# Patient Record
Sex: Female | Born: 1935 | Race: White | Hispanic: No | Smoking: Never smoker
Health system: Southern US, Community
[De-identification: ages and names within clinical notes are randomized; demographics above are authoritative.]

## PROBLEM LIST (undated history)

## (undated) DIAGNOSIS — G479 Sleep disorder, unspecified: Secondary | ICD-10-CM

## (undated) DIAGNOSIS — I10 Essential (primary) hypertension: Secondary | ICD-10-CM

## (undated) DIAGNOSIS — C50919 Malignant neoplasm of unspecified site of unspecified female breast: Secondary | ICD-10-CM

## (undated) DIAGNOSIS — Z923 Personal history of irradiation: Secondary | ICD-10-CM

## (undated) DIAGNOSIS — Z9221 Personal history of antineoplastic chemotherapy: Secondary | ICD-10-CM

## (undated) DIAGNOSIS — K219 Gastro-esophageal reflux disease without esophagitis: Secondary | ICD-10-CM

## (undated) DIAGNOSIS — F0280 Dementia in other diseases classified elsewhere without behavioral disturbance: Secondary | ICD-10-CM

## (undated) HISTORY — DX: Malignant neoplasm of unspecified site of unspecified female breast: C50.919

## (undated) HISTORY — DX: Gastro-esophageal reflux disease without esophagitis: K21.9

## (undated) HISTORY — DX: Dementia in other diseases classified elsewhere, unspecified severity, without behavioral disturbance, psychotic disturbance, mood disturbance, and anxiety: F02.80

## (undated) HISTORY — DX: Sleep disorder, unspecified: G47.9

## (undated) HISTORY — PX: BREAST EXCISIONAL BIOPSY: SUR124

## (undated) HISTORY — PX: BREAST BIOPSY: SHX20

---

## 2001-09-23 HISTORY — PX: BREAST EXCISIONAL BIOPSY: SUR124

## 2011-01-28 ENCOUNTER — Inpatient Hospital Stay: Payer: Self-pay | Admitting: Specialist

## 2011-02-05 DIAGNOSIS — I10 Essential (primary) hypertension: Secondary | ICD-10-CM

## 2011-02-05 DIAGNOSIS — M810 Age-related osteoporosis without current pathological fracture: Secondary | ICD-10-CM

## 2011-05-28 ENCOUNTER — Ambulatory Visit: Payer: Self-pay | Admitting: Internal Medicine

## 2012-01-20 ENCOUNTER — Ambulatory Visit: Payer: Self-pay | Admitting: Internal Medicine

## 2012-03-27 ENCOUNTER — Emergency Department: Payer: Self-pay | Admitting: *Deleted

## 2012-10-28 ENCOUNTER — Ambulatory Visit: Payer: Self-pay | Admitting: Hematology and Oncology

## 2012-10-28 LAB — COMPREHENSIVE METABOLIC PANEL
Albumin: 4 g/dL (ref 3.4–5.0)
Alkaline Phosphatase: 142 U/L — ABNORMAL HIGH (ref 50–136)
Anion Gap: 8 (ref 7–16)
BUN: 16 mg/dL (ref 7–18)
Bilirubin,Total: 0.4 mg/dL (ref 0.2–1.0)
Calcium, Total: 10 mg/dL (ref 8.5–10.1)
Chloride: 98 mmol/L (ref 98–107)
Co2: 33 mmol/L — ABNORMAL HIGH (ref 21–32)
Creatinine: 0.74 mg/dL (ref 0.60–1.30)
EGFR (African American): >60
EGFR (Non-African Amer.): >60
Glucose: 105 mg/dL — ABNORMAL HIGH (ref 65–99)
Osmolality: 279 (ref 275–301)
Potassium: 3.9 mmol/L (ref 3.5–5.1)
SGOT(AST): 26 U/L (ref 15–37)
SGPT (ALT): 28 U/L (ref 12–78)
Sodium: 139 mmol/L (ref 136–145)
Total Protein: 8.2 g/dL (ref 6.4–8.2)

## 2012-10-28 LAB — CBC CANCER CENTER
Basophil #: 0 x10 3/mm (ref 0.0–0.1)
Basophil %: 0.4 %
Eosinophil #: 0.2 x10 3/mm (ref 0.0–0.7)
Eosinophil %: 2.7 %
HCT: 41.4 % (ref 35.0–47.0)
HGB: 14.1 g/dL (ref 12.0–16.0)
Lymphocyte #: 1.4 x10 3/mm (ref 1.0–3.6)
Lymphocyte %: 23.9 %
MCH: 31.1 pg (ref 26.0–34.0)
MCHC: 33.9 g/dL (ref 32.0–36.0)
MCV: 92 fL (ref 80–100)
Monocyte #: 0.4 x10 3/mm (ref 0.2–0.9)
Monocyte %: 7.1 %
Neutrophil #: 3.8 x10 3/mm (ref 1.4–6.5)
Neutrophil %: 65.9 %
Platelet: 200 x10 3/mm (ref 150–440)
RBC: 4.52 10*6/uL (ref 3.80–5.20)
RDW: 13.6 % (ref 11.5–14.5)
WBC: 5.7 x10 3/mm (ref 3.6–11.0)

## 2012-11-03 ENCOUNTER — Ambulatory Visit: Payer: Self-pay | Admitting: Hematology and Oncology

## 2012-11-21 ENCOUNTER — Ambulatory Visit: Payer: Self-pay | Admitting: Hematology and Oncology

## 2013-01-20 ENCOUNTER — Ambulatory Visit: Payer: Self-pay | Admitting: Ophthalmology

## 2013-01-20 LAB — POTASSIUM: Potassium: 3.5 mmol/L (ref 3.5–5.1)

## 2013-02-02 ENCOUNTER — Ambulatory Visit: Payer: Self-pay | Admitting: Ophthalmology

## 2013-02-18 ENCOUNTER — Ambulatory Visit: Payer: Self-pay | Admitting: Ophthalmology

## 2013-02-18 LAB — POTASSIUM: Potassium: 3.4 mmol/L — ABNORMAL LOW (ref 3.5–5.1)

## 2013-03-02 ENCOUNTER — Ambulatory Visit: Payer: Self-pay | Admitting: Ophthalmology

## 2013-03-11 ENCOUNTER — Ambulatory Visit: Payer: Self-pay | Admitting: Internal Medicine

## 2014-04-06 ENCOUNTER — Ambulatory Visit: Payer: Self-pay | Admitting: Family Medicine

## 2015-01-13 NOTE — Op Note (Signed)
PATIENT NAME:  Stephanie Davidson, Stephanie Davidson MR#:  [id number] DATE OF BIRTH:  05-26-1931  DATE OF PROCEDURE:  02/02/2013  PREOPERATIVE DIAGNOSIS: Visually significant cataract of the left eye.   POSTOPERATIVE DIAGNOSIS: Visually significant cataract of the left eye.   OPERATIVE PROCEDURE: Cataract extraction by phacoemulsification with implant of intraocular lens to left eye.   SURGEON: Birder Robson, MD.   ANESTHESIA:  1. Managed anesthesia care.  2. Topical tetracaine drops followed by 2% Xylocaine jelly applied in the preoperative holding area.   COMPLICATIONS: None.   TECHNIQUE:  Stop and chop.    DESCRIPTION OF PROCEDURE: The patient was examined and consented in the preoperative holding area where the aforementioned topical anesthesia was applied to the left eye and then brought back to the Operating Room where the left eye was prepped and draped in the usual sterile ophthalmic fashion and a lid speculum was placed. A paracentesis was created with the side port blade and the anterior chamber was filled with viscoelastic. A near clear corneal incision was performed with the steel keratome. A continuous curvilinear capsulorrhexis was performed with a cystotome followed by the capsulorrhexis forceps. Hydrodissection and hydrodelineation were carried out with BSS on a blunt cannula. The lens was removed in a stop and chop technique and the remaining cortical material was removed with the irrigation-aspiration handpiece. The capsular bag was inflated with viscoelastic and the Tecnis ZCB00 22.5-diopter lens, serial number [id number], was placed in the capsular bag without complication. The remaining viscoelastic was removed from the eye with the irrigation-aspiration handpiece. The wounds were hydrated. The anterior chamber was flushed with Miostat and the eye was inflated to physiologic pressure. 0.1 mL of cefuroxime concentration 10 mg/mL was placed in the anterior chamber. The wounds were found to be water  tight. The eye was dressed with Vigamox. The patient was given protective glasses to wear throughout the day and a shield with which to sleep tonight. The patient was also given drops with which to begin a drop regimen today and will follow-up with me in one day.    ____________________________ Livingston Diones. Noheli Melder, MD wlp:gb D: 02/02/2013 20:55:08 ET T: 02/02/2013 21:34:07 ET JOB#: [id number]  cc: Zenita Kister L. Cleda Imel, MD, <Dictator> Livingston Diones Francess Mullen MD ELECTRONICALLY SIGNED 02/05/2013 9:00

## 2015-01-13 NOTE — Op Note (Signed)
PATIENT NAME:  Stephanie Davidson, Stephanie Davidson MR#:  [id number] DATE OF BIRTH:  10-15-1930  DATE OF PROCEDURE:  03/02/2013  PREOPERATIVE DIAGNOSIS: Visually significant cataract of the right eye.   POSTOPERATIVE DIAGNOSIS: Visually significant cataract of the right eye.   OPERATIVE PROCEDURE: Cataract extraction by phacoemulsification with implant of intraocular lens to right eye.   SURGEON: Birder Robson, MD.   ANESTHESIA:  1. Managed anesthesia care.  2. Topical tetracaine drops followed by 2% Xylocaine jelly applied in the preoperative holding area.   COMPLICATIONS: None.   TECHNIQUE: Stop and chop.  DESCRIPTION OF PROCEDURE: The patient was examined and consented in the preoperative holding area where the aforementioned topical anesthesia was applied to the right eye and then brought back to the Operating Room where the right eye was prepped and draped in the usual sterile ophthalmic fashion and a lid speculum was placed. A paracentesis was created with the side port blade and the anterior chamber was filled with viscoelastic. A near clear corneal incision was performed with the steel keratome. A continuous curvilinear capsulorrhexis was performed with a cystotome followed by the capsulorrhexis forceps. Hydrodissection and hydrodelineation were carried out with BSS on a blunt cannula. The lens was removed in a stop and chop technique and the remaining cortical material was removed with the irrigation-aspiration handpiece. The capsular bag was inflated with viscoelastic and the Tecnis ZCB00 22.0-diopter lens, serial number [id number], was placed in the capsular bag without complication. The remaining viscoelastic was removed from the eye with the irrigation-aspiration handpiece. The wounds were hydrated. The anterior chamber was flushed with Miostat and the eye was inflated to physiologic pressure. 0.1 mL of cefuroxime concentration 10 mg/mL was placed in the anterior chamber. The wounds were found to be  water tight. The eye was dressed with Vigamox. The patient was given protective glasses to wear throughout the day and a shield with which to sleep tonight. The patient was also given drops with which to begin a drop regimen today and will follow-up with me in one day.    ____________________________ Livingston Diones. Thoms Barthelemy, MD wlp:jm D: 03/02/2013 16:34:22 ET T: 03/02/2013 19:59:36 ET JOB#: [id number]  cc: Coady Train L. Aurianna Earlywine, MD, <Dictator> Livingston Diones Aedon Deason MD ELECTRONICALLY SIGNED 03/03/2013 12:50

## 2015-10-16 ENCOUNTER — Emergency Department: Payer: Medicare Other

## 2015-10-16 ENCOUNTER — Encounter: Payer: Self-pay | Admitting: Emergency Medicine

## 2015-10-16 ENCOUNTER — Emergency Department
Admission: EM | Admit: 2015-10-16 | Discharge: 2015-10-16 | Disposition: A | Discharge status: Home / Self Care | Payer: Medicare Other | Attending: Emergency Medicine | Admitting: Emergency Medicine

## 2015-10-16 DIAGNOSIS — S0181XA Laceration without foreign body of other part of head, initial encounter: Secondary | ICD-10-CM | POA: Diagnosis present

## 2015-10-16 DIAGNOSIS — S0083XA Contusion of other part of head, initial encounter: Secondary | ICD-10-CM

## 2015-10-16 DIAGNOSIS — I10 Essential (primary) hypertension: Secondary | ICD-10-CM | POA: Insufficient documentation

## 2015-10-16 DIAGNOSIS — Y9389 Activity, other specified: Secondary | ICD-10-CM | POA: Diagnosis not present

## 2015-10-16 DIAGNOSIS — Y998 Other external cause status: Secondary | ICD-10-CM | POA: Diagnosis not present

## 2015-10-16 DIAGNOSIS — W19XXXA Unspecified fall, initial encounter: Secondary | ICD-10-CM

## 2015-10-16 DIAGNOSIS — S80211A Abrasion, right knee, initial encounter: Secondary | ICD-10-CM | POA: Insufficient documentation

## 2015-10-16 DIAGNOSIS — Y92009 Unspecified place in unspecified non-institutional (private) residence as the place of occurrence of the external cause: Secondary | ICD-10-CM | POA: Diagnosis not present

## 2015-10-16 HISTORY — DX: Essential (primary) hypertension: I10

## 2015-10-16 MED ORDER — LIDOCAINE HCL (PF) 1 % IJ SOLN
5.0000 mL | Freq: Once | INTRAMUSCULAR | Status: AC
  Administered 2015-10-16: 5 mL

## 2015-10-16 MED ORDER — BACITRACIN ZINC 500 UNIT/GM EX OINT
TOPICAL_OINTMENT | Freq: Every day | CUTANEOUS | Status: DC
Start: 2015-10-16 — End: 2015-10-17
  Administered 2015-10-16: 1 via TOPICAL

## 2015-10-16 NOTE — ED Notes (Signed)
[place]  Having pain to right shoulder knee and face

## 2015-10-16 NOTE — ED Provider Notes (Signed)
[facility] Emergency Department Provider Note ____________________________________________  Time seen: 2013  I have reviewed the triage vital signs and the nursing notes.  HISTORY  Chief Complaint  Fall   HPI Stephanie Davidson is a 82 y.o. female reports to the ED with complaints of injuries sustained due to a fall at home today. The patient tripped and fell while getting out of the car today with her sister. She fell primarily to the right side, causing injuries to her face. She describes falling "flat on her face. She denies loss of consciousness, nosebleed, or dental injury. She notes some abrasion to her right knee, bruising over her left eyelid, and lacerations over both eyebrows. She denies any significant pain related to her injuries. She also denies any visual disturbance or nausea or vomiting.  Past Medical History  Diagnosis Date  . Hypertension    There are no active problems to display for this patient.  No past surgical history on file.  No current outpatient prescriptions on file.  Allergies Review of patient's allergies indicates no known allergies.  No family history on file.  Social History Social History  Substance Use Topics  . Smoking status: Never Smoker   . Smokeless tobacco: None  . Alcohol Use: No   Review of Systems  Constitutional: Negative for fever. Eyes: Negative for visual changes. ENT: Negative for sore throat. Cardiovascular: Negative for chest pain. Respiratory: Negative for shortness of breath. Gastrointestinal: Negative for abdominal pain, vomiting and diarrhea. Genitourinary: Negative for dysuria. Musculoskeletal: Negative for back pain. Skin: Negative for rash. Multiple abrasions and lacerations as above Neurological: Negative for headaches, focal weakness or numbness. ____________________________________________  PHYSICAL EXAM:  VITAL SIGNS: ED Triage Vitals  Enc Vitals Group     BP 10/16/15 1732 172/70  mmHg     Pulse Rate 10/16/15 1732 105     Resp 10/16/15 1732 16     Temp 10/16/15 1732 98.1 F (36.7 C)     Temp Source 10/16/15 1732 Oral     SpO2 10/16/15 1732 96 %     Weight 10/16/15 1732 109 lb (49.442 kg)     Height 10/16/15 1732 5\' 1"  (1.549 m)     Head Cir --      Peak Flow --      Pain Score 10/16/15 1733 0     Pain Loc --      Pain Edu? --      Excl. in [place]? --    Constitutional: Alert and oriented. Well appearing and in no distress. Head: Normocephalic and atraumatic. Patient with left upper lid hematoma and ecchymosis noted. Linear lacerations noted over the left and right brows. Some mild abrasion noted over the right cheek and nasal bridge.       Eyes: Conjunctivae are normal. PERRL. Normal extraocular movements      Ears: Canals clear. TMs intact bilaterally.   Nose: No congestion/rhinorrhea.   Mouth/Throat: Mucous membranes are moist.   Neck: Supple. No thyromegaly. Hematological/Lymphatic/Immunological: No cervical lymphadenopathy. Cardiovascular: Normal rate, regular rhythm.  Respiratory: Normal respiratory effort. No wheezes/rales/rhonchi. Gastrointestinal: Soft and nontender. No distention. Musculoskeletal: Right knee with a superficial abrasion. Normal knee exam on the right. Nontender with normal range of motion in all extremities.  Neurologic: CN II-XII grossly intact. Normal gait without ataxia. Normal speech and language. No gross focal neurologic deficits are appreciated. Skin:  Skin is warm, dry and intact. No rash noted. Psychiatric: Mood and affect are normal. Patient exhibits appropriate insight  and judgment. ____________________________________________   RADIOLOGY CT Maxillofacial/Head/C-spine IMPRESSION: 1. No acute intracranial abnormality. Atrophy with chronic small vessel white matter ischemic demyelination. 2. No evidence for acute fracture involving the bony anatomy of the face. 3. Degenerative changes in the cervical spine without  evidence for Fracture. ____________________________________________  PROCEDURES  LACERATION REPAIR Performed by: Tereasa Coop, PA-S Tyler Deis) Supervised by: Melvenia Needles Consent: Verbal consent obtained. Risks and benefits: risks, benefits and alternatives were discussed Consent given by: patient Patient identity confirmed: provided demographic data Prepped and Draped in normal sterile fashion Wound explored  Laceration Location: Right & left brows  Laceration Length: 1 cm & 1 cm  No Foreign Bodies seen or palpated  Anesthesia: local infiltration  Local anesthetic: lidocaine 1% w/o epinephrine  Anesthetic total: 4 ml  Irrigation method: syringe Amount of cleaning: standard  Skin closure: 5-0 Nylon  Number of sutures: 4 right & 3 left   Technique: interrupted  Patient tolerance: Patient tolerated the procedure well with no immediate complications. ____________________________________________  INITIAL IMPRESSION / ASSESSMENT AND PLAN / ED COURSE  Patient with mechanical fall causing injury to the face, and knee. She noted to have multiple abrasions to the face and 2 distinct lacerations requiring suture repair over the brows. Patient is reassured following her negative CT scans of the head, neck, and face. She is discharged with wound care instructions, and encouraged up with her primary care provider for suture removal in 3-5 days. ____________________________________________  FINAL CLINICAL IMPRESSION(S) / ED DIAGNOSES  Final diagnoses:  Fall at home, initial encounter  Facial laceration, initial encounter  Facial contusion, initial encounter     Melvenia Needles, PA-C 10/17/15 2241  Harvest Dark, MD 10/20/15 [phone]

## 2015-10-16 NOTE — Discharge Instructions (Signed)
Facial Laceration A facial laceration is a cut on the face. These injuries can be painful and cause bleeding. Some cuts may need to be closed with stitches (sutures), skin adhesive strips, or wound glue. Cuts usually heal quickly but can leave a scar. It can take 1-2 years for the scar to go away completely. HOME CARE   Only take medicines as told by your doctor.  Follow your doctor's instructions for wound care. For Stitches:  Keep the cut clean and dry.  If you have a bandage (dressing), change it at least once a day. Change the bandage if it gets wet or dirty, or as told by your doctor.  Wash the cut with soap and water 2 times a day. Rinse the cut with water. Pat it dry with a clean towel.  Put a thin layer of medicated cream on the cut as told by your doctor.  You may shower after the first 24 hours. Do not soak the cut in water until the stitches are removed.  Have your stitches removed as told by your doctor.  Do not wear any makeup until a few days after your stitches are removed. For Skin Adhesive Strips:  Keep the cut clean and dry.  Do not get the strips wet. You may take a bath, but be careful to keep the cut dry.  If the cut gets wet, pat it dry with a clean towel.  The strips will fall off on their own. Do not remove the strips that are still stuck to the cut. For Wound Glue:  You may shower or take baths. Do not soak or scrub the cut. Do not swim. Avoid heavy sweating until the glue falls off on its own. After a shower or bath, pat the cut dry with a clean towel.  Do not put medicine or makeup on your cut until the glue falls off.  If you have a bandage, do not put tape over the glue.  Avoid lots of sunlight or tanning lamps until the glue falls off.  The glue will fall off on its own in 5-10 days. Do not pick at the glue. After Healing:  Put sunscreen on the cut for the first year to reduce your scar. GET HELP IF:  You have a fever. GET HELP RIGHT AWAY  IF:   Your cut area gets red, painful, or puffy (swollen).  You see a yellowish-white fluid (pus) coming from the cut.   This information is not intended to replace advice given to you by your health care provider. Make sure you discuss any questions you have with your health care provider.   Document Released: 02/26/2008 Document Revised: 09/30/2014 Document Reviewed: 04/22/2013 Elsevier Interactive Patient Education 2016 [place].  Facial or Scalp Contusion A facial or scalp contusion is a deep bruise on the face or head. Injuries to the face and head generally cause a lot of swelling, especially around the eyes. Contusions are the result of an injury that caused bleeding under the skin. The contusion may turn blue, purple, or yellow. Minor injuries will give you a painless contusion, but more severe contusions may stay painful and swollen for a few weeks.  CAUSES  A facial or scalp contusion is caused by a blunt injury or trauma to the face or head area.  SIGNS AND SYMPTOMS   Swelling of the injured area.   Discoloration of the injured area.   Tenderness, soreness, or pain in the injured area.  DIAGNOSIS  The  diagnosis can be made by taking a medical history and doing a physical exam. An X-ray exam, CT scan, or MRI may be needed to determine if there are any associated injuries, such as broken bones (fractures). TREATMENT  Often, the best treatment for a facial or scalp contusion is applying cold compresses to the injured area. Over-the-counter medicines may also be recommended for pain control.  HOME CARE INSTRUCTIONS   Only take over-the-counter or prescription medicines as directed by your health care provider.   Apply ice to the injured area.   Put ice in a plastic bag.   Place a towel between your skin and the bag.   Leave the ice on for 20 minutes, 2-3 times a day.  SEEK MEDICAL CARE IF:  You have bite problems.   You have pain with chewing.   You  are concerned about facial defects. SEEK IMMEDIATE MEDICAL CARE IF:  You have severe pain or a headache that is not relieved by medicine.   You have unusual sleepiness, confusion, or personality changes.   You throw up (vomit).   You have a persistent nosebleed.   You have double vision or blurred vision.   You have fluid drainage from your nose or ear.   You have difficulty walking or using your arms or legs.  MAKE SURE YOU:   Understand these instructions.  Will watch your condition.  Will get help right away if you are not doing well or get worse.   This information is not intended to replace advice given to you by your health care provider. Make sure you discuss any questions you have with your health care provider.   Document Released: 10/17/2004 Document Revised: 09/30/2014 Document Reviewed: 04/22/2013 Elsevier Interactive Patient Education 2016 [place], or Adhesive Wound Closure Doctors use stitches (sutures), staples, and certain glue (skin adhesives) to hold your skin together while it heals (wound closure). You may need this treatment after you have surgery or if you cut your skin accidentally. These methods help your skin heal more quickly. They also make it less likely that you will have a scar. WHAT ARE THE DIFFERENT KINDS OF WOUND CLOSURES? There are many options for wound closure. The one that your doctor uses depends on how deep and large your wound is. Adhesive Glue To use this glue to close a wound, your doctor holds the edges of the wound together and paints the glue on the surface of your skin. You may need more than one layer of glue. Then the wound may be covered with a light bandage (dressing). This type of skin closure may be used for small wounds that are not deep (superficial). Using glue for wound closure is less painful than other methods. It does not require a medicine that numbs the area. This method also leaves  nothing to be removed. Adhesive glue is often used for children and on facial wounds. Adhesive glue cannot be used for wounds that are deep, uneven, or bleeding. It is not used inside of a wound.  Adhesive Strips These strips are made of sticky (adhesive), porous paper. They are placed across your skin edges like a regular adhesive bandage. You leave them on until they fall off. Adhesive strips may be used to close very superficial wounds. They may also be used along with sutures to improve closure of your skin edges.  Sutures Sutures are the oldest method of wound closure. Sutures can be made from natural or synthetic materials. They  can be made from a material that your body can break down as your wound heals (absorbable), or they can be made from a material that needs to be removed from your skin (nonabsorbable). They come in many different strengths and sizes. Your doctor attaches the sutures to a steel needle on one end. Sutures can be passed through your skin, or through the tissues beneath your skin. Then they are tied and cut. Your skin edges may be closed in one continuous stitch or in separate stitches. Sutures are strong and can be used for all kinds of wounds. Absorbable sutures may be used to close tissues under the skin. The disadvantage of sutures is that they may cause skin reactions that lead to infection. Nonabsorbable sutures need to be removed. Staples When surgical staples are used to close a wound, the edges of your skin on both sides of the wound are brought close together. A staple is placed across the wound, and an instrument secures the edges together. Staples are often used to close surgical cuts (incisions). Staples are faster to use than sutures, and they cause less reaction from your skin. Staples need to be removed using a tool that bends the staples away from your skin. HOW DO I CARE FOR MY WOUND CLOSURE?  Take medicines only as told by your doctor.  If you were  prescribed an antibiotic medicine for your wound, finish it all even if you start to feel better.  Use ointments or creams only as told by your doctor.  Wash your hands with soap and water before and after touching your wound.  Do not soak your wound in water. Do not take baths, swim, or use a hot tub until your doctor says it is okay.  Ask your doctor when you can start showering. Cover your wound if told by your doctor.  Do not take out your own sutures or staples.  Do not pick at your wound. Picking can cause an infection.  Keep all follow-up visits as told by your doctor. This is important. HOW LONG WILL I HAVE MY WOUND CLOSURE?   Leave adhesive glue on your skin until the glue peels away.  Leave adhesive strips on your skin until they fall off.  Absorbable sutures will dissolve within several days.  Nonabsorbable sutures and staples must be removed. The location of the wound will determine how long they stay in. This can range from several days to a couple of weeks. WHEN SHOULD I SEEK HELP FOR MY WOUND CLOSURE? Contact your doctor if:  You have a fever.  You have chills.  You have redness, puffiness (swelling), or pain at the site of your wound.  You have fluid, blood, or pus coming from your wound.  There is a bad smell coming from your wound.  The skin edges of your wound start to separate after your sutures have been removed.  Your wound becomes thick, raised, and darker in color after your sutures come out (scarring).   This information is not intended to replace advice given to you by your health care provider. Make sure you discuss any questions you have with your health care provider.   Document Released: 07/07/2009 Document Revised: 09/30/2014 Document Reviewed: 02/16/2014 Elsevier Interactive Patient Education 2016 [place] the wounds clean, dry, and covered as needed. Apply OTC antibiotic ointment for scrapes and abrasions. See your provider on  Friday for suture removal. Take OTC Tylenol or Motrin for pain as needed.

## 2016-04-02 ENCOUNTER — Other Ambulatory Visit: Payer: Self-pay | Admitting: Family Medicine

## 2016-04-02 DIAGNOSIS — Z1231 Encounter for screening mammogram for malignant neoplasm of breast: Secondary | ICD-10-CM

## 2016-04-19 ENCOUNTER — Ambulatory Visit
Admission: RE | Admit: 2016-04-19 | Discharge: 2016-04-19 | Disposition: A | Discharge status: Home / Self Care | Payer: Medicare Other | Source: Ambulatory Visit | Attending: Family Medicine | Admitting: Family Medicine

## 2016-04-19 DIAGNOSIS — Z1231 Encounter for screening mammogram for malignant neoplasm of breast: Secondary | ICD-10-CM | POA: Insufficient documentation

## 2017-05-21 ENCOUNTER — Other Ambulatory Visit: Payer: Self-pay | Admitting: Family Medicine

## 2017-05-21 DIAGNOSIS — Z1231 Encounter for screening mammogram for malignant neoplasm of breast: Secondary | ICD-10-CM

## 2017-06-27 ENCOUNTER — Ambulatory Visit
Admission: RE | Admit: 2017-06-27 | Discharge: 2017-06-27 | Disposition: A | Discharge status: Home / Self Care | Payer: Medicare Other | Source: Ambulatory Visit | Attending: Family Medicine | Admitting: Family Medicine

## 2017-06-27 DIAGNOSIS — Z1231 Encounter for screening mammogram for malignant neoplasm of breast: Secondary | ICD-10-CM | POA: Insufficient documentation

## 2019-04-20 ENCOUNTER — Other Ambulatory Visit: Payer: Self-pay | Admitting: Student

## 2019-04-20 DIAGNOSIS — N6322 Unspecified lump in the left breast, upper inner quadrant: Secondary | ICD-10-CM

## 2019-04-28 ENCOUNTER — Ambulatory Visit
Admission: RE | Admit: 2019-04-28 | Discharge: 2019-04-28 | Disposition: A | Discharge status: Home / Self Care | Payer: Medicare Other | Source: Ambulatory Visit | Attending: Student | Admitting: Student

## 2019-04-28 DIAGNOSIS — N6322 Unspecified lump in the left breast, upper inner quadrant: Secondary | ICD-10-CM | POA: Insufficient documentation

## 2019-04-28 HISTORY — DX: Personal history of antineoplastic chemotherapy: Z92.21

## 2019-04-28 HISTORY — DX: Personal history of irradiation: Z92.3

## 2019-05-03 ENCOUNTER — Other Ambulatory Visit: Payer: Self-pay | Admitting: Student

## 2019-05-03 DIAGNOSIS — N6320 Unspecified lump in the left breast, unspecified quadrant: Secondary | ICD-10-CM

## 2019-05-03 DIAGNOSIS — R928 Other abnormal and inconclusive findings on diagnostic imaging of breast: Secondary | ICD-10-CM

## 2019-05-12 ENCOUNTER — Ambulatory Visit
Admission: RE | Admit: 2019-05-12 | Discharge: 2019-05-12 | Disposition: A | Discharge status: Home / Self Care | Payer: Medicare Other | Source: Ambulatory Visit | Attending: Student | Admitting: Student

## 2019-05-12 ENCOUNTER — Other Ambulatory Visit: Payer: Self-pay

## 2019-05-12 DIAGNOSIS — N6320 Unspecified lump in the left breast, unspecified quadrant: Secondary | ICD-10-CM

## 2019-05-12 DIAGNOSIS — C8339 Diffuse large B-cell lymphoma, extranodal and solid organ sites: Secondary | ICD-10-CM | POA: Insufficient documentation

## 2019-05-12 DIAGNOSIS — N6322 Unspecified lump in the left breast, upper inner quadrant: Secondary | ICD-10-CM | POA: Diagnosis not present

## 2019-05-12 DIAGNOSIS — R928 Other abnormal and inconclusive findings on diagnostic imaging of breast: Secondary | ICD-10-CM

## 2019-05-12 HISTORY — PX: BREAST BIOPSY: SHX20

## 2019-05-24 ENCOUNTER — Inpatient Hospital Stay: Payer: Medicare Other | Admitting: Internal Medicine

## 2019-05-25 ENCOUNTER — Encounter: Payer: Self-pay | Admitting: Internal Medicine

## 2019-05-25 DIAGNOSIS — C8599 Non-Hodgkin lymphoma, unspecified, extranodal and solid organ sites: Secondary | ICD-10-CM | POA: Insufficient documentation

## 2019-05-26 ENCOUNTER — Encounter: Payer: Self-pay | Admitting: Internal Medicine

## 2019-05-26 ENCOUNTER — Telehealth: Payer: Self-pay | Admitting: *Deleted

## 2019-05-26 ENCOUNTER — Other Ambulatory Visit: Payer: Self-pay

## 2019-05-26 ENCOUNTER — Inpatient Hospital Stay: Payer: Medicare Other

## 2019-05-26 ENCOUNTER — Inpatient Hospital Stay: Payer: Medicare Other | Attending: Internal Medicine | Admitting: Internal Medicine

## 2019-05-26 VITALS — BP 135/75 | HR 79 | Temp 98.5°F | Resp 20 | Ht 61.0 in | Wt 108.0 lb

## 2019-05-26 DIAGNOSIS — N6322 Unspecified lump in the left breast, upper inner quadrant: Secondary | ICD-10-CM | POA: Diagnosis not present

## 2019-05-26 DIAGNOSIS — Z79899 Other long term (current) drug therapy: Secondary | ICD-10-CM | POA: Insufficient documentation

## 2019-05-26 DIAGNOSIS — Z853 Personal history of malignant neoplasm of breast: Secondary | ICD-10-CM | POA: Insufficient documentation

## 2019-05-26 DIAGNOSIS — Z923 Personal history of irradiation: Secondary | ICD-10-CM | POA: Insufficient documentation

## 2019-05-26 DIAGNOSIS — C8599 Non-Hodgkin lymphoma, unspecified, extranodal and solid organ sites: Secondary | ICD-10-CM

## 2019-05-26 DIAGNOSIS — Z5111 Encounter for antineoplastic chemotherapy: Secondary | ICD-10-CM | POA: Diagnosis not present

## 2019-05-26 DIAGNOSIS — C8330 Diffuse large B-cell lymphoma, unspecified site: Secondary | ICD-10-CM | POA: Insufficient documentation

## 2019-05-26 DIAGNOSIS — Z9221 Personal history of antineoplastic chemotherapy: Secondary | ICD-10-CM | POA: Diagnosis not present

## 2019-05-26 DIAGNOSIS — G309 Alzheimer's disease, unspecified: Secondary | ICD-10-CM | POA: Insufficient documentation

## 2019-05-26 LAB — CBC WITH DIFFERENTIAL/PLATELET
Abs Immature Granulocytes: 0.02 10*3/uL (ref 0.00–0.07)
Basophils Absolute: 0 10*3/uL (ref 0.0–0.1)
Basophils Relative: 0 %
Eosinophils Absolute: 0.2 10*3/uL (ref 0.0–0.5)
Eosinophils Relative: 2 %
HCT: 41 % (ref 36.0–46.0)
Hemoglobin: 13.5 g/dL (ref 12.0–15.0)
Immature Granulocytes: 0 %
Lymphocytes Relative: 26 %
Lymphs Abs: 1.8 10*3/uL (ref 0.7–4.0)
MCH: 29.9 pg (ref 26.0–34.0)
MCHC: 32.9 g/dL (ref 30.0–36.0)
MCV: 90.7 fL (ref 80.0–100.0)
Monocytes Absolute: 0.4 10*3/uL (ref 0.1–1.0)
Monocytes Relative: 5 %
Neutro Abs: 4.7 10*3/uL (ref 1.7–7.7)
Neutrophils Relative %: 67 %
Platelets: 170 10*3/uL (ref 150–400)
RBC: 4.52 MIL/uL (ref 3.87–5.11)
RDW: 13.2 % (ref 11.5–15.5)
WBC: 7.1 10*3/uL (ref 4.0–10.5)
nRBC: 0 % (ref 0.0–0.2)

## 2019-05-26 LAB — COMPREHENSIVE METABOLIC PANEL
ALT: 21 U/L (ref 0–44)
AST: 29 U/L (ref 15–41)
Albumin: 4.9 g/dL (ref 3.5–5.0)
Alkaline Phosphatase: 118 U/L (ref 38–126)
Anion gap: 8 (ref 5–15)
BUN: 25 mg/dL — ABNORMAL HIGH (ref 8–23)
CO2: 29 mmol/L (ref 22–32)
Calcium: 10.3 mg/dL (ref 8.9–10.3)
Chloride: 99 mmol/L (ref 98–111)
Creatinine, Ser: 0.82 mg/dL (ref 0.44–1.00)
GFR calc Af Amer: >60 mL/min (ref >60)
GFR calc non Af Amer: >60 mL/min (ref >60)
Glucose, Bld: 108 mg/dL — ABNORMAL HIGH (ref 70–99)
Potassium: 3.9 mmol/L (ref 3.5–5.1)
Sodium: 136 mmol/L (ref 135–145)
Total Bilirubin: 0.6 mg/dL (ref 0.3–1.2)
Total Protein: 8.5 g/dL — ABNORMAL HIGH (ref 6.5–8.1)

## 2019-05-26 LAB — LACTATE DEHYDROGENASE: LDH: 294 U/L — ABNORMAL HIGH (ref 98–192)

## 2019-05-26 NOTE — Assessment & Plan Note (Addendum)
#  Diffuse large B-cell lymphoma left breast [history of similar diagnosis 2004; [facility]].  I think this is new primary.   #Non-GCB subtype; double expresser; FISH studies pending.  Discussed that she has an aggressive lymphoma that needs fairly urgent treatment.  Recommend further work-up including a PET scan.  Bone marrow biopsy if needed.  # Discussed in general goal for therapy in lymphoma is cure.  However if patient/family felt treatments would be appropriate to high risk of side effects; palliative options were also discussed.   # Discussed R-CHOP chemotherapy every 3 weeks; patient will need growth factor support.  Number of cycles of R-CHOP-3 versus 6; additional radiation will depend upon the stage of lymphoma.  Discussed the potential side effects including but not limited to-increasing fatigue, nausea vomiting, diarrhea, hair loss, sores in the mouth, increase risk of infection and also neuropathy. Also discussed regarding potential side effects of heart failure/leukemia with Adriamycin.  2D echo ordered.  #Patient likely received Adriamycin as part of R-CHOP 16 years ago.  Await 2D echo/will have to switch to etoposide based chemotherapy given her age/prior history of Adriamycin.   #Question Alzheimer's-mild dementia.  However patient seems to be her functional at this time.  Will discuss with the patient's primary care Dr. Baldemar Lenis.  Thank you Dr.Babaoff for allowing me to participate in the care of your pleasant patient. Please do not hesitate to contact me with questions or concerns in the interim.  # 60 minutes face-to-face with the patient discussing the above plan of care; more than 50% of time spent on prognosis/ natural history; counseling and coordination.  Also spoke to patient's daughter/son [over the phone; live about 1 to 2 hours away].  They agree with the treatment plan.  # DISPOSITION: # labs today # port referral to IR # PET ASAP # 2 D echo asap # Chemo education- R-CHOP  in 1 week # follow up MD-in 1 week.   Cc; Dr.Babaoff

## 2019-05-26 NOTE — Telephone Encounter (Signed)
[facility] oncology. Left msg for HIM dept to return my phone call. Dr. Rogue Bussing would like to obtain records (office notes from oncology office to determine her previous chemo regimen). Path and radiology reports are only visible in epic (not the notes)

## 2019-05-26 NOTE — Progress Notes (Signed)
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 R-CHOP x 3 cycles- RT; [facility]  # ?mild dementia   Lymphoma of breast ([place])  05/25/2019 Initial Diagnosis   Lymphoma of breast ([place])      HISTORY OF PRESENTING ILLNESS:  Stephanie Davidson 85 y.o.  female with prior history of left breast developed B-cell lymphoma 2006; is here for new diagnosis of lymphoma of the left breast.  Patient notes to have a lump in the left breast for about 1 to 2 months.  Progressive getting bigger.  She went on to have a ultrasound/mammogram-and finally biopsy that confirmed above diagnosis of lymphoma/pathology as above.  She denies any pain.  Denies any night sweats.  No fevers.  Denies any new lumps or bumps.  She lost about 10 pounds in the last 6 months.  As per the family she has been diagnosed with mild dementia.  She is otherwise high functional; living with herself doing her daily chores driving etc.  Review of Systems  Constitutional: Positive for weight loss. Negative for chills, diaphoresis, fever and malaise/fatigue.  HENT: Negative for nosebleeds and sore throat.   Eyes: Negative for double vision.  Respiratory: Negative for cough, hemoptysis, sputum production, shortness of breath and wheezing.   Cardiovascular: Negative for chest pain, palpitations, orthopnea and leg swelling.  Gastrointestinal: Negative for abdominal pain, blood in stool, constipation, diarrhea, heartburn, melena, nausea and vomiting.  Genitourinary: Negative for dysuria, frequency and urgency.  Musculoskeletal: Positive for back pain and joint pain.  Skin: Negative.  Negative for itching and rash.  Neurological: Negative for dizziness,  tingling, focal weakness, weakness and headaches.  Endo/Heme/Allergies: Does not bruise/bleed easily.  Psychiatric/Behavioral: Positive for memory loss. Negative for depression. The patient is not nervous/anxious and does not have insomnia.      MEDICAL HISTORY:  Past Medical History:  Diagnosis Date  . Breast cancer ([place])   . Hypertension   . Personal history of chemotherapy   . Personal history of radiation therapy     SURGICAL HISTORY: Past Surgical History:  Procedure Laterality Date  . BREAST BIOPSY Right ?   needle bx-benign  . BREAST BIOPSY Left 05/12/2019   [place] bx 12:00, venus marker, path pending  . BREAST EXCISIONAL BIOPSY Left 2003   lymphoma  . BREAST EXCISIONAL BIOPSY Left ?   lump was removed, benign    SOCIAL HISTORY: Social History   Socioeconomic History  . Marital status: Single    Spouse name: Not on file  . Number of children: Not on file  . Years of education: Not on file  . Highest education level: Not on file  Occupational History  . Not on file  Social Needs  . Financial resource strain: Not on file  . Food insecurity    Worry: Not on file    Inability: Not on file  . Transportation needs    Medical: Not on file    Non-medical: Not on file  Tobacco Use  . Smoking status: Never Smoker  . Smokeless tobacco: Never Used  Substance and Sexual Activity  . Alcohol use: No  . Drug use: Not on file  . Sexual activity: Not on file  Lifestyle  . Physical activity  Days per week: Not on file    Minutes per session: Not on file  . Stress: Not on file  Relationships  . Social Herbalist on phone: Not on file    Gets together: Not on file    Attends religious service: Not on file    Active member of club or organization: Not on file    Attends meetings of clubs or organizations: Not on file    Relationship status: Not on file  . Intimate partner violence    Fear of current or ex partner: Not on file    Emotionally abused: Not on  file    Physically abused: Not on file    Forced sexual activity: Not on file  Other Topics Concern  . Not on file  Social History Narrative   [place]; own apartment; no smoking; no alcohol. 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     FAMILY HISTORY: Family History  Problem Relation Age of Onset  . Breast cancer Neg Hx     ALLERGIES:  has No Known Allergies.  MEDICATIONS:  Current Outpatient Medications  Medication Sig Dispense Refill  . amLODipine (NORVASC) 10 MG tablet Take 1 tablet by mouth daily.    . Calcium Carbonate-Vitamin D (CALTRATE 600+D PO) Take 1 tablet by mouth 2 (two) times daily.    Marland Kitchen docusate calcium (SURFAK) 240 MG capsule Take 1 capsule by mouth 2 (two) times daily.    Marland Kitchen donepezil (ARICEPT) 10 MG tablet Take 1 tablet by mouth at bedtime.    . fluticasone (FLONASE) 50 MCG/ACT nasal spray Place 2 sprays into both nostrils daily.    . hydrochlorothiazide (HYDRODIURIL) 25 MG tablet Take 1 tablet by mouth daily.    . irbesartan (AVAPRO) 300 MG tablet Take 1 tablet by mouth daily.    . meloxicam (MOBIC) 15 MG tablet Take 1 tablet by mouth as needed.    . Multiple Vitamin (MULTI-VITAMIN) tablet Take 1 tablet by mouth daily.    . Omega-3 Fatty Acids (FISH OIL) 1000 MG CAPS Take 1 tablet by mouth daily.    Marland Kitchen omeprazole (PRILOSEC) 20 MG capsule Take 1 capsule by mouth daily.    . traZODone (DESYREL) 50 MG tablet Take 1 tablet by mouth 2 (two) times daily at 8 am and 10 pm.     No current facility-administered medications for this visit.       Marland Kitchen  PHYSICAL EXAMINATION: ECOG PERFORMANCE STATUS: 1 - Symptomatic but completely ambulatory  Vitals:   05/26/19 1108  BP: 135/75  Pulse: 79  Resp: 20  Temp: 98.5 F (36.9 C)   Filed Weights   05/26/19 1108  Weight: 108 lb (49 kg)    Physical Exam  Constitutional: She is oriented to person, place, and time and well-developed, well-nourished, and in no distress.  HENT:  Head: Normocephalic and atraumatic.  Mouth/Throat: Oropharynx is  clear and moist. No oropharyngeal exudate.  Eyes: Pupils are equal, round, and reactive to light.  Neck: Normal range of motion. Neck supple.  Cardiovascular: Normal rate and regular rhythm.  Pulmonary/Chest: Effort normal and breath sounds normal. No respiratory distress. She has no wheezes.  Abdominal: Soft. Bowel sounds are normal. She exhibits no distension and no mass. There is no abdominal tenderness. There is no rebound and no guarding.  Musculoskeletal: Normal range of motion.        General: No tenderness or edema.  Neurological: She is alert and oriented to person, place, and time.  Skin: Skin  is warm.  Left breast upper inner quadrant-5.5 x 4.5 cm mobile mass noted.  Psychiatric: Affect normal.     LABORATORY DATA:  I have reviewed the data as listed Lab Results  Component Value Date   WBC 7.1 05/26/2019   HGB 13.5 05/26/2019   HCT 41.0 05/26/2019   MCV 90.7 05/26/2019   PLT 170 05/26/2019   Recent Labs    05/26/19 1237  NA 136  K 3.9  CL 99  CO2 29  GLUCOSE 108*  BUN 25*  CREATININE 0.82  CALCIUM 10.3  GFRNONAA >60  GFRAA >60  PROT 8.5*  ALBUMIN 4.9  AST 29  ALT 21  ALKPHOS 118  BILITOT 0.6    RADIOGRAPHIC STUDIES: I have personally reviewed the radiological images as listed and agreed with the findings in the report. US Breast Ltd Uni Left Inc Axilla  Result Date: 04/28/2019 CLINICAL DATA:  History of left breast lymphoma in 2003. Patient complains of palpable abnormality in the left breast. EXAM: DIGITAL DIAGNOSTIC BILATERAL MAMMOGRAM WITH CAD AND TOMO ULTRASOUND LEFT BREAST COMPARISON:  Previous exam(s). ACR Breast Density Category d: The breast tissue is extremely dense, which lowers the sensitivity of mammography. FINDINGS: There is a 3.2 cm mass in the upper slightly inner quadrant of the left breast. There are no malignant type microcalcifications in the left breast. No suspicious mass or malignant type microcalcifications identified in the right  breast. Mammographic images were processed with CAD. On physical exam, I palpate a discrete mass in the left breast at Targeted ultrasound is performed, showing an irregular hyperechoic mass with central areas of hypoechogenicity measuring 2.9 x 2.3 x 3.1 cm in the left breast at 12 o'clock 6 cm from the nipple. Sonographic evaluation the left axilla does not show any enlarged adenopathy. IMPRESSION: Suspicious mass in the 12 o'clock region of the left breast. RECOMMENDATION: Ultrasound-guided core biopsy of the left breast mass is recommended. I have discussed the findings and recommendations with the patient. Results were also provided in writing at the conclusion of the visit. If applicable, a reminder letter will be sent to the patient regarding the next appointment. BI-RADS CATEGORY  4: Suspicious. Electronically Signed   By: Lillia Mountain M.D.   On: 04/28/2019 11:29   Mm Diag Breast Tomo Bilateral  Result Date: 04/28/2019 CLINICAL DATA:  History of left breast lymphoma in 2003. Patient complains of palpable abnormality in the left breast. EXAM: DIGITAL DIAGNOSTIC BILATERAL MAMMOGRAM WITH CAD AND TOMO ULTRASOUND LEFT BREAST COMPARISON:  Previous exam(s). ACR Breast Density Category d: The breast tissue is extremely dense, which lowers the sensitivity of mammography. FINDINGS: There is a 3.2 cm mass in the upper slightly inner quadrant of the left breast. There are no malignant type microcalcifications in the left breast. No suspicious mass or malignant type microcalcifications identified in the right breast. Mammographic images were processed with CAD. On physical exam, I palpate a discrete mass in the left breast at Targeted ultrasound is performed, showing an irregular hyperechoic mass with central areas of hypoechogenicity measuring 2.9 x 2.3 x 3.1 cm in the left breast at 12 o'clock 6 cm from the nipple. Sonographic evaluation the left axilla does not show any enlarged adenopathy. IMPRESSION: Suspicious  mass in the 12 o'clock region of the left breast. RECOMMENDATION: Ultrasound-guided core biopsy of the left breast mass is recommended. I have discussed the findings and recommendations with the patient. Results were also provided in writing at the conclusion of the visit. If applicable,  a reminder letter will be sent to the patient regarding the next appointment. BI-RADS CATEGORY  4: Suspicious. Electronically Signed   By: Lillia Mountain M.D.   On: 04/28/2019 11:29   Mm Clip Placement Left  Result Date: 05/12/2019 CLINICAL DATA:  Post biopsy mammogram of the left breast for clip placement. EXAM: DIAGNOSTIC LEFT MAMMOGRAM POST ULTRASOUND BIOPSY COMPARISON:  Previous exam(s). FINDINGS: Mammographic images were obtained following ultrasound guided biopsy of a mass in the superior left breast. The Venus shaped biopsy marking clip is well positioned within the mass in the upper inner left breast. IMPRESSION: Appropriate positioning of the venous shaped biopsy marking clip in the upper inner left breast. Final Assessment: Post Procedure Mammograms for Marker Placement Electronically Signed   By: Ammie Ferrier M.D.   On: 05/12/2019 14:05   [place] Lt Breast Bx W Loc Dev 1st Lesion Img Bx Spec US Guide  Addendum Date: 05/19/2019   ADDENDUM REPORT: 05/19/2019 11:21 ADDENDUM: PATHOLOGY revealed: A. BREAST, LEFT AT 12:00, 6 CM FROM THE NIPPLE; ULTRASOUND GUIDED CORE BIOPSY: -DIFFUSE LARGE B CELL LYMPHOMA, FURTHER CLASSIFICATION PENDING IMMUNOHISTOCHEMICAL WORKUP. Comment: Sections display fibroadipose tissue with a dense cellular infiltrate comprised of large cells with high N:C ratio and significant nuclear atypia. Mitotic figures are frequent, and numerous apoptotic bodies are present. Residual mammary parenchyma is not identified. Immunohistochemical studies with appropriately reactive controls were performed, and show these abnormal cells to be positive for CD45 (LCA) and CD20, compatible with B cells. A  superpancytokeratin is negative. 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 Additional immunohistochemical and FISH studies to further classify this process are pending, and will be reported as an addendum. There is sufficient tissue for ancillary testing in both tissue blocks. Pathology results are CONCORDANT with imaging findings, per Dr. Ammie Ferrier. Pathology results were discussed with patient, and patient's daughter Becky Sax), via telephone. The patient reported doing well after the biopsy with tenderness at the site. Post biopsy care instructions were reviewed and questions were answered. The patient was encouraged to call [facility] for any additional concerns. Patient's final pathology report was faxed to [facility] RN at Dr. Otilio Connors office. Recommendation: Referral for medical oncologist will be arranged by Wendy Poet RN, with Dr. Otilio Connors office. Patient was instructed to continue with monthly self breast examinations, clinical follow-up as needed, and to have annual bilateral screening mammogram in one year. Addendum by Electa Sniff RN on 05/19/2019. Electronically Signed   By: Ammie Ferrier M.D.   On: 05/19/2019 11:21   Result Date: 05/19/2019 CLINICAL DATA:  85 year old female presenting for ultrasound-guided biopsy of a left breast mass. EXAM: ULTRASOUND GUIDED LEFT BREAST CORE NEEDLE BIOPSY COMPARISON:  Previous exam(s). FINDINGS: I met with the patient and we discussed the procedure of ultrasound-guided biopsy, including benefits and alternatives. We discussed the high likelihood of a successful procedure. We discussed the risks of the procedure, including infection, bleeding, tissue injury, clip migration, and inadequate sampling. Informed written consent was given. The usual time-out protocol was performed immediately prior to the procedure. Lesion quadrant: Upper inner quadrant  Using sterile technique and 1% Lidocaine as local anesthetic, under direct ultrasound visualization, a 14 gauge spring-loaded device was used to perform biopsy of a mass in the left breast at 11-12 o'clock using an inferior approach. At the conclusion of the procedure a venous shaped tissue marker clip was deployed into the biopsy cavity. Follow up 2  view mammogram was performed and dictated separately. IMPRESSION: Ultrasound guided biopsy of a left breast mass at 12 o'clock. No apparent complications. Electronically Signed: By: Ammie Ferrier M.D. On: 05/12/2019 13:36    ASSESSMENT & PLAN:   Lymphoma of breast ([place]) #Diffuse large B-cell lymphoma left breast [history of similar diagnosis 2004; [facility]].  I think this is new primary.   #Non-GCB subtype; double expresser; FISH studies pending.  Discussed that she has an aggressive lymphoma that needs fairly urgent treatment.  Recommend further work-up including a PET scan.  Bone marrow biopsy if needed.  # Discussed in general goal for therapy in lymphoma is cure.  However if patient/family felt treatments would be appropriate to high risk of side effects; palliative options were also discussed.   # Discussed R-CHOP chemotherapy every 3 weeks; patient will need growth factor support.  Number of cycles of R-CHOP-3 versus 6; additional radiation will depend upon the stage of lymphoma.  Discussed the potential side effects including but not limited to-increasing fatigue, nausea vomiting, diarrhea, hair loss, sores in the mouth, increase risk of infection and also neuropathy. Also discussed regarding potential side effects of heart failure/leukemia with Adriamycin.  2D echo ordered.  #Patient likely received Adriamycin as part of R-CHOP 16 years ago.  Await 2D echo/will have to switch to etoposide based chemotherapy given her age/prior history of Adriamycin.   #Question Alzheimer's-mild dementia.  However patient seems to be her functional at this time.   Will discuss with the patient's primary care Dr. Baldemar Lenis.  Thank you Dr.Babaoff for allowing me to participate in the care of your pleasant patient. Please do not hesitate to contact me with questions or concerns in the interim.  # 60 minutes face-to-face with the patient discussing the above plan of care; more than 50% of time spent on prognosis/ natural history; counseling and coordination.  Also spoke to patient's daughter/son [over the phone; live about 1 to 2 hours away].  They agree with the treatment plan.  # DISPOSITION: # labs today # port referral to IR # PET ASAP # 2 D echo asap # Chemo education- R-CHOP in 1 week # follow up MD-in 1 week.   Cc; Dr.Babaoff  All questions were answered. The patient knows to call the clinic with any problems, questions or concerns.    Cammie Sickle, MD 05/26/2019 1:06 PM

## 2019-05-27 ENCOUNTER — Telehealth: Payer: Self-pay | Admitting: *Deleted

## 2019-05-27 ENCOUNTER — Other Ambulatory Visit: Payer: Self-pay | Admitting: Internal Medicine

## 2019-05-27 ENCOUNTER — Other Ambulatory Visit: Payer: Self-pay | Admitting: *Deleted

## 2019-05-27 ENCOUNTER — Telehealth: Payer: Self-pay | Admitting: Internal Medicine

## 2019-05-27 DIAGNOSIS — C8599 Non-Hodgkin lymphoma, unspecified, extranodal and solid organ sites: Secondary | ICD-10-CM

## 2019-05-27 LAB — HEPATITIS B CORE ANTIBODY, IGM: Hep B C IgM: NEGATIVE

## 2019-05-27 LAB — HEPATITIS B SURFACE ANTIGEN: Hepatitis B Surface Ag: NEGATIVE

## 2019-05-27 LAB — BETA 2 MICROGLOBULIN, SERUM: Beta-2 Microglobulin: 2.1 mg/L (ref 0.6–2.4)

## 2019-05-27 NOTE — Telephone Encounter (Signed)
Spoke with patient's daughter, [name], and her son. Patient's son will transport patient to the echo apt tomorrow. covid testing will also be performed tomorrow. apts provided.

## 2019-05-27 NOTE — Telephone Encounter (Signed)
Spoke to pt's PCP, Dr.Babaoff- is concerned about her underlying mild dementia being exacerbated/worsened by chemotherapy.  Patient has aggressive lymphoma-diffuse B cell double expresser-non-GCB subtype. Awaiting FISH studies.   Patient awaiting PET scan next week.  Awaiting 2D echo/port placement.

## 2019-05-27 NOTE — Progress Notes (Signed)
MDT

## 2019-05-27 NOTE — Telephone Encounter (Signed)
Stephanie Davidson from [facility] returned phone call. She states in order to receive these records, we need to fax a request for medical records on letter head to their office. Fax number [phone]. I have faxed this request to the number provided.

## 2019-05-28 ENCOUNTER — Other Ambulatory Visit: Payer: PRIVATE HEALTH INSURANCE

## 2019-05-28 ENCOUNTER — Other Ambulatory Visit: Payer: Self-pay | Admitting: *Deleted

## 2019-05-28 ENCOUNTER — Ambulatory Visit
Admission: RE | Admit: 2019-05-28 | Discharge: 2019-05-28 | Disposition: A | Discharge status: Home / Self Care | Payer: Medicare Other | Source: Ambulatory Visit | Attending: Internal Medicine | Admitting: Internal Medicine

## 2019-05-28 ENCOUNTER — Other Ambulatory Visit: Payer: Self-pay

## 2019-05-28 DIAGNOSIS — I10 Essential (primary) hypertension: Secondary | ICD-10-CM | POA: Insufficient documentation

## 2019-05-28 DIAGNOSIS — Z9221 Personal history of antineoplastic chemotherapy: Secondary | ICD-10-CM | POA: Diagnosis not present

## 2019-05-28 DIAGNOSIS — Z5111 Encounter for antineoplastic chemotherapy: Secondary | ICD-10-CM

## 2019-05-28 DIAGNOSIS — Z01818 Encounter for other preprocedural examination: Secondary | ICD-10-CM | POA: Diagnosis not present

## 2019-05-28 DIAGNOSIS — I083 Combined rheumatic disorders of mitral, aortic and tricuspid valves: Secondary | ICD-10-CM | POA: Insufficient documentation

## 2019-05-28 DIAGNOSIS — Z923 Personal history of irradiation: Secondary | ICD-10-CM | POA: Insufficient documentation

## 2019-05-28 DIAGNOSIS — C8599 Non-Hodgkin lymphoma, unspecified, extranodal and solid organ sites: Secondary | ICD-10-CM | POA: Insufficient documentation

## 2019-05-28 DIAGNOSIS — Z20822 Contact with and (suspected) exposure to covid-19: Secondary | ICD-10-CM

## 2019-05-28 NOTE — Progress Notes (Signed)
*  PRELIMINARY RESULTS* Echocardiogram 2D Echocardiogram has been performed.  [name] 05/28/2019, 10:35 AM

## 2019-05-28 NOTE — Patient Instructions (Signed)
Rituximab injection What is this medicine? RITUXIMAB (ri TUX i mab) is a monoclonal antibody. It is used to treat certain types of cancer like non-Hodgkin lymphoma and chronic lymphocytic leukemia. It is also used to treat rheumatoid arthritis, granulomatosis with polyangiitis (or Wegener's granulomatosis), microscopic polyangiitis, and pemphigus vulgaris. This medicine may be used for other purposes; ask your health care provider or pharmacist if you have questions. COMMON BRAND NAME(S): Rituxan, RUXIENCE What should I tell my health care provider before I take this medicine? They need to know if you have any of these conditions:  heart disease  infection (especially a virus infection such as hepatitis B, chickenpox, cold sores, or herpes)  immune system problems  irregular heartbeat  kidney disease  low blood counts, like low white cell, platelet, or red cell counts  lung or breathing disease, like asthma  recently received or scheduled to receive a vaccine  an unusual or allergic reaction to rituximab, other medicines, foods, dyes, or preservatives  pregnant or trying to get pregnant  breast-feeding How should I use this medicine? This medicine is for infusion into a vein. It is administered in a hospital or clinic by a specially trained health care professional. A special MedGuide will be given to you by the pharmacist with each prescription and refill. Be sure to read this information carefully each time. Talk to your pediatrician regarding the use of this medicine in children. This medicine is not approved for use in children. Overdosage: If you think you have taken too much of this medicine contact a poison control center or emergency room at once. NOTE: This medicine is only for you. Do not share this medicine with others. What if I miss a dose? It is important not to miss a dose. Call your doctor or health care professional if you are unable to keep an appointment. What  may interact with this medicine?  cisplatin  live virus vaccines This list may not describe all possible interactions. Give your health care provider a list of all the medicines, herbs, non-prescription drugs, or dietary supplements you use. Also tell them if you smoke, drink alcohol, or use illegal drugs. Some items may interact with your medicine. What should I watch for while using this medicine? Your condition will be monitored carefully while you are receiving this medicine. You may need blood work done while you are taking this medicine. This medicine can cause serious allergic reactions. To reduce your risk you may need to take medicine before treatment with this medicine. Take your medicine as directed. In some patients, this medicine may cause a serious brain infection that may cause death. If you have any problems seeing, thinking, speaking, walking, or standing, tell your healthcare professional right away. If you cannot reach your healthcare professional, urgently seek other source of medical care. Call your doctor or health care professional for advice if you get a fever, chills or sore throat, or other symptoms of a cold or flu. Do not treat yourself. This drug decreases your body's ability to fight infections. Try to avoid being around people who are sick. Do not become pregnant while taking this medicine or for at least 12 months after stopping it. Women should inform their doctor if they wish to become pregnant or think they might be pregnant. There is a potential for serious side effects to an unborn child. Talk to your health care professional or pharmacist for more information. Do not breast-feed an infant while taking this medicine or for at   least 6 months after stopping it. What side effects may I notice from receiving this medicine? Side effects that you should report to your doctor or health care professional as soon as possible:  allergic reactions like skin rash, itching or  hives; swelling of the face, lips, or tongue  breathing problems  chest pain  changes in vision  diarrhea  headache with fever, neck stiffness, sensitivity to light, nausea, or confusion  fast, irregular heartbeat  loss of memory  low blood counts - this medicine may decrease the number of white blood cells, red blood cells and platelets. You may be at increased risk for infections and bleeding.  mouth sores  problems with balance, talking, or walking  redness, blistering, peeling or loosening of the skin, including inside the mouth  signs of infection - fever or chills, cough, sore throat, pain or difficulty passing urine  signs and symptoms of kidney injury like trouble passing urine or change in the amount of urine  signs and symptoms of liver injury like dark yellow or brown urine; general ill feeling or flu-like symptoms; light-colored stools; loss of appetite; nausea; right upper belly pain; unusually weak or tired; yellowing of the eyes or skin  signs and symptoms of low blood pressure like dizziness; feeling faint or lightheaded, falls; unusually weak or tired  stomach pain  swelling of the ankles, feet, hands  unusual bleeding or bruising  vomiting Side effects that usually do not require medical attention (report to your doctor or health care professional if they continue or are bothersome):  headache  joint pain  muscle cramps or muscle pain  nausea  tiredness This list may not describe all possible side effects. Call your doctor for medical advice about side effects. You may report side effects to FDA at 1-800-FDA-1088. Where should I keep my medicine? This drug is given in a hospital or clinic and will not be stored at home. NOTE: This sheet is a summary. It may not cover all possible information. If you have questions about this medicine, talk to your doctor, pharmacist, or health care provider.  2020 Elsevier/Gold Standard (2018-10-21  22:01:36) Doxorubicin injection What is this medicine? DOXORUBICIN (dox oh ROO bi sin) is a chemotherapy drug. It is used to treat many kinds of cancer like leukemia, lymphoma, neuroblastoma, sarcoma, and Wilms' tumor. It is also used to treat bladder cancer, breast cancer, lung cancer, ovarian cancer, stomach cancer, and thyroid cancer. This medicine may be used for other purposes; ask your health care provider or pharmacist if you have questions. COMMON BRAND NAME(S): Adriamycin, Adriamycin PFS, Adriamycin RDF, Rubex What should I tell my health care provider before I take this medicine? They need to know if you have any of these conditions:  heart disease  history of low blood counts caused by a medicine  liver disease  recent or ongoing radiation therapy  an unusual or allergic reaction to doxorubicin, other chemotherapy agents, other medicines, foods, dyes, or preservatives  pregnant or trying to get pregnant  breast-feeding How should I use this medicine? This drug is given as an infusion into a vein. It is administered in a hospital or clinic by a specially trained health care professional. If you have pain, swelling, burning or any unusual feeling around the site of your injection, tell your health care professional right away. Talk to your pediatrician regarding the use of this medicine in children. Special care may be needed. Overdosage: If you think you have taken too much of  this medicine contact a poison control center or emergency room at once. NOTE: This medicine is only for you. Do not share this medicine with others. What if I miss a dose? It is important not to miss your dose. Call your doctor or health care professional if you are unable to keep an appointment. What may interact with this medicine? This medicine may interact with the following medications:  6-mercaptopurine  paclitaxel  phenytoin  St. John's Wort  trastuzumab  verapamil This list may not  describe all possible interactions. Give your health care provider a list of all the medicines, herbs, non-prescription drugs, or dietary supplements you use. Also tell them if you smoke, drink alcohol, or use illegal drugs. Some items may interact with your medicine. What should I watch for while using this medicine? This drug may make you feel generally unwell. This is not uncommon, as chemotherapy can affect healthy cells as well as cancer cells. Report any side effects. Continue your course of treatment even though you feel ill unless your doctor tells you to stop. There is a maximum amount of this medicine you should receive throughout your life. The amount depends on the medical condition being treated and your overall health. Your doctor will watch how much of this medicine you receive in your lifetime. Tell your doctor if you have taken this medicine before. You may need blood work done while you are taking this medicine. Your urine may turn red for a few days after your dose. This is not blood. If your urine is dark or brown, call your doctor. In some cases, you may be given additional medicines to help with side effects. Follow all directions for their use. Call your doctor or health care professional for advice if you get a fever, chills or sore throat, or other symptoms of a cold or flu. Do not treat yourself. This drug decreases your body's ability to fight infections. Try to avoid being around people who are sick. This medicine may increase your risk to bruise or bleed. Call your doctor or health care professional if you notice any unusual bleeding. Talk to your doctor about your risk of cancer. You may be more at risk for certain types of cancers if you take this medicine. Do not become pregnant while taking this medicine or for 6 months after stopping it. Women should inform their doctor if they wish to become pregnant or think they might be pregnant. Men should not father a child while  taking this medicine and for 6 months after stopping it. There is a potential for serious side effects to an unborn child. Talk to your health care professional or pharmacist for more information. Do not breast-feed an infant while taking this medicine. This medicine has caused ovarian failure in some women and reduced sperm counts in some men This medicine may interfere with the ability to have a child. Talk with your doctor or health care professional if you are concerned about your fertility. This medicine may cause a decrease in Co-Enzyme Q-10. You should make sure that you get enough Co-Enzyme Q-10 while you are taking this medicine. Discuss the foods you eat and the vitamins you take with your health care professional. What side effects may I notice from receiving this medicine? Side effects that you should report to your doctor or health care professional as soon as possible:  allergic reactions like skin rash, itching or hives, swelling of the face, lips, or tongue  breathing problems  chest  pain  fast or irregular heartbeat  low blood counts - this medicine may decrease the number of white blood cells, red blood cells and platelets. You may be at increased risk for infections and bleeding.  pain, redness, or irritation at site where injected  signs of infection - fever or chills, cough, sore throat, pain or difficulty passing urine  signs of decreased platelets or bleeding - bruising, pinpoint red spots on the skin, black, tarry stools, blood in the urine  swelling of the ankles, feet, hands  tiredness  weakness Side effects that usually do not require medical attention (report to your doctor or health care professional if they continue or are bothersome):  diarrhea  hair loss  mouth sores  nail discoloration or damage  nausea  red colored urine  vomiting This list may not describe all possible side effects. Call your doctor for medical advice about side effects.  You may report side effects to FDA at 1-800-FDA-1088. Where should I keep my medicine? This drug is given in a hospital or clinic and will not be stored at home. NOTE: This sheet is a summary. It may not cover all possible information. If you have questions about this medicine, talk to your doctor, pharmacist, or health care provider.  2020 Elsevier/Gold Standard (2017-04-23 11:01:26) Vincristine injection What is this medicine? VINCRISTINE (vin KRIS teen) is a chemotherapy drug. It slows the growth of cancer cells. This medicine is used to treat many types of cancer like Hodgkin's disease, leukemia, non-Hodgkin's lymphoma, neuroblastoma (brain cancer), rhabdomyosarcoma, and Wilms' tumor. This medicine may be used for other purposes; ask your health care provider or pharmacist if you have questions. COMMON BRAND NAME(S): Oncovin, Vincasar PFS What should I tell my health care provider before I take this medicine? They need to know if you have any of these conditions:  blood disorders  gout  infection (especially chickenpox, cold sores, or herpes)  kidney disease  liver disease  lung disease  nervous system disease like Charcot-Marie-Tooth (CMT)  recent or ongoing radiation therapy  an unusual or allergic reaction to vincristine, other chemotherapy agents, other medicines, foods, dyes, or preservatives  pregnant or trying to get pregnant  breast-feeding How should I use this medicine? This drug is given as an infusion into a vein. It is administered in a hospital or clinic by a specially trained health care professional. If you have pain, swelling, burning, or any unusual feeling around the site of your injection, tell your health care professional right away. Talk to your pediatrician regarding the use of this medicine in children. While this drug may be prescribed for selected conditions, precautions do apply. Overdosage: If you think you have taken too much of this medicine  contact a poison control center or emergency room at once. NOTE: This medicine is only for you. Do not share this medicine with others. What if I miss a dose? It is important not to miss your dose. Call your doctor or health care professional if you are unable to keep an appointment. What may interact with this medicine? Do not take this medicine with any of the following medications:  itraconazole  mibefradil  voriconazole This medicine may also interact with the following medications:  cyclosporine  erythromycin  fluconazole  ketoconazole  medicines for HIV like delavirdine, efavirenz, nevirapine  medicines for seizures like ethotoin, fosphenotoin, phenytoin  medicines to increase blood counts like filgrastim, pegfilgrastim, sargramostim  other chemotherapy drugs like cisplatin, L-asparaginase, methotrexate, mitomycin, paclitaxel  pegaspargase  vaccines  zalcitabine, ddC Talk to your doctor or health care professional before taking any of these medicines:  acetaminophen  aspirin  ibuprofen  ketoprofen  naproxen This list may not describe all possible interactions. Give your health care provider a list of all the medicines, herbs, non-prescription drugs, or dietary supplements you use. Also tell them if you smoke, drink alcohol, or use illegal drugs. Some items may interact with your medicine. What should I watch for while using this medicine? Your condition will be monitored carefully while you are receiving this medicine. You will need important blood work done while you are taking this medicine. This drug may make you feel generally unwell. This is not uncommon, as chemotherapy can affect healthy cells as well as cancer cells. Report any side effects. Continue your course of treatment even though you feel ill unless your doctor tells you to stop. In some cases, you may be given additional medicines to help with side effects. Follow all directions for their  use. Call your doctor or health care professional for advice if you get a fever, chills or sore throat, or other symptoms of a cold or flu. Do not treat yourself. Avoid taking products that contain aspirin, acetaminophen, ibuprofen, naproxen, or ketoprofen unless instructed by your doctor. These medicines may hide a fever. Do not become pregnant while taking this medicine. Women should inform their doctor if they wish to become pregnant or think they might be pregnant. There is a potential for serious side effects to an unborn child. Talk to your health care professional or pharmacist for more information. Do not breast-feed an infant while taking this medicine. Men may have a lower sperm count while taking this medicine. Talk to your doctor if you plan to father a child. What side effects may I notice from receiving this medicine? Side effects that you should report to your doctor or health care professional as soon as possible:  allergic reactions like skin rash, itching or hives, swelling of the face, lips, or tongue  breathing problems  confusion or changes in emotions or moods  constipation  cough  mouth sores  muscle weakness  nausea and vomiting  pain, swelling, redness or irritation at the injection site  pain, tingling, numbness in the hands or feet  problems with balance, talking, walking  seizures  stomach pain  trouble passing urine or change in the amount of urine Side effects that usually do not require medical attention (report to your doctor or health care professional if they continue or are bothersome):  diarrhea  hair loss  jaw pain  loss of appetite This list may not describe all possible side effects. Call your doctor for medical advice about side effects. You may report side effects to FDA at 1-800-FDA-1088. Where should I keep my medicine? This drug is given in a hospital or clinic and will not be stored at home. NOTE: This sheet is a summary. It  may not cover all possible information. If you have questions about this medicine, talk to your doctor, pharmacist, or health care provider.  2020 Elsevier/Gold Standard (2008-06-06 17:17:13) Cyclophosphamide injection What is this medicine? CYCLOPHOSPHAMIDE (sye kloe FOSS fa mide) is a chemotherapy drug. It slows the growth of cancer cells. This medicine is used to treat many types of cancer like lymphoma, myeloma, leukemia, breast cancer, and ovarian cancer, to name a few. This medicine may be used for other purposes; ask your health care provider or pharmacist if you have questions. COMMON BRAND  NAME(S): Cytoxan, Neosar What should I tell my health care provider before I take this medicine? They need to know if you have any of these conditions:  blood disorders  history of other chemotherapy  infection  kidney disease  liver disease  recent or ongoing radiation therapy  tumors in the bone marrow  an unusual or allergic reaction to cyclophosphamide, other chemotherapy, other medicines, foods, dyes, or preservatives  pregnant or trying to get pregnant  breast-feeding How should I use this medicine? This drug is usually given as an injection into a vein or muscle or by infusion into a vein. It is administered in a hospital or clinic by a specially trained health care professional. Talk to your pediatrician regarding the use of this medicine in children. Special care may be needed. Overdosage: If you think you have taken too much of this medicine contact a poison control center or emergency room at once. NOTE: This medicine is only for you. Do not share this medicine with others. What if I miss a dose? It is important not to miss your dose. Call your doctor or health care professional if you are unable to keep an appointment. What may interact with this medicine? This medicine may interact with the following medications:  amiodarone  amphotericin B  azathioprine  certain  antiviral medicines for HIV or AIDS such as protease inhibitors (e.g., indinavir, ritonavir) and zidovudine  certain blood pressure medications such as benazepril, captopril, enalapril, fosinopril, lisinopril, moexipril, monopril, perindopril, quinapril, ramipril, trandolapril  certain cancer medications such as anthracyclines (e.g., daunorubicin, doxorubicin), busulfan, cytarabine, paclitaxel, pentostatin, tamoxifen, trastuzumab  certain diuretics such as chlorothiazide, chlorthalidone, hydrochlorothiazide, indapamide, metolazone  certain medicines that treat or prevent blood clots like warfarin  certain muscle relaxants such as succinylcholine  cyclosporine  etanercept  indomethacin  medicines to increase blood counts like filgrastim, pegfilgrastim, sargramostim  medicines used as general anesthesia  metronidazole  natalizumab This list may not describe all possible interactions. Give your health care provider a list of all the medicines, herbs, non-prescription drugs, or dietary supplements you use. Also tell them if you smoke, drink alcohol, or use illegal drugs. Some items may interact with your medicine. What should I watch for while using this medicine? Visit your doctor for checks on your progress. This drug may make you feel generally unwell. This is not uncommon, as chemotherapy can affect healthy cells as well as cancer cells. Report any side effects. Continue your course of treatment even though you feel ill unless your doctor tells you to stop. Drink water or other fluids as directed. Urinate often, even at night. In some cases, you may be given additional medicines to help with side effects. Follow all directions for their use. Call your doctor or health care professional for advice if you get a fever, chills or sore throat, or other symptoms of a cold or flu. Do not treat yourself. This drug decreases your body's ability to fight infections. Try to avoid being around  people who are sick. This medicine may increase your risk to bruise or bleed. Call your doctor or health care professional if you notice any unusual bleeding. Be careful brushing and flossing your teeth or using a toothpick because you may get an infection or bleed more easily. If you have any dental work done, tell your dentist you are receiving this medicine. You may get drowsy or dizzy. Do not drive, use machinery, or do anything that needs mental alertness until you know how this medicine  affects you. Do not become pregnant while taking this medicine or for 1 year after stopping it. Women should inform their doctor if they wish to become pregnant or think they might be pregnant. Men should not father a child while taking this medicine and for 4 months after stopping it. There is a potential for serious side effects to an unborn child. Talk to your health care professional or pharmacist for more information. Do not breast-feed an infant while taking this medicine. This medicine may interfere with the ability to have a child. This medicine has caused ovarian failure in some women. This medicine has caused reduced sperm counts in some men. You should talk with your doctor or health care professional if you are concerned about your fertility. If you are going to have surgery, tell your doctor or health care professional that you have taken this medicine. What side effects may I notice from receiving this medicine? Side effects that you should report to your doctor or health care professional as soon as possible:  allergic reactions like skin rash, itching or hives, swelling of the face, lips, or tongue  low blood counts - this medicine may decrease the number of white blood cells, red blood cells and platelets. You may be at increased risk for infections and bleeding.  signs of infection - fever or chills, cough, sore throat, pain or difficulty passing urine  signs of decreased platelets or bleeding  - bruising, pinpoint red spots on the skin, black, tarry stools, blood in the urine  signs of decreased red blood cells - unusually weak or tired, fainting spells, lightheadedness  breathing problems  dark urine  dizziness  palpitations  swelling of the ankles, feet, hands  trouble passing urine or change in the amount of urine  weight gain  yellowing of the eyes or skin Side effects that usually do not require medical attention (report to your doctor or health care professional if they continue or are bothersome):  changes in nail or skin color  hair loss  missed menstrual periods  mouth sores  nausea, vomiting This list may not describe all possible side effects. Call your doctor for medical advice about side effects. You may report side effects to FDA at 1-800-FDA-1088. Where should I keep my medicine? This drug is given in a hospital or clinic and will not be stored at home. NOTE: This sheet is a summary. It may not cover all possible information. If you have questions about this medicine, talk to your doctor, pharmacist, or health care provider.  2020 Elsevier/Gold Standard (2012-07-24 16:22:58)

## 2019-05-30 LAB — NOVEL CORONAVIRUS, NAA: SARS-CoV-2, NAA: NOT DETECTED

## 2019-06-01 ENCOUNTER — Encounter: Payer: Self-pay | Admitting: Internal Medicine

## 2019-06-01 ENCOUNTER — Inpatient Hospital Stay (HOSPITAL_BASED_OUTPATIENT_CLINIC_OR_DEPARTMENT_OTHER): Payer: Medicare Other | Admitting: Nurse Practitioner

## 2019-06-01 ENCOUNTER — Other Ambulatory Visit: Payer: Self-pay

## 2019-06-01 ENCOUNTER — Encounter: Payer: Self-pay | Admitting: Nurse Practitioner

## 2019-06-01 ENCOUNTER — Inpatient Hospital Stay: Payer: Medicare Other

## 2019-06-01 ENCOUNTER — Telehealth: Payer: Self-pay | Admitting: Internal Medicine

## 2019-06-01 ENCOUNTER — Other Ambulatory Visit: Payer: Self-pay | Admitting: Physician Assistant

## 2019-06-01 DIAGNOSIS — I10 Essential (primary) hypertension: Secondary | ICD-10-CM | POA: Insufficient documentation

## 2019-06-01 DIAGNOSIS — Z5111 Encounter for antineoplastic chemotherapy: Secondary | ICD-10-CM | POA: Diagnosis not present

## 2019-06-01 DIAGNOSIS — K219 Gastro-esophageal reflux disease without esophagitis: Secondary | ICD-10-CM | POA: Insufficient documentation

## 2019-06-01 DIAGNOSIS — C8599 Non-Hodgkin lymphoma, unspecified, extranodal and solid organ sites: Secondary | ICD-10-CM | POA: Diagnosis not present

## 2019-06-01 DIAGNOSIS — E785 Hyperlipidemia, unspecified: Secondary | ICD-10-CM | POA: Insufficient documentation

## 2019-06-01 DIAGNOSIS — F0390 Unspecified dementia without behavioral disturbance: Secondary | ICD-10-CM | POA: Insufficient documentation

## 2019-06-01 LAB — SURGICAL PATHOLOGY

## 2019-06-01 NOTE — Telephone Encounter (Signed)
On September 4 I had a long discussion the patient's daughter Stephanie Davidson patient's diagnosis of B-cell lymphoma/still awaiting on PET scan for staging.   Patient technically would need more than just R-CHOP chemotherapy/intrathecal [given breast extranodal involvement]; given the double hit nature of the disease-is quite aggressive.  However daughter is extremely concerned about patient's continued independent living during/after chemotherapy.  Patient has expressed wishes about living by herself-as very important quality of life metrics-knowing palliative treatments might just slow down the disease/not cure the disease.  Also discussed with patient's primary care physician Dr. Baldemar Lenis.  We will discuss further at subsequent visits. Follow up as planned.

## 2019-06-01 NOTE — Progress Notes (Signed)
[place]  Telephone:([phone] Fax:([phone]) [phone]  Patient Care Team: Derinda Late, MD as PCP - General (Family Medicine)   Name of the patient: Stephanie Davidson  [id number]  [birth year]   Date of visit: 06/01/19  Diagnosis-diffuse large B-cell lymphoma breast  Chief complaint/Reason for visit- Initial Meeting for [facility], preparing for starting chemotherapy  Heme/Onc history:  Oncology History Overview Note  # AUG 2020- LEFT BREAST DLBCL [non-GCB subtype; Double expressor;NEG- CD-30; Ki-67-80-90%]; FISH pending  # 2006- DLBCL of Breast [s? R-CHOP x 3 cycles- RT; [facility]  # ?mild dementia   Lymphoma of breast ([place])  05/25/2019 Initial Diagnosis   Lymphoma of breast ([place])     Interval history-  Stephanie Davidson, 85 year old female, newly diagnosed with DLBCL of left breast,  who presents to chemo care clinic today for initial meeting in preparation for starting chemotherapy. I introduced the chemo care clinic and we discussed that the role of the clinic is to assist those who are at an increased risk of emergency room visits and/or complications during the course of chemotherapy treatment. We discussed that the increased risk takes into account factors such as age, performance status, and co-morbidities. We also discussed that for some, this might include barriers to care such as not having a primary care provider, lack of insurance/transportation, or not being able to afford medications. We discussed that the goal of the program is to help prevent unplanned ER visits and help reduce complications during chemotherapy. We do this by discussing specific risk factors to each individual and identifying ways that we can help improve these risk factors and reduce barriers to care.   She resides at [facility].  She is accompanied today by her daughter, [name] who also contributes to history.  She also has a son who lives outside of  [place].  She is very functional and says today that her independence is very important to her.  She is most concerned with quality of life as opposed to quantity.  She is followed by social worker Jackey Loge at [facility].  She says she has not yet made up her mind about chemotherapy versus hospice.  She says she generally feels at baseline and is feeling somewhat overwhelmed by recent diagnosis and considering starting chemotherapy.  Her PET scan for staging is scheduled for 06/03/2019.  She is scheduled to have port placed tomorrow.  Transportation is primarily coordinated through [facility] though patient still drives.  ECOG FS:1 - Symptomatic but completely ambulatory  Review of systems- Review of Systems  Constitutional: Positive for weight loss. Negative for chills, fever and malaise/fatigue.  HENT: Negative for congestion, hearing loss and sore throat.   Eyes: Negative for pain and redness.  Respiratory: Negative for cough and shortness of breath.   Cardiovascular: Negative for chest pain and palpitations.  Gastrointestinal: Negative for abdominal pain and nausea.  Genitourinary: Negative for dysuria and frequency.  Musculoskeletal: Negative for falls and myalgias.  Neurological: Negative for dizziness, weakness and headaches.  Endo/Heme/Allergies: Negative for environmental allergies. Does not bruise/bleed easily.  Psychiatric/Behavioral: Positive for memory loss. Negative for depression. The patient is not nervous/anxious.     Current treatment-anticipate starting R-CHOP chemotherapy  No Known Allergies  Past Medical History:  Diagnosis Date  . Breast cancer ([place])   . Hypertension   . Personal history of chemotherapy   . Personal history of radiation therapy     Past Surgical History:  Procedure Laterality Date  . BREAST BIOPSY Right ?   needle bx-benign  . BREAST BIOPSY Left 05/12/2019   [place] bx 12:00, venus marker, path pending  . BREAST EXCISIONAL BIOPSY Left 2003    lymphoma  . BREAST EXCISIONAL BIOPSY Left ?   lump was removed, benign    Social History   Socioeconomic History  . Marital status: Single    Spouse name: Not on file  . Number of children: Not on file  . Years of education: Not on file  . Highest education level: Not on file  Occupational History  . Not on file  Social Needs  . Financial resource strain: Not on file  . Food insecurity    Worry: Not on file    Inability: Not on file  . Transportation needs    Medical: Not on file    Non-medical: Not on file  Tobacco Use  . Smoking status: Never Smoker  . Smokeless tobacco: Never Used  Substance and Sexual Activity  . Alcohol use: No  . Drug use: Not on file  . Sexual activity: Not on file  Lifestyle  . Physical activity    Days per week: Not on file    Minutes per session: Not on file  . Stress: Not on file  Relationships  . Social Herbalist on phone: Not on file    Gets together: Not on file    Attends religious service: Not on file    Active member of club or organization: Not on file    Attends meetings of clubs or organizations: Not on file    Relationship status: Not on file  . Intimate partner violence    Fear of current or ex partner: Not on file    Emotionally abused: Not on file    Physically abused: Not on file    Forced sexual activity: Not on file  Other Topics Concern  . Not on file  Social History Narrative   [place]; own apartment; no smoking; no alcohol. 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     Family History  Problem Relation Age of Onset  . Breast cancer Neg Hx      Current Outpatient Medications:  .  amLODipine (NORVASC) 10 MG tablet, Take 1 tablet by mouth daily., Disp: , Rfl:  .  Calcium Carbonate-Vitamin D (CALTRATE 600+D PO), Take 1 tablet by mouth 2 (two) times daily., Disp: , Rfl:  .  docusate calcium (SURFAK) 240 MG capsule, Take 1 capsule by mouth 2 (two) times daily., Disp: , Rfl:  .  donepezil (ARICEPT) 10 MG tablet, Take 1 tablet  by mouth at bedtime., Disp: , Rfl:  .  fluticasone (FLONASE) 50 MCG/ACT nasal spray, Place 2 sprays into both nostrils daily., Disp: , Rfl:  .  hydrochlorothiazide (HYDRODIURIL) 25 MG tablet, Take 1 tablet by mouth daily., Disp: , Rfl:  .  irbesartan (AVAPRO) 300 MG tablet, Take 1 tablet by mouth daily., Disp: , Rfl:  .  meloxicam (MOBIC) 15 MG tablet, Take 1 tablet by mouth as needed., Disp: , Rfl:  .  Multiple Vitamin (MULTI-VITAMIN) tablet, Take 1 tablet by mouth daily., Disp: , Rfl:  .  Omega-3 Fatty Acids (FISH OIL) 1000 MG CAPS, Take 1 tablet by mouth daily., Disp: , Rfl:  .  omeprazole (PRILOSEC) 20 MG capsule, Take 1 capsule by mouth daily., Disp: , Rfl:  .  traZODone (DESYREL) 50 MG tablet, Take 1 tablet by mouth 2 (two) times daily at  8 am and 10 pm., Disp: , Rfl:   Physical exam: There were no vitals filed for this visit. Physical Exam Constitutional:      General: She is not in acute distress.    Comments: Frail appearing. Thin built. In exam room. Accompanied by daughter.   HENT:     Head: Normocephalic and atraumatic.     Nose: No rhinorrhea.  Eyes:     General: No scleral icterus.    Conjunctiva/sclera: Conjunctivae normal.  Cardiovascular:     Rate and Rhythm: Normal rate and regular rhythm.  Pulmonary:     Effort: Pulmonary effort is normal.     Breath sounds: Normal breath sounds.  Abdominal:     Palpations: Abdomen is soft.     Tenderness: There is no abdominal tenderness.  Musculoskeletal:        General: No deformity or signs of injury.     Comments: No aids  Skin:    General: Skin is warm and dry.  Neurological:     Mental Status: She is alert and oriented to person, place, and time.     Motor: No weakness.  Psychiatric:        Mood and Affect: Mood normal.        Behavior: Behavior normal.      CMP Latest Ref Rng & Units 05/26/2019  Glucose 70 - 99 mg/dL 108(H)  BUN 8 - 23 mg/dL 25(H)  Creatinine 0.44 - 1.00 mg/dL 0.82  Sodium 135 - 145 mmol/L 136   Potassium 3.5 - 5.1 mmol/L 3.9  Chloride 98 - 111 mmol/L 99  CO2 22 - 32 mmol/L 29  Calcium 8.9 - 10.3 mg/dL 10.3  Total Protein 6.5 - 8.1 g/dL 8.5(H)  Total Bilirubin 0.3 - 1.2 mg/dL 0.6  Alkaline Phos 38 - 126 U/L 118  AST 15 - 41 U/L 29  ALT 0 - 44 U/L 21   CBC Latest Ref Rng & Units 05/26/2019  WBC 4.0 - 10.5 K/uL 7.1  Hemoglobin 12.0 - 15.0 g/dL 13.5  Hematocrit 36.0 - 46.0 % 41.0  Platelets 150 - 400 K/uL 170    No images are attached to the encounter.  Mm Clip Placement Left  Result Date: 05/12/2019 CLINICAL DATA:  Post biopsy mammogram of the left breast for clip placement. EXAM: DIAGNOSTIC LEFT MAMMOGRAM POST ULTRASOUND BIOPSY COMPARISON:  Previous exam(s). FINDINGS: Mammographic images were obtained following ultrasound guided biopsy of a mass in the superior left breast. The Venus shaped biopsy marking clip is well positioned within the mass in the upper inner left breast. IMPRESSION: Appropriate positioning of the venous shaped biopsy marking clip in the upper inner left breast. Final Assessment: Post Procedure Mammograms for Marker Placement Electronically Signed   By: Ammie Ferrier M.D.   On: 05/12/2019 14:05   [place] Lt Breast Bx W Loc Dev 1st Lesion Img Bx Spec US Guide  Addendum Date: 05/19/2019   ADDENDUM REPORT: 05/19/2019 11:21 ADDENDUM: PATHOLOGY revealed: A. BREAST, LEFT AT 12:00, 6 CM FROM THE NIPPLE; ULTRASOUND GUIDED CORE BIOPSY: -DIFFUSE LARGE B CELL LYMPHOMA, FURTHER CLASSIFICATION PENDING IMMUNOHISTOCHEMICAL WORKUP. Comment: Sections display fibroadipose tissue with a dense cellular infiltrate comprised of large cells with high N:C ratio and significant nuclear atypia. Mitotic figures are frequent, and numerous apoptotic bodies are present. Residual mammary parenchyma is not identified. Immunohistochemical studies with appropriately reactive controls were performed, and show these abnormal cells to be positive for CD45 (LCA) and CD20, compatible with B cells. A  superpancytokeratin is  negative. 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 Additional immunohistochemical and FISH studies to further classify this process are pending, and will be reported as an addendum. There is sufficient tissue for ancillary testing in both tissue blocks. Pathology results are CONCORDANT with imaging findings, per Dr. Ammie Ferrier. Pathology results were discussed with patient, and patient's daughter Becky Sax), via telephone. The patient reported doing well after the biopsy with tenderness at the site. Post biopsy care instructions were reviewed and questions were answered. The patient was encouraged to call [facility] for any additional concerns. Patient's final pathology report was faxed to [facility] RN at Dr. Otilio Connors office. Recommendation: Referral for medical oncologist will be arranged by Wendy Poet RN, with Dr. Otilio Connors office. Patient was instructed to continue with monthly self breast examinations, clinical follow-up as needed, and to have annual bilateral screening mammogram in one year. Addendum by Electa Sniff RN on 05/19/2019. Electronically Signed   By: Ammie Ferrier M.D.   On: 05/19/2019 11:21   Result Date: 05/19/2019 CLINICAL DATA:  85 year old female presenting for ultrasound-guided biopsy of a left breast mass. EXAM: ULTRASOUND GUIDED LEFT BREAST CORE NEEDLE BIOPSY COMPARISON:  Previous exam(s). FINDINGS: I met with the patient and we discussed the procedure of ultrasound-guided biopsy, including benefits and alternatives. We discussed the high likelihood of a successful procedure. We discussed the risks of the procedure, including infection, bleeding, tissue injury, clip migration, and inadequate sampling. Informed written consent was given. The usual time-out protocol was performed immediately prior to the procedure. Lesion quadrant: Upper inner quadrant  Using sterile technique and 1% Lidocaine as local anesthetic, under direct ultrasound visualization, a 14 gauge spring-loaded device was used to perform biopsy of a mass in the left breast at 11-12 o'clock using an inferior approach. At the conclusion of the procedure a venous shaped tissue marker clip was deployed into the biopsy cavity. Follow up 2 view mammogram was performed and dictated separately. IMPRESSION: Ultrasound guided biopsy of a left breast mass at 12 o'clock. No apparent complications. Electronically Signed: By: Ammie Ferrier M.D. On: 05/12/2019 13:36     Assessment and plan- Patient is a 85 y.o. female who presents to [facility] for initial meeting in preparation for starting chemotherapy for the treatment of diffuse large B-cell lymphoma of left breast.   1. Diffuse large B-cell lymphoma of left breast- hx of DLBCL in 2004 though per Dr. Rogue Bussing, new primary. FISH pending at this time. PET later this week. Port placement tomorrow. Patient unsure if she wishes to pursue chemotherapy and questions life expectancy vs potential side effects of treatment. Particularly concerned of impact on her quality of life and potential exacerbation on her mild dementia. We also discussed that there may be options for receiving 'some treatment' to slow progression or palliate symptoms as opposed to 'full chemo' which she was interested in. Will refer to outpatient palliative care & discussed with Dr. Rogue Bussing as well.   2. Chemo Care Clinic/High Risk for ER/Hospitalization during chemotherapy- We discussed the role of the chemo care clinic and identified patient specific risk factors. I discussed that patient was identified as high risk primarily based on: medicare status.   She currently has a primary care provider whom she sees regularly.  Denies needing refills today.  No recent hospital admissions.  Last ER visit was in 2017 for fall.   3. Social Determinants of Health- we discussed  that social determinants of health may have significant impacts on health and outcomes for cancer patients.  Today we discussed specific social determinants of performance status, alcohol use, depression, financial needs, food insecurity, housing, interpersonal violence, social connections, stress, tobacco use, and transportation.  After lengthy discussion the following were identified as areas of need: none.   She resides at [facility] and is supported with a Education officer, museum who follows her case and a care aide who helps her around her apartment.  She lives independently but has options to increase level of care if needed.  She says she is interested in staying in her home.  She is active and walks daily.  Denies depression or anxiety.  Denies financial, housing, or food insecurity.  Denies social isolation.  Says she is managing her stress well.  Denies alcohol or tobacco abuse.  She currently drives but also has transportation available through [facility].  4. Co-morbidities Complicating Care:  -Dementia-early stage dementia per daughter.  Currently managed with Aricept per PCP.  Patient is alert, oriented, and has apparent good level of understanding of her condition and treatment course today as evidenced by insightful questions.  We discussed that medications for lymphoma may exacerbate mood and/or dementia symptoms.  She says this is key concerned her she hopes to remain independent.  5. Palliative Care- based on stage of cancer and/or identified needs today, I will refer patient to palliative care for goals of care and advanced care planning.   We also discussed the role of the Symptom Management Clinic at [facility] for acute issues and methods of contacting clinic/provider. She denies needing specific assistance at this time.  Advised her to call the cancer center if she has any questions or concerns.  Visit Diagnosis 1. Lymphoma of breast [facility])    Patient expressed understanding and was in agreement  with this plan. She also understands that She can call clinic at any time with any questions, concerns, or complaints.   A total of (15) minutes of face-to-face time was spent with this patient with greater than 50% of that time in counseling and care-coordination.  Beckey Rutter, DNP, AGNP-C [place] [facility] [place] (work cell) [phone] (office)  CC: Billey Chang, NP

## 2019-06-01 NOTE — Progress Notes (Signed)
Patient scheduled for Port insertion 06/02/2019. I spoke with patient's daughter for pre procedure instructions with questions answered. NPO after 0800. Patient to be here at 1330 tomorrow, and thus noted with Melissa/daughter.

## 2019-06-02 ENCOUNTER — Ambulatory Visit
Admission: RE | Admit: 2019-06-02 | Discharge: 2019-06-02 | Disposition: A | Discharge status: Home / Self Care | Payer: Medicare Other | Source: Ambulatory Visit | Attending: Internal Medicine | Admitting: Internal Medicine

## 2019-06-02 DIAGNOSIS — C8599 Non-Hodgkin lymphoma, unspecified, extranodal and solid organ sites: Secondary | ICD-10-CM | POA: Insufficient documentation

## 2019-06-02 DIAGNOSIS — I10 Essential (primary) hypertension: Secondary | ICD-10-CM | POA: Diagnosis not present

## 2019-06-02 DIAGNOSIS — Z79899 Other long term (current) drug therapy: Secondary | ICD-10-CM | POA: Insufficient documentation

## 2019-06-02 HISTORY — PX: IR IMAGING GUIDED PORT INSERTION: IMG5740

## 2019-06-02 LAB — PROTIME-INR
INR: 1 (ref 0.8–1.2)
Prothrombin Time: 13.2 seconds (ref 11.4–15.2)

## 2019-06-02 LAB — CBC
HCT: 39.7 % (ref 36.0–46.0)
Hemoglobin: 12.9 g/dL (ref 12.0–15.0)
MCH: 29.7 pg (ref 26.0–34.0)
MCHC: 32.5 g/dL (ref 30.0–36.0)
MCV: 91.3 fL (ref 80.0–100.0)
Platelets: 195 10*3/uL (ref 150–400)
RBC: 4.35 MIL/uL (ref 3.87–5.11)
RDW: 13.2 % (ref 11.5–15.5)
WBC: 6.4 10*3/uL (ref 4.0–10.5)
nRBC: 0 % (ref 0.0–0.2)

## 2019-06-02 MED ORDER — SODIUM CHLORIDE 0.9 % IV SOLN
INTRAVENOUS | Status: DC
  Administered 2019-06-02: 14:00:00 via INTRAVENOUS

## 2019-06-02 MED ORDER — HEPARIN SOD (PORK) LOCK FLUSH 100 UNIT/ML IV SOLN
INTRAVENOUS | Status: AC
  Filled 2019-06-02: qty 5

## 2019-06-02 MED ORDER — CEFAZOLIN SODIUM-DEXTROSE 2-4 GM/100ML-% IV SOLN: 2.0000 g | Freq: Once | INTRAVENOUS | Status: DC

## 2019-06-02 MED ORDER — MIDAZOLAM HCL 5 MG/5ML IJ SOLN
INTRAMUSCULAR | Status: AC
  Filled 2019-06-02: qty 5

## 2019-06-02 MED ORDER — FENTANYL CITRATE (PF) 100 MCG/2ML IJ SOLN
INTRAMUSCULAR | Status: AC | PRN
  Administered 2019-06-02 (×2): 50 ug via INTRAVENOUS

## 2019-06-02 MED ORDER — FENTANYL CITRATE (PF) 100 MCG/2ML IJ SOLN
INTRAMUSCULAR | Status: AC
  Filled 2019-06-02: qty 2

## 2019-06-02 MED ORDER — MIDAZOLAM HCL 2 MG/2ML IJ SOLN
INTRAMUSCULAR | Status: AC | PRN
  Administered 2019-06-02 (×2): 1 mg via INTRAVENOUS

## 2019-06-02 NOTE — Procedures (Signed)
Interventional Radiology Procedure Note  Procedure: Placement of a right IJ approach single lumen PowerPort.  Tip is positioned at the superior cavoatrial junction and catheter is ready for immediate use.  Complications: None Recommendations:  - Ok to shower tomorrow - Do not submerge for 7 days - Routine line care   Signed,  Jameson Morrow S. Astra Gregg, DO   

## 2019-06-02 NOTE — Progress Notes (Signed)
Patient clinically stable post Port Placement per Dr Earleen Newport, tolerated well,. Vitals stable. Alert and oriented, dressing to right chest dry and intact, taking po's without difficulty,. Denies complaints at this time. Discharge instructions given with questions answered,and noted.

## 2019-06-02 NOTE — Discharge Instructions (Signed)
Tunneled Catheter Insertion, Care After °This sheet gives you information about how to care for yourself after your procedure. Your health care provider may also give you more specific instructions. If you have problems or questions, contact your health care provider. °What can I expect after the procedure? °After the procedure, it is common to have: °· Some mild redness, bruising, swelling, and pain around your catheter site. °· A small amount of blood or clear fluid coming from your incisions. °Follow these instructions at home: °Incision care ° °· Follow instructions from your health care provider about how to take care of your incisions. Make sure you: °? Wash your hands with soap and water before and after you change your bandages (dressings). If soap and water are not available, use hand sanitizer. °? Change your dressings as told by your health care provider. Wash the area around your incisions with a germ-killing (antiseptic) solution when you change your dressings. °? Leave stitches (sutures), skin glue, or adhesive strips in place. These skin closures may need to stay in place for 2 weeks or longer. If adhesive strip edges start to loosen and curl up, you may trim the loose edges. Do not remove adhesive strips completely unless your health care provider tells you to do that. °· Keep your dressings clean and dry. °· Check your incision areas every day for signs of infection. Check for: °? More redness, swelling, or pain. °? More fluid or blood. °? Warmth. °? Pus or a bad smell. °Catheter care ° °· Wash your hands with soap and water before and after caring for your catheter. If soap and water are not available, use hand sanitizer. °· Keep your catheter site clean and dry. °· Apply an antibiotic ointment to your catheter site as told by your health care provider. °· Flush your catheter as told by your health care provider. This helps prevent it from becoming clogged. °· Do not open the caps on the ends of  the catheter. °· Do not pull on your catheter. °Medicines °· Take over-the-counter and prescription medicines only as told by your health care provider. °· If you were prescribed an antibiotic medicine, take it as told by your health care provider. Do not stop taking the antibiotic even if you start to feel better. °Activity °· Return to your normal activities as told by your health care provider. Ask your health care provider what activities are safe for you. °· Follow any other activity restrictions as instructed by your health care provider. °· Do not lift anything that is heavier than 10 lb (4.5 kg), or the limit that you are told, until your health care provider says that it is safe. °Driving °· Do not drive until your health care provider approves. °· Ask your health care provider if the medicine prescribed to you requires you to avoid driving or using heavy machinery. °General instructions °· Follow your health care provider's specific instructions for the type of catheter that you have. °· Do not take baths, swim, or use a hot tub until your health care provider approves. Ask your health care provider if you may take showers. °· Keep all follow-up visits as told by your health care provider. This is important. °Contact a health care provider if: °· You feel unusually weak or nauseous. °· You have more redness, swelling, or pain at your incisions or around the area where your catheter is inserted. °· Your catheter is not working properly. °· You are unable to flush your catheter. °  Get help right away if: °· Your catheter develops a hole or it breaks. °· You have pain or swelling when fluids or medicines are being given through the catheter. °· Fluid is leaking from the catheter, under the dressing, or around the dressing. °· Your catheter comes loose or gets pulled completely out. If this happens, press on your catheter site firmly with a clean cloth until you can get medical help. °· You have swelling in  your shoulder, neck, chest, or face. °· You have chest pain or difficulty breathing. °· You feel dizzy or light-headed. °· You have pus or a bad smell coming from your catheter site. °· You have a fever or chills. °· Your catheter site feels warm to the touch. °· You develop bleeding from your catheter or your insertion site, and your bleeding does not stop. °Summary °· After the procedure, it is common to have mild redness, swelling, and pain around your catheter site. °· Return to your normal activities as told by your health care provider. Ask your health care provider what activities are safe for you. °· Follow your health care provider's specific instructions for the type of catheter that you have. °· Keep your catheter site and your dressings clean and dry. °· Contact a health care provider if your catheter is not working properly. Get help right away if you have chest pain, fever, or difficulty breathing. °This information is not intended to replace advice given to you by your health care provider. Make sure you discuss any questions you have with your health care provider. °Document Released: 08/26/2012 Document Revised: 09/01/2018 Document Reviewed: 09/01/2018 °Elsevier Patient Education © 2020 [employer] ° °

## 2019-06-02 NOTE — H&P (Signed)
Chief Complaint: lymphoma  Referring Physician(s): Cammie Sickle  Supervising Physician: Corrie Mckusick  Patient Status: [facility] - Out-pt  History of Present Illness: Stephanie Davidson is a 85 y.o. female presenting for port placement.   She denies any new symptoms.   Past Medical History:  Diagnosis Date  . Breast cancer ([place])   . Hypertension   . Personal history of chemotherapy   . Personal history of radiation therapy     Past Surgical History:  Procedure Laterality Date  . BREAST BIOPSY Right ?   needle bx-benign  . BREAST BIOPSY Left 05/12/2019   [place] bx 12:00, venus marker, path pending  . BREAST EXCISIONAL BIOPSY Left 2003   lymphoma  . BREAST EXCISIONAL BIOPSY Left ?   lump was removed, benign    Allergies: Patient has no known allergies.  Medications: Prior to Admission medications   Medication Sig Start Date End Date Taking? Authorizing Provider  amLODipine (NORVASC) 10 MG tablet Take 1 tablet by mouth daily. 02/04/19  Yes [provider]  Calcium Carbonate-Vitamin D (CALTRATE 600+D PO) Take 1 tablet by mouth 2 (two) times daily.   Yes [provider]  docusate calcium (SURFAK) 240 MG capsule Take 1 capsule by mouth 2 (two) times daily.   Yes [provider]  donepezil (ARICEPT) 10 MG tablet Take 1 tablet by mouth at bedtime. 02/04/19 02/04/20 Yes [provider]  fluticasone (FLONASE) 50 MCG/ACT nasal spray Place 2 sprays into both nostrils daily. 10/06/18  Yes [provider]  hydrochlorothiazide (HYDRODIURIL) 25 MG tablet Take 1 tablet by mouth daily. 02/04/19  Yes [provider]  irbesartan (AVAPRO) 300 MG tablet Take 1 tablet by mouth daily. 10/07/18  Yes [provider]  meloxicam (MOBIC) 15 MG tablet Take 1 tablet by mouth as needed. 02/22/19  Yes [provider]  Multiple Vitamin (MULTI-VITAMIN) tablet Take 1 tablet by mouth daily.   Yes [provider]  Omega-3 Fatty  Acids (FISH OIL) 1000 MG CAPS Take 1 tablet by mouth daily.   Yes [provider]  omeprazole (PRILOSEC) 20 MG capsule Take 1 capsule by mouth daily. 04/08/18  Yes [provider]  traZODone (DESYREL) 50 MG tablet Take 1 tablet by mouth 2 (two) times daily at 8 am and 10 pm. 08/06/18 08/06/19 Yes [provider]     Family History  Problem Relation Age of Onset  . Breast cancer Neg Hx     Social History   Socioeconomic History  . Marital status: Single    Spouse name: Not on file  . Number of children: 2  . Years of education: Not on file  . Highest education level: Not on file  Occupational History  . Not on file  Social Needs  . Financial resource strain: Not hard at all  . Food insecurity    Worry: Never true    Inability: Never true  . Transportation needs    Medical: No    Non-medical: No  Tobacco Use  . Smoking status: Never Smoker  . Smokeless tobacco: Never Used  Substance and Sexual Activity  . Alcohol use: No  . Drug use: Not on file  . Sexual activity: Not on file  Lifestyle  . Physical activity    Days per week: 7 days    Minutes per session: 30 min  . Stress: Only a little  Relationships  . Social connections    Talks on phone: More than three times a week  Gets together: More than three times a week    Attends religious service: Not on file    Active member of club or organization: No    Attends meetings of clubs or organizations: Never    Relationship status: Not on file  Other Topics Concern  . Not on file  Social History Narrative   [place]; own apartment; no smoking; no alcohol. 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        Review of Systems: A 12 point ROS discussed and pertinent positives are indicated in the HPI above.  All other systems are negative.  Review of Systems  Vital Signs: BP (!) 105/58 (BP Location: Left Arm)   Pulse 68   Temp 97.8 F (36.6 C) (Oral)   Resp 12   Ht _0  (1.549 m)   Wt 47.6 kg   SpO2 99%    BMI 19.84 kg/m   Physical Exam General: 85 yo female appearing  stated age.  Well-developed, well-nourished.  No distress. HEENT: Atraumatic, normocephalic.  Conjugate gaze, extra-ocular motor intact. No scleral icterus or scleral injection. No lesions on external ears, nose, lips, or gums.  Oral mucosa moist, pink.  Neck: Symmetric with no goiter enlargement.  Chest/Lungs:  Symmetric chest with inspiration/expiration.  No labored breathing.  Clear to auscultation with no wheezes, rhonchi, or rales.  Heart:  RRR, with no third heart sounds appreciated. No JVD appreciated.  Abdomen:  Soft, NT/ND, with + bowel sounds.   Genito-urinary: Deferred Neurologic: Alert & Oriented to person, place, and time.   Normal affect and insight.  Appropriate questions.  Moving all 4 extremities with gross sensory intact.     Imaging: Mm Clip Placement Left  Result Date: 05/12/2019 CLINICAL DATA:  Post biopsy mammogram of the left breast for clip placement. EXAM: DIAGNOSTIC LEFT MAMMOGRAM POST ULTRASOUND BIOPSY COMPARISON:  Previous exam(s). FINDINGS: Mammographic images were obtained following ultrasound guided biopsy of a mass in the superior left breast. The Venus shaped biopsy marking clip is well positioned within the mass in the upper inner left breast. IMPRESSION: Appropriate positioning of the venous shaped biopsy marking clip in the upper inner left breast. Final Assessment: Post Procedure Mammograms for Marker Placement Electronically Signed   By: Ammie Ferrier M.D.   On: 05/12/2019 14:05   [place] Lt Breast Bx W Loc Dev 1st Lesion Img Bx Spec US Guide  Addendum Date: 05/19/2019   ADDENDUM REPORT: 05/19/2019 11:21 ADDENDUM: PATHOLOGY revealed: A. BREAST, LEFT AT 12:00, 6 CM FROM THE NIPPLE; ULTRASOUND GUIDED CORE BIOPSY: -DIFFUSE LARGE B CELL LYMPHOMA, FURTHER CLASSIFICATION PENDING IMMUNOHISTOCHEMICAL WORKUP. Comment: Sections display fibroadipose tissue with a dense cellular infiltrate comprised of large  cells with high N:C ratio and significant nuclear atypia. Mitotic figures are frequent, and numerous apoptotic bodies are present. Residual mammary parenchyma is not identified. Immunohistochemical studies with appropriately reactive controls were performed, and show these abnormal cells to be positive for CD45 (LCA) and CD20, compatible with B cells. A superpancytokeratin is negative. 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 Additional immunohistochemical and FISH studies to further classify this process are pending, and will be reported as an addendum. There is sufficient tissue for ancillary testing in both tissue blocks. Pathology results are CONCORDANT with imaging findings, per Dr. Ammie Ferrier. Pathology results were discussed with patient, and patient's daughter Becky Sax), via telephone. The patient reported doing well after the biopsy with tenderness at the site. Post biopsy care  instructions were reviewed and questions were answered. The patient was encouraged to call [facility] for any additional concerns. Patient's final pathology report was faxed to [facility] RN at Dr. Otilio Connors office. Recommendation: Referral for medical oncologist will be arranged by Wendy Poet RN, with Dr. Otilio Connors office. Patient was instructed to continue with monthly self breast examinations, clinical follow-up as needed, and to have annual bilateral screening mammogram in one year. Addendum by Electa Sniff RN on 05/19/2019. Electronically Signed   By: Ammie Ferrier M.D.   On: 05/19/2019 11:21   Result Date: 05/19/2019 CLINICAL DATA:  85 year old female presenting for ultrasound-guided biopsy of a left breast mass. EXAM: ULTRASOUND GUIDED LEFT BREAST CORE NEEDLE BIOPSY COMPARISON:  Previous exam(s). FINDINGS: I met with the patient and we discussed the procedure of ultrasound-guided biopsy, including benefits  and alternatives. We discussed the high likelihood of a successful procedure. We discussed the risks of the procedure, including infection, bleeding, tissue injury, clip migration, and inadequate sampling. Informed written consent was given. The usual time-out protocol was performed immediately prior to the procedure. Lesion quadrant: Upper inner quadrant Using sterile technique and 1% Lidocaine as local anesthetic, under direct ultrasound visualization, a 14 gauge spring-loaded device was used to perform biopsy of a mass in the left breast at 11-12 o'clock using an inferior approach. At the conclusion of the procedure a venous shaped tissue marker clip was deployed into the biopsy cavity. Follow up 2 view mammogram was performed and dictated separately. IMPRESSION: Ultrasound guided biopsy of a left breast mass at 12 o'clock. No apparent complications. Electronically Signed: By: Ammie Ferrier M.D. On: 05/12/2019 13:36    Labs:  CBC: Recent Labs    05/26/19 1237 06/02/19 1411  WBC 7.1 6.4  HGB 13.5 12.9  HCT 41.0 39.7  PLT 170 195    COAGS: Recent Labs    06/02/19 1411  INR 1.0    BMP: Recent Labs    05/26/19 1237  NA 136  K 3.9  CL 99  CO2 29  GLUCOSE 108*  BUN 25*  CALCIUM 10.3  CREATININE 0.82  GFRNONAA >60  GFRAA >60    LIVER FUNCTION TESTS: Recent Labs    05/26/19 1237  BILITOT 0.6  AST 29  ALT 21  ALKPHOS 118  PROT 8.5*  ALBUMIN 4.9    TUMOR MARKERS: No results for input(s): AFPTM, CEA, CA199, CHROMGRNA in the last 8760 hours.  Assessment and Plan:  85 yo female presents for port catheter.   Risks and benefits of image guided port-a-catheter placement was discussed with the patient including, but not limited to bleeding, infection, pneumothorax, or fibrin sheath development and need for additional procedures.  All of the patient's questions were answered, patient is agreeable to proceed. Consent signed and in chart.   Thank you for this  interesting consult.  I greatly enjoyed meeting Stephanie Davidson and look forward to participating in their care.  A copy of this report was sent to the requesting provider on this date.  Electronically Signed: Corrie Mckusick, DO 06/02/2019, 4:20 PM   I spent a total of  30 Minutes   in face to face in clinical consultation, greater than 50% of which was counseling/coordinating care for port catheter placement.

## 2019-06-03 ENCOUNTER — Other Ambulatory Visit: Payer: Medicare Other

## 2019-06-03 ENCOUNTER — Encounter
Admission: RE | Admit: 2019-06-03 | Discharge: 2019-06-03 | Disposition: A | Discharge status: Home / Self Care | Payer: Medicare Other | Source: Ambulatory Visit | Attending: Internal Medicine | Admitting: Internal Medicine

## 2019-06-03 ENCOUNTER — Other Ambulatory Visit: Payer: Self-pay

## 2019-06-03 ENCOUNTER — Ambulatory Visit: Payer: PRIVATE HEALTH INSURANCE | Admitting: Internal Medicine

## 2019-06-03 ENCOUNTER — Encounter: Payer: Self-pay | Admitting: Interventional Radiology

## 2019-06-03 DIAGNOSIS — I700 Atherosclerosis of aorta: Secondary | ICD-10-CM | POA: Insufficient documentation

## 2019-06-03 DIAGNOSIS — I2510 Atherosclerotic heart disease of native coronary artery without angina pectoris: Secondary | ICD-10-CM | POA: Diagnosis not present

## 2019-06-03 DIAGNOSIS — C8599 Non-Hodgkin lymphoma, unspecified, extranodal and solid organ sites: Secondary | ICD-10-CM | POA: Diagnosis present

## 2019-06-03 LAB — GLUCOSE, CAPILLARY: Glucose-Capillary: 99 mg/dL (ref 70–99)

## 2019-06-03 MED ORDER — FLUDEOXYGLUCOSE F - 18 (FDG) INJECTION
5.7800 | Freq: Once | INTRAVENOUS | Status: AC | PRN
  Administered 2019-06-03: 5.78 via INTRAVENOUS

## 2019-06-03 NOTE — Progress Notes (Signed)
Tumor Board Documentation  Stephanie Davidson was presented by Dr Rogue Bussing at our Tumor Board on 06/03/2019, which included representatives from medical oncology, radiation oncology, pathology, radiology, surgical, surgical oncology, navigation, internal medicine, pharmacy, palliative care, research.  Stephanie Davidson currently presents as a new patient, for new positive pathology, for [place] with history of the following treatments: surgical intervention(s), active survellience.  Additionally, we reviewed previous medical and familial history, history of present illness, and recent lab results along with all available histopathologic and imaging studies. The tumor board considered available treatment options and made the following recommendations: Adjuvant radiation, Chemotherapy    The following procedures/referrals were also placed: No orders of the defined types were placed in this encounter.   Clinical Trial Status: not discussed   Staging used: AJCC Stage Group  AJCC Staging:       Group: DLBCL of Breast Stage 1E   National site-specific guidelines NCCN were discussed with respect to the case.  Tumor board is a meeting of clinicians from various specialty areas who evaluate and discuss patients for whom a multidisciplinary approach is being considered. Final determinations in the plan of care are those of the provider(s). The responsibility for follow up of recommendations given during tumor board is that of the provider.   Today's extended care, comprehensive team conference, Stephanie Davidson was not present for the discussion and was not examined.   Multidisciplinary Tumor Board is a multidisciplinary case peer review process.  Decisions discussed in the Multidisciplinary Tumor Board reflect the opinions of the specialists present at the conference without having examined the patient.  Ultimately, treatment and diagnostic decisions rest with the primary provider(s) and the patient.

## 2019-06-03 NOTE — Progress Notes (Signed)
Left message no answer

## 2019-06-04 ENCOUNTER — Other Ambulatory Visit: Payer: Self-pay

## 2019-06-04 ENCOUNTER — Inpatient Hospital Stay (HOSPITAL_BASED_OUTPATIENT_CLINIC_OR_DEPARTMENT_OTHER): Payer: Medicare Other | Admitting: Hospice and Palliative Medicine

## 2019-06-04 ENCOUNTER — Inpatient Hospital Stay (HOSPITAL_BASED_OUTPATIENT_CLINIC_OR_DEPARTMENT_OTHER): Payer: Medicare Other | Admitting: Internal Medicine

## 2019-06-04 ENCOUNTER — Encounter: Payer: Self-pay | Admitting: Internal Medicine

## 2019-06-04 DIAGNOSIS — Z515 Encounter for palliative care: Secondary | ICD-10-CM | POA: Diagnosis not present

## 2019-06-04 DIAGNOSIS — C8599 Non-Hodgkin lymphoma, unspecified, extranodal and solid organ sites: Secondary | ICD-10-CM | POA: Diagnosis not present

## 2019-06-04 DIAGNOSIS — Z5111 Encounter for antineoplastic chemotherapy: Secondary | ICD-10-CM | POA: Diagnosis not present

## 2019-06-04 DIAGNOSIS — Z7189 Other specified counseling: Secondary | ICD-10-CM

## 2019-06-04 NOTE — Assessment & Plan Note (Addendum)
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  PET scan-left breast uptake; no distant disease noted.  Will hold off bone marrow biopsy/given patient/family preference.  Age adjusted IPI-cure rate 50 to 60%.  With treatment-anticipate response rates 70 to 80% along with radiation.   #Given patient's/family's extreme caution with proceeding with treatment given concerns for detriment to patient's quality of life from chemotherapy-all options were discussed including: #Curative option-R-CHOP x3 cycles followed by radiation versus-palliative rituxan- prednisone with radiation versus palliative radiation versus the supportive care.  I did not recommend the supportive care alone-as I would expect the lymphoma to grow and cause significant local skin breakdown/discomfort.  # #Had a long discussion with the patient/and family [son and daughter over the phone]-regarding the above treatment plan/prognosis etc.  Also discussed that it is reasonable that we could try the chemotherapy and assess patient's tolerance to therapy.  If patient has poor tolerance to therapy I think it is reasonable to withhold any further chemotherapy with a curative intent; and proceed with palliative options.  #Would recommend mini R-CHOP; given the prior exposure to Adriamycin/age. Discussed the potential side effects including but not limited to-increasing fatigue, nausea vomiting, diarrhea, hair loss, sores in the mouth, increase risk of infection and also neuropathy. Also discussed regarding potential side effects of heart failure/leukemia with Adriamycin.  2D echo- EF -65%.   # Question Alzheimer's-mild dementia-discussed with Dr. Otto Herb.Discussed the potential at least possible temporary effects of chemotherapy [prednisone/Benadryl].    # DISPOSITION: # follow up in Sep 21st 2020/Monday- MD; labs- cbc/cmp/LDH; Rituxan-Prednisone- Dr.B  # 40 minutes  face-to-face with the patient discussing the above plan of care; more than 50% of time spent on prognosis/ natural history; counseling and coordination.   Cc; Dr.Babaoff

## 2019-06-04 NOTE — Progress Notes (Signed)
[facility] CONSULT NOTE  Patient Care Team: Derinda Late, MD as PCP - General (Family Medicine)  CHIEF COMPLAINTS/PURPOSE OF CONSULTATION: Breast lymphoma   Oncology History Overview Note  # AUG 2020- LEFT BREAST DLBCL [non-GCB subtype; Double expressor;NEG- CD-30; Ki-67-80-90%]; FISH -negative for translocations; SEP 2020- PET-left breast intense uptake; no distant disease noted.  Hold off bone marrow biopsy [patient/family preference not to be aggressive]; Stage adjusted IPI-"2 points" cure rate 50-60%.   # mini-R-CHOP followed by IFRT -------------------------------------------------------------------------------------------- # 2006- DLBCL of Breast [s? 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    ;GOALS: Cure  CURRENT/MOST RECENT THERAPY: mini-RCHOP- followed by IFRT    Lymphoma of breast ([place])  05/25/2019 Initial Diagnosis   Lymphoma of breast ([place])   06/14/2019 -  Chemotherapy   The patient had DOXOrubicin (ADRIAMYCIN) chemo injection 36 mg, 25 mg/m2 = 36 mg (original dose ), Intravenous,  Once, 0 of 3 cycles Dose modification: 25 mg/m2 (Cycle 1, Reason: Provider Judgment) palonosetron (ALOXI) injection 0.25 mg, 0.25 mg, Intravenous,  Once, 0 of 3 cycles pegfilgrastim-jmdb (FULPHILA) injection 6 mg, 6 mg, Subcutaneous,  Once, 0 of 3 cycles vinCRIStine (ONCOVIN) 2 mg in sodium chloride 0.9 % 50 mL chemo infusion, 2 mg, Intravenous,  Once, 0 of 3 cycles riTUXimab (RITUXAN) 500 mg in sodium chloride 0.9 % 250 mL (1.6667 mg/mL) infusion, 375 mg/m2 = 500 mg, Intravenous,  Once, 0 of 3 cycles cyclophosphamide (CYTOXAN) 580 mg in sodium chloride 0.9 % 250 mL chemo infusion, 400 mg/m2 = 580 mg (original dose ), Intravenous,  Once, 0 of 3 cycles Dose modification: 600 mg/m2 (Cycle 1, Reason: Provider Judgment), 400 mg/m2 (Cycle 1, Reason: Provider Judgment) fosaprepitant (EMEND) 150 mg,  dexamethasone (DECADRON) 12 mg in sodium chloride 0.9 % 145 mL IVPB, , Intravenous,  Once, 0 of 3 cycles  for chemotherapy treatment.       HISTORY OF PRESENTING ILLNESS:  Stephanie Davidson 85 y.o.  female diffuse large B-cell lymphoma is here today with results of the PET scan/treatment plan.  In the interim patient had a Mediport placed.  Patient denies any significant worsening of the left breast mass.  No weight loss.  No night sweats.   Review of Systems  Constitutional: Negative for chills, diaphoresis, fever and malaise/fatigue.  HENT: Negative for nosebleeds and sore throat.   Eyes: Negative for double vision.  Respiratory: Negative for cough, hemoptysis, sputum production, shortness of breath and wheezing.   Cardiovascular: Negative for chest pain, palpitations, orthopnea and leg swelling.  Gastrointestinal: Negative for abdominal pain, blood in stool, constipation, diarrhea, heartburn, melena, nausea and vomiting.  Genitourinary: Negative for dysuria, frequency and urgency.  Musculoskeletal: Positive for back pain and joint pain.  Skin: Negative.  Negative for itching and rash.  Neurological: Negative for dizziness, tingling, focal weakness, weakness and headaches.  Endo/Heme/Allergies: Does not bruise/bleed easily.  Psychiatric/Behavioral: Positive for memory loss. Negative for depression. The patient is not nervous/anxious and does not have insomnia.      MEDICAL HISTORY:  Past Medical History:  Diagnosis Date  . Breast cancer ([place])   . Hypertension   . Personal history of chemotherapy   . Personal history of radiation therapy     SURGICAL HISTORY: Past Surgical History:  Procedure Laterality Date  . BREAST BIOPSY Right ?   needle bx-benign  . BREAST BIOPSY Left 05/12/2019   [place] bx 12:00, venus  marker, path pending  . BREAST EXCISIONAL BIOPSY Left 2003   lymphoma  . BREAST EXCISIONAL BIOPSY Left ?   lump was removed, benign  . IR IMAGING GUIDED PORT INSERTION   06/02/2019    SOCIAL HISTORY: Social History   Socioeconomic History  . Marital status: Single    Spouse name: Not on file  . Number of children: 2  . Years of education: Not on file  . Highest education level: Not on file  Occupational History  . Not on file  Social Needs  . Financial resource strain: Not hard at all  . Food insecurity    Worry: Never true    Inability: Never true  . Transportation needs    Medical: No    Non-medical: No  Tobacco Use  . Smoking status: Never Smoker  . Smokeless tobacco: Never Used  Substance and Sexual Activity  . Alcohol use: No  . Drug use: Not on file  . Sexual activity: Not on file  Lifestyle  . Physical activity    Days per week: 7 days    Minutes per session: 30 min  . Stress: Only a little  Relationships  . Social connections    Talks on phone: More than three times a week    Gets together: More than three times a week    Attends religious service: Not on file    Active member of club or organization: No    Attends meetings of clubs or organizations: Never    Relationship status: Not on file  . Intimate partner violence    Fear of current or ex partner: No    Emotionally abused: No    Physically abused: No    Forced sexual activity: No  Other Topics Concern  . Not on file  Social History Narrative   [place]; own apartment; no smoking; no alcohol. 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     FAMILY HISTORY: Family History  Problem Relation Age of Onset  . Breast cancer Neg Hx     ALLERGIES:  has No Known Allergies.  MEDICATIONS:  Current Outpatient Medications  Medication Sig Dispense Refill  . amLODipine (NORVASC) 10 MG tablet Take 1 tablet by mouth daily.    . Calcium Carbonate-Vitamin D (CALTRATE 600+D PO) Take 1 tablet by mouth 2 (two) times daily.    Marland Kitchen docusate calcium (SURFAK) 240 MG capsule Take 1 capsule by mouth 2 (two) times daily.    Marland Kitchen donepezil (ARICEPT) 10 MG tablet Take 1 tablet by mouth at bedtime.    . fluticasone  (FLONASE) 50 MCG/ACT nasal spray Place 2 sprays into both nostrils daily.    . hydrochlorothiazide (HYDRODIURIL) 25 MG tablet Take 1 tablet by mouth daily.    . irbesartan (AVAPRO) 300 MG tablet Take 1 tablet by mouth daily.    . meloxicam (MOBIC) 15 MG tablet Take 1 tablet by mouth as needed.    . Multiple Vitamin (MULTI-VITAMIN) tablet Take 1 tablet by mouth daily.    . Omega-3 Fatty Acids (FISH OIL) 1000 MG CAPS Take 1 tablet by mouth daily.    Marland Kitchen omeprazole (PRILOSEC) 20 MG capsule Take 1 capsule by mouth daily.    . traZODone (DESYREL) 50 MG tablet Take 1 tablet by mouth 2 (two) times daily at 8 am and 10 pm.     No current facility-administered medications for this visit.       Marland Kitchen  PHYSICAL EXAMINATION: ECOG PERFORMANCE STATUS: 1 - Symptomatic but completely ambulatory  Vitals:  06/04/19 0845  BP: 134/75  Pulse: 80  Resp: 18  Temp: 98.2 F (36.8 C)   There were no vitals filed for this visit.  Physical Exam  Constitutional: She is oriented to person, place, and time and well-developed, well-nourished, and in no distress.  HENT:  Head: Normocephalic and atraumatic.  Mouth/Throat: Oropharynx is clear and moist. No oropharyngeal exudate.  Eyes: Pupils are equal, round, and reactive to light.  Neck: Normal range of motion. Neck supple.  Cardiovascular: Normal rate and regular rhythm.  Pulmonary/Chest: Effort normal and breath sounds normal. No respiratory distress. She has no wheezes.  Abdominal: Soft. Bowel sounds are normal. She exhibits no distension and no mass. There is no abdominal tenderness. There is no rebound and no guarding.  Musculoskeletal: Normal range of motion.        General: No tenderness or edema.  Neurological: She is alert and oriented to person, place, and time.  Skin: Skin is warm.  Left breast upper inner quadrant-5.5 x 4.5 cm mobile mass noted.  Psychiatric: Affect normal.     LABORATORY DATA:  I have reviewed the data as listed Lab Results   Component Value Date   WBC 6.4 06/02/2019   HGB 12.9 06/02/2019   HCT 39.7 06/02/2019   MCV 91.3 06/02/2019   PLT 195 06/02/2019   Recent Labs    05/26/19 1237  NA 136  K 3.9  CL 99  CO2 29  GLUCOSE 108*  BUN 25*  CREATININE 0.82  CALCIUM 10.3  GFRNONAA >60  GFRAA >60  PROT 8.5*  ALBUMIN 4.9  AST 29  ALT 21  ALKPHOS 118  BILITOT 0.6    RADIOGRAPHIC STUDIES: I have personally reviewed the radiological images as listed and agreed with the findings in the report. Nm Pet Image Initial (pi) Skull Base To Thigh  Result Date: 06/03/2019 CLINICAL DATA:  Initial treatment strategy for lymphoma of left breast. Recent biopsy. History of left breast cancer in 2006. Chemotherapy and radiation therapy 16 years ago. EXAM: NUCLEAR MEDICINE PET SKULL BASE TO THIGH TECHNIQUE: 5.8 mCi F-18 FDG was injected intravenously. Full-ring PET imaging was performed from the skull base to thigh after the radiotracer. CT data was obtained and used for attenuation correction and anatomic localization. Fasting blood glucose: 99 mg/dl COMPARISON:  11/03/2012 PET FINDINGS: Mediastinal blood pool activity: SUV max 2.3 Liver activity: SUV max 3.1 NECK: Left palatine tonsil hypermetabolism is without CT correlate and favored to be physiologic. Low-level hypermetabolism corresponding to jugular chain nodes, likely reactive. Example left-sided node at 6 mm and a S.U.V. max of 2.9 on image 37/3. Incidental CT findings: Bilateral carotid atherosclerosis. No cervical adenopathy. CHEST: Medial left breast primary measures 3.6 x 2.5 cm and a S.U.V. max of 13.7 on image 103/3. No thoracic nodal or pulmonary parenchymal hypermetabolism identified. Incidental CT findings: Recently placed Port-A-Cath, given air along the course of the catheter. Terminates at the superior caval/atrial junction. Mild cardiomegaly, accentuated by a pectus excavatum deformity. Aortic and coronary artery atherosclerosis. No axillary adenopathy. 1.7 cm  right-sided thyroid nodule on 57/3 is similar in size back in 2014. Left upper lobe calcified granuloma. Mild anterior left upper lobe radiation induced fibrosis. ABDOMEN/PELVIS: No abdominopelvic parenchymal or nodal hypermetabolism. Incidental CT findings: Normal adrenal glands. Abdominal aortic atherosclerosis. Interpolar left renal 3.2 cm low-density lesion is most consistent with a cyst. High right hepatic lobe subcentimeter low-density lesion is also likely a cyst. Hysterectomy. SKELETON: No abnormal marrow activity. Incidental CT findings: Right proximal  femur fixation. Right hip osteoarthritis. IMPRESSION: 1. Medial left breast mass, consistent with the clinical history of lymphoma. No evidence of extrathoracic hypermetabolic lymphoma. 2. Coronary artery atherosclerosis. Aortic Atherosclerosis (ICD10-I70.0). Electronically Signed   By: Abigail Miyamoto M.D.   On: 06/03/2019 14:15   Mm Clip Placement Left  Result Date: 05/12/2019 CLINICAL DATA:  Post biopsy mammogram of the left breast for clip placement. EXAM: DIAGNOSTIC LEFT MAMMOGRAM POST ULTRASOUND BIOPSY COMPARISON:  Previous exam(s). FINDINGS: Mammographic images were obtained following ultrasound guided biopsy of a mass in the superior left breast. The Venus shaped biopsy marking clip is well positioned within the mass in the upper inner left breast. IMPRESSION: Appropriate positioning of the venous shaped biopsy marking clip in the upper inner left breast. Final Assessment: Post Procedure Mammograms for Marker Placement Electronically Signed   By: Ammie Ferrier M.D.   On: 05/12/2019 14:05   [place] Lt Breast Bx W Loc Dev 1st Lesion Img Bx Spec US Guide  Addendum Date: 05/19/2019   ADDENDUM REPORT: 05/19/2019 11:21 ADDENDUM: PATHOLOGY revealed: A. BREAST, LEFT AT 12:00, 6 CM FROM THE NIPPLE; ULTRASOUND GUIDED CORE BIOPSY: -DIFFUSE LARGE B CELL LYMPHOMA, FURTHER CLASSIFICATION PENDING IMMUNOHISTOCHEMICAL WORKUP. Comment: Sections display fibroadipose  tissue with a dense cellular infiltrate comprised of large cells with high N:C ratio and significant nuclear atypia. Mitotic figures are frequent, and numerous apoptotic bodies are present. Residual mammary parenchyma is not identified. Immunohistochemical studies with appropriately reactive controls were performed, and show these abnormal cells to be positive for CD45 (LCA) and CD20, compatible with B cells. A superpancytokeratin is negative. 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 Additional immunohistochemical and FISH studies to further classify this process are pending, and will be reported as an addendum. There is sufficient tissue for ancillary testing in both tissue blocks. Pathology results are CONCORDANT with imaging findings, per Dr. Ammie Ferrier. Pathology results were discussed with patient, and patient's daughter Becky Sax), via telephone. The patient reported doing well after the biopsy with tenderness at the site. Post biopsy care instructions were reviewed and questions were answered. The patient was encouraged to call [facility] for any additional concerns. Patient's final pathology report was faxed to [facility] RN at Dr. Otilio Connors office. Recommendation: Referral for medical oncologist will be arranged by Wendy Poet RN, with Dr. Otilio Connors office. Patient was instructed to continue with monthly self breast examinations, clinical follow-up as needed, and to have annual bilateral screening mammogram in one year. Addendum by Electa Sniff RN on 05/19/2019. Electronically Signed   By: Ammie Ferrier M.D.   On: 05/19/2019 11:21   Result Date: 05/19/2019 CLINICAL DATA:  85 year old female presenting for ultrasound-guided biopsy of a left breast mass. EXAM: ULTRASOUND GUIDED LEFT BREAST CORE NEEDLE BIOPSY COMPARISON:  Previous exam(s). FINDINGS: I met with the patient and we discussed the  procedure of ultrasound-guided biopsy, including benefits and alternatives. We discussed the high likelihood of a successful procedure. We discussed the risks of the procedure, including infection, bleeding, tissue injury, clip migration, and inadequate sampling. Informed written consent was given. The usual time-out protocol was performed immediately prior to the procedure. Lesion quadrant: Upper inner quadrant Using sterile technique and 1% Lidocaine as local anesthetic, under direct ultrasound visualization, a 14 gauge spring-loaded device was used to perform biopsy of a mass in the left breast at 11-12 o'clock using an inferior approach. At the conclusion of the procedure a venous  shaped tissue marker clip was deployed into the biopsy cavity. Follow up 2 view mammogram was performed and dictated separately. IMPRESSION: Ultrasound guided biopsy of a left breast mass at 12 o'clock. No apparent complications. Electronically Signed: By: Ammie Ferrier M.D. On: 05/12/2019 13:36   Ir Imaging Guided Port Insertion  Result Date: 06/03/2019 INDICATION: 85 year old female referred for port catheter EXAM: IMPLANTED PORT A CATH PLACEMENT WITH ULTRASOUND AND FLUOROSCOPIC GUIDANCE MEDICATIONS: 2 g Ancef; The antibiotic was administered within an appropriate time interval prior to skin puncture. ANESTHESIA/SEDATION: Moderate (conscious) sedation was employed during this procedure. A total of Versed 2.0 mg and Fentanyl 100 mcg was administered intravenously. Moderate Sedation Time: 19 minutes. The patient's level of consciousness and vital signs were monitored continuously by radiology nursing throughout the procedure under my direct supervision. FLUOROSCOPY TIME:  0 minutes, 6 seconds (4.09 mGy) COMPLICATIONS: None PROCEDURE: The procedure, risks, benefits, and alternatives were explained to the patient. Questions regarding the procedure were encouraged and answered. The patient understands and consents to the  procedure. Ultrasound survey was performed with images stored and sent to PACs. The right neck and chest was prepped with chlorhexidine, and draped in the usual sterile fashion using maximum barrier technique (cap and mask, sterile gown, sterile gloves, large sterile sheet, hand hygiene and cutaneous antiseptic). Antibiotic prophylaxis was provided with 2.0g Ancef administered IV one hour prior to skin incision. Local anesthesia was attained by infiltration with 1% lidocaine without epinephrine. Ultrasound demonstrated patency of the right internal jugular vein, and this was documented with an image. Under real-time ultrasound guidance, this vein was accessed with a 21 gauge micropuncture needle and image documentation was performed. A small dermatotomy was made at the access site with an 11 scalpel. A 0.018" wire was advanced into the SVC and used to estimate the length of the internal catheter. The access needle exchanged for a 36F micropuncture vascular sheath. The 0.018" wire was then removed and a 0.035" wire advanced into the IVC. An appropriate location for the subcutaneous reservoir was selected below the clavicle and an incision was made through the skin and underlying soft tissues. The subcutaneous tissues were then dissected using a combination of blunt and sharp surgical technique and a pocket was formed. A single lumen power injectable portacatheter was then tunneled through the subcutaneous tissues from the pocket to the dermatotomy and the port reservoir placed within the subcutaneous pocket. The venous access site was then serially dilated and a peel away vascular sheath placed over the wire. The wire was removed and the port catheter advanced into position under fluoroscopic guidance. The catheter tip is positioned in the cavoatrial junction. This was documented with a spot image. The portacatheter was then tested and found to flush and aspirate well. The port was flushed with saline followed by 100  units/mL heparinized saline. The pocket was then closed in two layers using first subdermal inverted interrupted absorbable sutures followed by a running subcuticular suture. The epidermis was then sealed with Dermabond. The dermatotomy at the venous access site was also seal with Dermabond. Patient tolerated the procedure well and remained hemodynamically stable throughout. No complications encountered and no significant blood loss encountered IMPRESSION: Status post right IJ port catheter placement. Catheter ready for use. Signed, Dulcy Fanny. Dellia Nims, RPVI [facility] Electronically Signed   By: Corrie Mckusick D.O.   On: 06/03/2019 08:10    ASSESSMENT & PLAN:   Lymphoma of breast ([place]) #Diffuse large B-cell lymphoma  left breast Non-GCB subtype; double expresser; FISH studies-negative for translocation.  PET scan-left breast uptake; no distant disease noted.  Will hold off bone marrow biopsy/given patient/family preference.  Age adjusted IPI-cure rate 50 to 60%.  With treatment-anticipate response rates 70 to 80% along with radiation.   #Given patient's/family's extreme caution with proceeding with treatment given concerns for detriment to patient's quality of life from chemotherapy-all options were discussed including: #Curative option-R-CHOP x3 cycles followed by radiation versus-palliative rituxan- prednisone with radiation versus palliative radiation versus the supportive care.  I did not recommend the supportive care alone-as I would expect the lymphoma to grow and cause significant local skin breakdown/discomfort.  # #Had a long discussion with the patient/and family [son and daughter over the phone]-regarding the above treatment plan/prognosis etc.  Also discussed that it is reasonable that we could try the chemotherapy and assess patient's tolerance to therapy.  If patient has poor tolerance to therapy I think it is reasonable to withhold  any further chemotherapy with a curative intent; and proceed with palliative options.  #Would recommend mini R-CHOP; given the prior exposure to Adriamycin/age. Discussed the potential side effects including but not limited to-increasing fatigue, nausea vomiting, diarrhea, hair loss, sores in the mouth, increase risk of infection and also neuropathy. Also discussed regarding potential side effects of heart failure/leukemia with Adriamycin.  2D echo- EF -65%.   # Question Alzheimer's-mild dementia-discussed with Dr. Otto Herb.Discussed the potential at least possible temporary effects of chemotherapy [prednisone/Benadryl].    # DISPOSITION: # follow up in Sep 21st 2020/Monday- MD; labs- cbc/cmp/LDH; Rituxan-Prednisone- Dr.B  # 40 minutes face-to-face with the patient discussing the above plan of care; more than 50% of time spent on prognosis/ natural history; counseling and coordination.   Cc; Dr.Babaoff  All questions were answered. The patient knows to call the clinic with any problems, questions or concerns.    Cammie Sickle, MD 06/06/2019 8:22 PM

## 2019-06-04 NOTE — Progress Notes (Signed)
[place]  Telephone:([phone] [phone]   Name: Stephanie Davidson Date: 06/04/2019 MRN: [id number]  DOB: 1931/04/18  Patient Care Team: Derinda Late, MD as PCP - General (Family Medicine)    REASON FOR CONSULTATION: Palliative Care consult requested for this 85 y.o. 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  Patient had severe symptoms from previous treatment and was initially reluctant to pursue further cancer treatment.  Patient was referred to palliative care to help address goals and provide with support.   SOCIAL HISTORY:     reports that she has never smoked. She has never used smokeless tobacco. She reports that she does not drink alcohol.   Patient is widowed.  She lives at [facility] in independent living.  She has a son and [place] and a daughter in [place].  ADVANCE DIRECTIVES:  Not on file.  Her son, [name], is her healthcare power of attorney and her daughter, [name], is her financial POA.  Patient reportedly has a living will.  CODE STATUS:   PAST MEDICAL HISTORY: Past Medical History:  Diagnosis Date   Breast cancer ([place])    Hypertension    Personal history of chemotherapy    Personal history of radiation therapy     PAST SURGICAL HISTORY:  Past Surgical History:  Procedure Laterality Date   BREAST BIOPSY Right ?   needle bx-benign   BREAST BIOPSY Left 05/12/2019   [place] bx 12:00, venus marker, path pending   BREAST EXCISIONAL BIOPSY Left 2003   lymphoma   BREAST EXCISIONAL BIOPSY Left ?   lump was removed, benign   IR IMAGING GUIDED PORT INSERTION  06/02/2019    HEMATOLOGY/ONCOLOGY HISTORY:  Oncology History Overview Note  # AUG 2020- LEFT BREAST DLBCL [non-GCB subtype; Double expressor;NEG- CD-30; Ki-67-80-90%]; FISH -negative for translocations; SEP  2020- PET-left breast intense uptake; no distant disease noted.  Hold off bone marrow biopsy [patient/family preference not to be aggressive]   -------------------------------------------------------------------------------------------- # 2006- DLBCL of Breast [s? 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  MEDICATIONS:  Current Outpatient Medications  Medication Sig Dispense Refill   amLODipine (NORVASC) 10 MG tablet Take 1 tablet by mouth daily.     Calcium Carbonate-Vitamin D (CALTRATE 600+D PO) Take 1 tablet by mouth 2 (two) times daily.     docusate calcium (SURFAK) 240 MG capsule Take 1 capsule by mouth 2 (two) times daily.     donepezil (ARICEPT) 10 MG tablet Take 1 tablet by mouth at bedtime.     fluticasone (FLONASE) 50 MCG/ACT nasal spray Place 2 sprays into both nostrils daily.     hydrochlorothiazide (HYDRODIURIL) 25 MG tablet Take 1 tablet by mouth daily.     irbesartan (AVAPRO) 300 MG tablet Take 1 tablet by mouth daily.     meloxicam (MOBIC) 15 MG tablet Take 1 tablet by mouth as needed.     Multiple Vitamin (MULTI-VITAMIN) tablet Take 1 tablet by mouth daily.     Omega-3 Fatty Acids (FISH OIL) 1000 MG CAPS Take 1 tablet by mouth daily.  omeprazole (PRILOSEC) 20 MG capsule Take 1 capsule by mouth daily.     traZODone (DESYREL) 50 MG tablet Take 1 tablet by mouth 2 (two) times daily at 8 am and 10 pm.     No current facility-administered medications for this visit.     VITAL SIGNS: There were no vitals taken for this visit. There were no vitals filed for this visit.  Estimated body mass index is 19.84 kg/m as calculated from the following:   Height as of 06/02/19: _0  (1.549 m).   Weight as of  06/02/19: 105 lb (47.6 kg).  LABS: CBC:    Component Value Date/Time   WBC 6.4 06/02/2019 1411   HGB 12.9 06/02/2019 1411   HGB 14.1 10/28/2012 0950   HCT 39.7 06/02/2019 1411   HCT 41.4 10/28/2012 0950   PLT 195 06/02/2019 1411   PLT 200 10/28/2012 0950   MCV 91.3 06/02/2019 1411   MCV 92 10/28/2012 0950   NEUTROABS 4.7 05/26/2019 1237   NEUTROABS 3.8 10/28/2012 0950   LYMPHSABS 1.8 05/26/2019 1237   LYMPHSABS 1.4 10/28/2012 0950   MONOABS 0.4 05/26/2019 1237   MONOABS 0.4 10/28/2012 0950   EOSABS 0.2 05/26/2019 1237   EOSABS 0.2 10/28/2012 0950   BASOSABS 0.0 05/26/2019 1237   BASOSABS 0.0 10/28/2012 0950   Comprehensive Metabolic Panel:    Component Value Date/Time   NA 136 05/26/2019 1237   NA 139 10/28/2012 0950   K 3.9 05/26/2019 1237   K 3.4 (L) 02/18/2013 1508   CL 99 05/26/2019 1237   CL 98 10/28/2012 0950   CO2 29 05/26/2019 1237   CO2 33 (H) 10/28/2012 0950   BUN 25 (H) 05/26/2019 1237   BUN 16 10/28/2012 0950   CREATININE 0.82 05/26/2019 1237   CREATININE 0.74 10/28/2012 0950   GLUCOSE 108 (H) 05/26/2019 1237   GLUCOSE 105 (H) 10/28/2012 0950   CALCIUM 10.3 05/26/2019 1237   CALCIUM 10.0 10/28/2012 0950   AST 29 05/26/2019 1237   AST 26 10/28/2012 0950   ALT 21 05/26/2019 1237   ALT 28 10/28/2012 0950   ALKPHOS 118 05/26/2019 1237   ALKPHOS 142 (H) 10/28/2012 0950   BILITOT 0.6 05/26/2019 1237   BILITOT 0.4 10/28/2012 0950   PROT 8.5 (H) 05/26/2019 1237   PROT 8.2 10/28/2012 0950   ALBUMIN 4.9 05/26/2019 1237   ALBUMIN 4.0 10/28/2012 0950    RADIOGRAPHIC STUDIES: Nm Pet Image Initial (pi) Skull Base To Thigh  Result Date: 06/03/2019 CLINICAL DATA:  Initial treatment strategy for lymphoma of left breast. Recent biopsy. History of left breast cancer in 2006. Chemotherapy and radiation therapy 16 years ago. EXAM: NUCLEAR MEDICINE PET SKULL BASE TO THIGH TECHNIQUE: 5.8 mCi F-18 FDG was injected intravenously. Full-ring PET imaging was performed from  the skull base to thigh after the radiotracer. CT data was obtained and used for attenuation correction and anatomic localization. Fasting blood glucose: 99 mg/dl COMPARISON:  11/03/2012 PET FINDINGS: Mediastinal blood pool activity: SUV max 2.3 Liver activity: SUV max 3.1 NECK: Left palatine tonsil hypermetabolism is without CT correlate and favored to be physiologic. Low-level hypermetabolism corresponding to jugular chain nodes, likely reactive. Example left-sided node at 6 mm and a S.U.V. max of 2.9 on image 37/3. Incidental CT findings: Bilateral carotid atherosclerosis. No cervical adenopathy. CHEST: Medial left breast primary measures 3.6 x 2.5 cm and a S.U.V. max of 13.7 on image 103/3. No thoracic nodal or pulmonary parenchymal hypermetabolism identified. Incidental CT findings: Recently placed  Port-A-Cath, given air along the course of the catheter. Terminates at the superior caval/atrial junction. Mild cardiomegaly, accentuated by a pectus excavatum deformity. Aortic and coronary artery atherosclerosis. No axillary adenopathy. 1.7 cm right-sided thyroid nodule on 57/3 is similar in size back in 2014. Left upper lobe calcified granuloma. Mild anterior left upper lobe radiation induced fibrosis. ABDOMEN/PELVIS: No abdominopelvic parenchymal or nodal hypermetabolism. Incidental CT findings: Normal adrenal glands. Abdominal aortic atherosclerosis. Interpolar left renal 3.2 cm low-density lesion is most consistent with a cyst. High right hepatic lobe subcentimeter low-density lesion is also likely a cyst. Hysterectomy. SKELETON: No abnormal marrow activity. Incidental CT findings: Right proximal femur fixation. Right hip osteoarthritis. IMPRESSION: 1. Medial left breast mass, consistent with the clinical history of lymphoma. No evidence of extrathoracic hypermetabolic lymphoma. 2. Coronary artery atherosclerosis. Aortic Atherosclerosis (ICD10-I70.0). Electronically Signed   By: Abigail Miyamoto M.D.   On:  06/03/2019 14:15   Mm Clip Placement Left  Result Date: 05/12/2019 CLINICAL DATA:  Post biopsy mammogram of the left breast for clip placement. EXAM: DIAGNOSTIC LEFT MAMMOGRAM POST ULTRASOUND BIOPSY COMPARISON:  Previous exam(s). FINDINGS: Mammographic images were obtained following ultrasound guided biopsy of a mass in the superior left breast. The Venus shaped biopsy marking clip is well positioned within the mass in the upper inner left breast. IMPRESSION: Appropriate positioning of the venous shaped biopsy marking clip in the upper inner left breast. Final Assessment: Post Procedure Mammograms for Marker Placement Electronically Signed   By: Ammie Ferrier M.D.   On: 05/12/2019 14:05   [place] Lt Breast Bx W Loc Dev 1st Lesion Img Bx Spec US Guide  Addendum Date: 05/19/2019   ADDENDUM REPORT: 05/19/2019 11:21 ADDENDUM: PATHOLOGY revealed: A. BREAST, LEFT AT 12:00, 6 CM FROM THE NIPPLE; ULTRASOUND GUIDED CORE BIOPSY: -DIFFUSE LARGE B CELL LYMPHOMA, FURTHER CLASSIFICATION PENDING IMMUNOHISTOCHEMICAL WORKUP. Comment: Sections display fibroadipose tissue with a dense cellular infiltrate comprised of large cells with high N:C ratio and significant nuclear atypia. Mitotic figures are frequent, and numerous apoptotic bodies are present. Residual mammary parenchyma is not identified. Immunohistochemical studies with appropriately reactive controls were performed, and show these abnormal cells to be positive for CD45 (LCA) and CD20, compatible with B cells. A superpancytokeratin is negative. 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 Additional immunohistochemical and FISH studies to further classify this process are pending, and will be reported as an addendum. There is sufficient tissue for ancillary testing in both tissue blocks. Pathology results are CONCORDANT with imaging findings, per Dr. Ammie Ferrier. Pathology results were discussed with  patient, and patient's daughter Becky Sax), via telephone. The patient reported doing well after the biopsy with tenderness at the site. Post biopsy care instructions were reviewed and questions were answered. The patient was encouraged to call [facility] for any additional concerns. Patient's final pathology report was faxed to [facility] RN at Dr. Otilio Connors office. Recommendation: Referral for medical oncologist will be arranged by Wendy Poet RN, with Dr. Otilio Connors office. Patient was instructed to continue with monthly self breast examinations, clinical follow-up as needed, and to have annual bilateral screening mammogram in one year. Addendum by Electa Sniff RN on 05/19/2019. Electronically Signed   By: Ammie Ferrier M.D.   On: 05/19/2019 11:21   Result Date: 05/19/2019 CLINICAL DATA:  85 year old female presenting for ultrasound-guided biopsy of a left breast mass. EXAM: ULTRASOUND GUIDED LEFT BREAST CORE NEEDLE BIOPSY COMPARISON:  Previous exam(s).  FINDINGS: I met with the patient and we discussed the procedure of ultrasound-guided biopsy, including benefits and alternatives. We discussed the high likelihood of a successful procedure. We discussed the risks of the procedure, including infection, bleeding, tissue injury, clip migration, and inadequate sampling. Informed written consent was given. The usual time-out protocol was performed immediately prior to the procedure. Lesion quadrant: Upper inner quadrant Using sterile technique and 1% Lidocaine as local anesthetic, under direct ultrasound visualization, a 14 gauge spring-loaded device was used to perform biopsy of a mass in the left breast at 11-12 o'clock using an inferior approach. At the conclusion of the procedure a venous shaped tissue marker clip was deployed into the biopsy cavity. Follow up 2 view mammogram was performed and dictated separately. IMPRESSION: Ultrasound guided biopsy of a left breast  mass at 12 o'clock. No apparent complications. Electronically Signed: By: Ammie Ferrier M.D. On: 05/12/2019 13:36   Ir Imaging Guided Port Insertion  Result Date: 06/03/2019 INDICATION: 85 year old female referred for port catheter EXAM: IMPLANTED PORT A CATH PLACEMENT WITH ULTRASOUND AND FLUOROSCOPIC GUIDANCE MEDICATIONS: 2 g Ancef; The antibiotic was administered within an appropriate time interval prior to skin puncture. ANESTHESIA/SEDATION: Moderate (conscious) sedation was employed during this procedure. A total of Versed 2.0 mg and Fentanyl 100 mcg was administered intravenously. Moderate Sedation Time: 19 minutes. The patient's level of consciousness and vital signs were monitored continuously by radiology nursing throughout the procedure under my direct supervision. FLUOROSCOPY TIME:  0 minutes, 6 seconds (5.18 mGy) COMPLICATIONS: None PROCEDURE: The procedure, risks, benefits, and alternatives were explained to the patient. Questions regarding the procedure were encouraged and answered. The patient understands and consents to the procedure. Ultrasound survey was performed with images stored and sent to PACs. The right neck and chest was prepped with chlorhexidine, and draped in the usual sterile fashion using maximum barrier technique (cap and mask, sterile gown, sterile gloves, large sterile sheet, hand hygiene and cutaneous antiseptic). Antibiotic prophylaxis was provided with 2.0g Ancef administered IV one hour prior to skin incision. Local anesthesia was attained by infiltration with 1% lidocaine without epinephrine. Ultrasound demonstrated patency of the right internal jugular vein, and this was documented with an image. Under real-time ultrasound guidance, this vein was accessed with a 21 gauge micropuncture needle and image documentation was performed. A small dermatotomy was made at the access site with an 11 scalpel. A 0.018" wire was advanced into the SVC and used to estimate the length of  the internal catheter. The access needle exchanged for a 69F micropuncture vascular sheath. The 0.018" wire was then removed and a 0.035" wire advanced into the IVC. An appropriate location for the subcutaneous reservoir was selected below the clavicle and an incision was made through the skin and underlying soft tissues. The subcutaneous tissues were then dissected using a combination of blunt and sharp surgical technique and a pocket was formed. A single lumen power injectable portacatheter was then tunneled through the subcutaneous tissues from the pocket to the dermatotomy and the port reservoir placed within the subcutaneous pocket. The venous access site was then serially dilated and a peel away vascular sheath placed over the wire. The wire was removed and the port catheter advanced into position under fluoroscopic guidance. The catheter tip is positioned in the cavoatrial junction. This was documented with a spot image. The portacatheter was then tested and found to flush and aspirate well. The port was flushed with saline followed by 100 units/mL heparinized saline. The pocket was then  closed in two layers using first subdermal inverted interrupted absorbable sutures followed by a running subcuticular suture. The epidermis was then sealed with Dermabond. The dermatotomy at the venous access site was also seal with Dermabond. Patient tolerated the procedure well and remained hemodynamically stable throughout. No complications encountered and no significant blood loss encountered IMPRESSION: Status post right IJ port catheter placement. Catheter ready for use. Signed, Dulcy Fanny. Dellia Nims, RPVI [facility] Electronically Signed   By: Corrie Mckusick D.O.   On: 06/03/2019 08:10    PERFORMANCE STATUS (ECOG) : 0 - Asymptomatic  Review of Systems Unless otherwise noted, a complete review of systems is negative.  Physical Exam General: NAD, frail  appearing, thin Pulmonary: Unlabored Extremities: no edema, no joint deformities Skin: no rashes Neurological: Weakness but otherwise nonfocal  IMPRESSION: I met with patient today to discuss goals.  Patient's daughter and son participated in the conversation via phone.  Introduced palliative care services and attempted to establish therapeutic rapport.  At baseline, patient lives in an apartment and is functionally independent with her ADLs.  She does have mild dementia and family have noted some cognitive changes over the past couple of months that they attribute to isolation from COVID-19.  For example, family discovered that patient was not taking her Aricept daily.  Family have been in contact with [facility] and plan to utilize their care team as needed to ensure that patient has in-home support.  They are planning to use a new pharmacy that can fill a pillbox.  Patient and family have discussed options for pursuing treatment of her lymphoma.  Patient previously received R-CHOP chemotherapy and did not like the side effects.  Patient volunteers that she would be willing to forego treatment if the symptom burden was too high even if it resulted in a shortened life expectancy.  Patient did verbalize an interest in treatment if the symptom burden did not result in a reduction to her quality of life.  Patient is felt to have an aggressive lymphoma but could potentially result in a cure through treatment.  Symptomatically, she seems comfortable without any reported distressing symptoms.  She still walks several miles each day around her neighborhood.  We did discuss advance care planning.  Patient has an established living will and children are her power of attorneys.  It does not sound like patient would want to be resuscitated or have heroic measures at end-of-life.  I sent a MOST Form home for family to review.  We will plan to complete during a future visit.  Case and plan discussed with  Dr. Rogue Bussing  PLAN: -Continue current scope of treatment -MOST form reviewed -Home-based PC referral -RTC in 2 weeks   Patient expressed understanding and was in agreement with this plan. She also understands that She can call the clinic at any time with any questions, concerns, or complaints.     Time Total: 60 minutes  Visit consisted of counseling and education dealing with the complex and emotionally intense issues of symptom management and palliative care in the setting of serious and potentially life-threatening illness.Greater than 50%  of this time was spent counseling and coordinating care related to the above assessment and plan.  Signed by: Altha Harm, PhD, NP-C [phone] (Work Cell)

## 2019-06-06 ENCOUNTER — Telehealth: Payer: Self-pay | Admitting: Internal Medicine

## 2019-06-06 NOTE — Progress Notes (Signed)
START ON PATHWAY REGIMEN - Lymphoma and CLL     A cycle is every 21 days:     Rituximab-xxxx      Rituximab and hyaluronidase human      Cyclophosphamide      Doxorubicin      Vincristine      Prednisone   **Always confirm dose/schedule in your pharmacy ordering system**  Patient Characteristics: Diffuse Large B-Cell Lymphoma or Follicular Lymphoma, Grade 3B, First Line, Stage I and II, No Bulk Disease Type: Not Applicable Disease Type: Diffuse Large B-Cell Lymphoma Disease Type: Not Applicable Line of therapy: First Line [place] Stage: IE Disease Characteristics: No Bulk Intent of Therapy: Curative Intent, Discussed with Patient

## 2019-06-06 NOTE — Telephone Encounter (Signed)
Heather/Brooke- please check on the status of previous records from [facility] [2004-2006]/ or atleast last available hem-onc notes.  GB

## 2019-06-07 NOTE — Telephone Encounter (Signed)
[place]. Medical records request was sent on 04/26/19. I contacted Everlyn in HIM at [facility] and she states that she did not receive fax.  I faxed request again and wrote STAT on the fax form, and confirmed with Everlyn that fax was received.

## 2019-06-08 ENCOUNTER — Telehealth: Payer: Self-pay | Admitting: Adult Health Nurse Practitioner

## 2019-06-08 NOTE — Telephone Encounter (Signed)
Spoke with patient regarding Palliative services and she requested that I call her daughter [name] and explain these services to her and then I could schedule the Consult with her if she was in agreement.  Called daughter and after discussing Palliative services she was in agreement with this.  I have scheduled an in-person Palliative Consult for 06/17/19 @ 9 AM.

## 2019-06-11 ENCOUNTER — Telehealth: Payer: Self-pay | Admitting: *Deleted

## 2019-06-11 ENCOUNTER — Other Ambulatory Visit: Payer: Self-pay | Admitting: *Deleted

## 2019-06-11 DIAGNOSIS — C8599 Non-Hodgkin lymphoma, unspecified, extranodal and solid organ sites: Secondary | ICD-10-CM

## 2019-06-11 MED ORDER — ONDANSETRON HCL 8 MG PO TABS: 8.0000 mg | ORAL_TABLET | Freq: Two times a day (BID) | ORAL | 1 refills | Status: DC | PRN

## 2019-06-11 MED ORDER — PREDNISONE 20 MG PO TABS: 60.0000 mg | ORAL_TABLET | Freq: Every day | ORAL | 1 refills | Status: DC

## 2019-06-11 MED ORDER — LIDOCAINE-PRILOCAINE 2.5-2.5 % EX CREA: TOPICAL_CREAM | CUTANEOUS | 3 refills | Status: DC

## 2019-06-11 MED ORDER — PROCHLORPERAZINE MALEATE 10 MG PO TABS: 10.0000 mg | ORAL_TABLET | Freq: Four times a day (QID) | ORAL | 6 refills | Status: DC | PRN

## 2019-06-11 NOTE — Telephone Encounter (Signed)
Per daughter pt's Scripts for emla cream and antiemetics was inadvertently sent to [facility]. (which is not in pt's pharmacy list).  Pt's scripts resubmitted to [facility].

## 2019-06-11 NOTE — Telephone Encounter (Signed)
Spoke with daughter. Patient needs premeds/antiemetics/emla cream sent to [facility] pharmacy. Patient is scheduled for rituxan on Monday. V/o given and signed for premeds by Dr. Rogue Bussing. Scripts sent per md orders. Daughter gave verbal understanding.

## 2019-06-13 ENCOUNTER — Encounter: Payer: Self-pay | Admitting: Internal Medicine

## 2019-06-13 ENCOUNTER — Other Ambulatory Visit: Payer: Self-pay

## 2019-06-13 NOTE — Progress Notes (Signed)
Patient stated that she had been doing well with no complaints. 

## 2019-06-14 ENCOUNTER — Inpatient Hospital Stay: Payer: Medicare Other

## 2019-06-14 ENCOUNTER — Other Ambulatory Visit: Payer: Self-pay

## 2019-06-14 ENCOUNTER — Inpatient Hospital Stay (HOSPITAL_BASED_OUTPATIENT_CLINIC_OR_DEPARTMENT_OTHER): Payer: Medicare Other | Admitting: Hospice and Palliative Medicine

## 2019-06-14 ENCOUNTER — Inpatient Hospital Stay (HOSPITAL_BASED_OUTPATIENT_CLINIC_OR_DEPARTMENT_OTHER): Payer: Medicare Other | Admitting: Internal Medicine

## 2019-06-14 ENCOUNTER — Inpatient Hospital Stay: Payer: Medicare Other | Admitting: *Deleted

## 2019-06-14 VITALS — BP 126/59 | HR 81 | Temp 95.8°F | Resp 16

## 2019-06-14 DIAGNOSIS — Z5111 Encounter for antineoplastic chemotherapy: Secondary | ICD-10-CM | POA: Diagnosis not present

## 2019-06-14 DIAGNOSIS — C8599 Non-Hodgkin lymphoma, unspecified, extranodal and solid organ sites: Secondary | ICD-10-CM

## 2019-06-14 DIAGNOSIS — Z515 Encounter for palliative care: Secondary | ICD-10-CM

## 2019-06-14 DIAGNOSIS — Z95828 Presence of other vascular implants and grafts: Secondary | ICD-10-CM

## 2019-06-14 LAB — COMPREHENSIVE METABOLIC PANEL
ALT: 19 U/L (ref 0–44)
AST: 24 U/L (ref 15–41)
Albumin: 4.3 g/dL (ref 3.5–5.0)
Alkaline Phosphatase: 102 U/L (ref 38–126)
Anion gap: 9 (ref 5–15)
BUN: 18 mg/dL (ref 8–23)
CO2: 25 mmol/L (ref 22–32)
Calcium: 9.5 mg/dL (ref 8.9–10.3)
Chloride: 100 mmol/L (ref 98–111)
Creatinine, Ser: 0.86 mg/dL (ref 0.44–1.00)
GFR calc Af Amer: >60 mL/min (ref >60)
GFR calc non Af Amer: >60 mL/min (ref >60)
Glucose, Bld: 110 mg/dL — ABNORMAL HIGH (ref 70–99)
Potassium: 4 mmol/L (ref 3.5–5.1)
Sodium: 134 mmol/L — ABNORMAL LOW (ref 135–145)
Total Bilirubin: 0.5 mg/dL (ref 0.3–1.2)
Total Protein: 7.4 g/dL (ref 6.5–8.1)

## 2019-06-14 LAB — CBC WITH DIFFERENTIAL/PLATELET
Abs Immature Granulocytes: 0.02 10*3/uL (ref 0.00–0.07)
Basophils Absolute: 0 10*3/uL (ref 0.0–0.1)
Basophils Relative: 0 %
Eosinophils Absolute: 0.1 10*3/uL (ref 0.0–0.5)
Eosinophils Relative: 2 %
HCT: 38.1 % (ref 36.0–46.0)
Hemoglobin: 12.7 g/dL (ref 12.0–15.0)
Immature Granulocytes: 0 %
Lymphocytes Relative: 13 %
Lymphs Abs: 0.8 10*3/uL (ref 0.7–4.0)
MCH: 30 pg (ref 26.0–34.0)
MCHC: 33.3 g/dL (ref 30.0–36.0)
MCV: 90.1 fL (ref 80.0–100.0)
Monocytes Absolute: 0.1 10*3/uL (ref 0.1–1.0)
Monocytes Relative: 2 %
Neutro Abs: 5.2 10*3/uL (ref 1.7–7.7)
Neutrophils Relative %: 83 %
Platelets: 160 10*3/uL (ref 150–400)
RBC: 4.23 MIL/uL (ref 3.87–5.11)
RDW: 13 % (ref 11.5–15.5)
WBC: 6.3 10*3/uL (ref 4.0–10.5)
nRBC: 0 % (ref 0.0–0.2)

## 2019-06-14 LAB — LACTATE DEHYDROGENASE: LDH: 250 U/L — ABNORMAL HIGH (ref 98–192)

## 2019-06-14 MED ORDER — HEPARIN SOD (PORK) LOCK FLUSH 100 UNIT/ML IV SOLN
500.0000 [IU] | Freq: Once | INTRAVENOUS | Status: AC | PRN
  Administered 2019-06-14: 15:00:00 500 [IU]
  Filled 2019-06-14: qty 5

## 2019-06-14 MED ORDER — SODIUM CHLORIDE 0.9% FLUSH
10.0000 mL | Freq: Once | INTRAVENOUS | Status: AC
  Administered 2019-06-14: 10 mL via INTRAVENOUS
  Filled 2019-06-14: qty 10

## 2019-06-14 MED ORDER — SODIUM CHLORIDE 0.9 % IV SOLN
Freq: Once | INTRAVENOUS | Status: AC
  Administered 2019-06-14: 10:00:00 via INTRAVENOUS
  Filled 2019-06-14: qty 250

## 2019-06-14 MED ORDER — ACETAMINOPHEN 325 MG PO TABS
650.0000 mg | ORAL_TABLET | Freq: Once | ORAL | Status: AC
  Administered 2019-06-14: 11:00:00 650 mg via ORAL
  Filled 2019-06-14: qty 2

## 2019-06-14 MED ORDER — DIPHENHYDRAMINE HCL 25 MG PO CAPS
50.0000 mg | ORAL_CAPSULE | Freq: Once | ORAL | Status: AC
  Administered 2019-06-14: 11:00:00 50 mg via ORAL
  Filled 2019-06-14: qty 2

## 2019-06-14 MED ORDER — SODIUM CHLORIDE 0.9 % IV SOLN
375.0000 mg/m2 | Freq: Once | INTRAVENOUS | Status: AC
  Administered 2019-06-14: 12:00:00 500 mg via INTRAVENOUS
  Filled 2019-06-14: qty 50

## 2019-06-14 NOTE — Assessment & Plan Note (Addendum)
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 Stage I E.   #  Proceed with rituximab prednisone today. Labs today reviewed;  acceptable for treatment today.   #If patient is tolerating treatments well/then would recommend ramping up the treatment to include mini-CHOP with rituximab.  #Alzheimer's dementia/lives alone-monitor carefully on treatments.  # I spoke at length with the patient's family/son, [name]- regarding the patient's clinical status/plan of care.  Family agreement.   # DISPOSITION: # Rituxan today # Follow up [place] in 1 week-labs # Follow up in 3 weeks- MD; labs-cbc/cmp/LDH-mini Rituxan-CHOP; D-2- fulphila- Dr.B

## 2019-06-14 NOTE — Progress Notes (Signed)
[facility] CONSULT NOTE  Patient Care Team: Derinda Late, MD as PCP - General (Family Medicine)  CHIEF COMPLAINTS/PURPOSE OF CONSULTATION: Breast lymphoma   Oncology History Overview Note  # AUG 2020- LEFT BREAST DLBCL [non-GCB subtype; Double expressor;NEG- CD-30; Ki-67-80-90%]; FISH -negative for translocations; SEP 2020- PET-left breast intense uptake; no distant disease noted.  Hold off bone marrow biopsy [patient/family preference not to be aggressive]; Stage adjusted IPI-"2 points" cure rate 50-60%.   # 9/21- Pred+Rituxan;  -------------------------------------------------------------------------------------------- # 2006- DLBCL of Breast [s? 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    ;GOALS: Cure  CURRENT/MOST RECENT THERAPY:Rituxan-+Pred    Lymphoma of breast ([place])  05/25/2019 Initial Diagnosis   Lymphoma of breast ([place])   06/14/2019 -  Chemotherapy   The patient had DOXOrubicin (ADRIAMYCIN) chemo injection 36 mg, 25 mg/m2 = 36 mg (original dose ), Intravenous,  Once, 0 of 3 cycles Dose modification: 25 mg/m2 (Cycle 3, Reason: Provider Judgment) palonosetron (ALOXI) injection 0.25 mg, 0.25 mg, Intravenous,  Once, 0 of 3 cycles pegfilgrastim-jmdb (FULPHILA) injection 6 mg, 6 mg, Subcutaneous,  Once, 0 of 3 cycles vinCRIStine (ONCOVIN) 2 mg in sodium chloride 0.9 % 50 mL chemo infusion, 2 mg, Intravenous,  Once, 0 of 3 cycles riTUXimab (RITUXAN) 500 mg in sodium chloride 0.9 % 250 mL (1.6667 mg/mL) infusion, 375 mg/m2 = 500 mg, Intravenous,  Once, 0 of 4 cycles cyclophosphamide (CYTOXAN) 580 mg in sodium chloride 0.9 % 250 mL chemo infusion, 400 mg/m2 = 580 mg (original dose ), Intravenous,  Once, 0 of 3 cycles Dose modification: 600 mg/m2 (Cycle 3, Reason: Provider Judgment), 400 mg/m2 (Cycle 3, Reason: Provider Judgment) fosaprepitant (EMEND) 150 mg, dexamethasone (DECADRON) 12 mg in  sodium chloride 0.9 % 145 mL IVPB, , Intravenous,  Once, 0 of 3 cycles  for chemotherapy treatment.       HISTORY OF PRESENTING ILLNESS:  Stephanie Davidson 85 y.o.  female diffuse large B-cell lymphoma of the left breast is here to proceed with treatment-with Rituxan alone/patient preference.  Patient denies any worsening swelling of the left breast mass.  No weight loss.  No nausea no vomiting pain no fevers or chills.  No headaches.   Review of Systems  Constitutional: Negative for chills, diaphoresis, fever and malaise/fatigue.  HENT: Negative for nosebleeds and sore throat.   Eyes: Negative for double vision.  Respiratory: Negative for cough, hemoptysis, sputum production, shortness of breath and wheezing.   Cardiovascular: Negative for chest pain, palpitations, orthopnea and leg swelling.  Gastrointestinal: Negative for abdominal pain, blood in stool, constipation, diarrhea, heartburn, melena, nausea and vomiting.  Genitourinary: Negative for dysuria, frequency and urgency.  Musculoskeletal: Positive for back pain and joint pain.  Skin: Negative.  Negative for itching and rash.  Neurological: Negative for dizziness, tingling, focal weakness, weakness and headaches.  Endo/Heme/Allergies: Does not bruise/bleed easily.  Psychiatric/Behavioral: Positive for memory loss. Negative for depression. The patient is not nervous/anxious and does not have insomnia.      MEDICAL HISTORY:  Past Medical History:  Diagnosis Date  . Breast cancer ([place])   . Hypertension   . Personal history of chemotherapy   . Personal history of radiation therapy     SURGICAL HISTORY: Past Surgical History:  Procedure Laterality Date  . BREAST BIOPSY Right ?   needle bx-benign  . BREAST BIOPSY Left 05/12/2019   [place] bx 12:00, venus marker, path  pending  . BREAST EXCISIONAL BIOPSY Left 2003   lymphoma  . BREAST EXCISIONAL BIOPSY Left ?   lump was removed, benign  . IR IMAGING GUIDED PORT INSERTION   06/02/2019    SOCIAL HISTORY: Social History   Socioeconomic History  . Marital status: Single    Spouse name: Not on file  . Number of children: 2  . Years of education: Not on file  . Highest education level: Not on file  Occupational History  . Not on file  Social Needs  . Financial resource strain: Not hard at all  . Food insecurity    Worry: Never true    Inability: Never true  . Transportation needs    Medical: No    Non-medical: No  Tobacco Use  . Smoking status: Never Smoker  . Smokeless tobacco: Never Used  Substance and Sexual Activity  . Alcohol use: No  . Drug use: Not on file  . Sexual activity: Not on file  Lifestyle  . Physical activity    Days per week: 7 days    Minutes per session: 30 min  . Stress: Only a little  Relationships  . Social connections    Talks on phone: More than three times a week    Gets together: More than three times a week    Attends religious service: Not on file    Active member of club or organization: No    Attends meetings of clubs or organizations: Never    Relationship status: Not on file  . Intimate partner violence    Fear of current or ex partner: No    Emotionally abused: No    Physically abused: No    Forced sexual activity: No  Other Topics Concern  . Not on file  Social History Narrative   [place]; own apartment; no smoking; no alcohol. 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     FAMILY HISTORY: Family History  Problem Relation Age of Onset  . Breast cancer Neg Hx     ALLERGIES:  has No Known Allergies.  MEDICATIONS:  Current Outpatient Medications  Medication Sig Dispense Refill  . amLODipine (NORVASC) 10 MG tablet Take 1 tablet by mouth daily.    . Calcium Carbonate-Vitamin D (CALTRATE 600+D PO) Take 1 tablet by mouth 2 (two) times daily.    Marland Kitchen docusate calcium (SURFAK) 240 MG capsule Take 1 capsule by mouth 2 (two) times daily.    Marland Kitchen donepezil (ARICEPT) 10 MG tablet Take 1 tablet by mouth at bedtime.    . fluticasone  (FLONASE) 50 MCG/ACT nasal spray Place 2 sprays into both nostrils daily.    . hydrochlorothiazide (HYDRODIURIL) 25 MG tablet Take 1 tablet by mouth daily.    . irbesartan (AVAPRO) 300 MG tablet Take 1 tablet by mouth daily.    Marland Kitchen lidocaine-prilocaine (EMLA) cream Apply to affected area once 30 g 3  . Multiple Vitamin (MULTI-VITAMIN) tablet Take 1 tablet by mouth daily.    . Omega-3 Fatty Acids (FISH OIL) 1000 MG CAPS Take 1 tablet by mouth daily.    . ondansetron (ZOFRAN) 8 MG tablet Take 1 tablet (8 mg total) by mouth 2 (two) times daily as needed for refractory nausea / vomiting. 30 tablet 1  . predniSONE (DELTASONE) 20 MG tablet Take 3 tablets (60 mg total) by mouth daily with breakfast. Take on days 1-5 of chemotherapy. 60 tablet 1  . traZODone (DESYREL) 50 MG tablet Take 1 tablet by mouth 2 (two) times daily at 8  am and 10 pm.    . meloxicam (MOBIC) 15 MG tablet Take 1 tablet by mouth as needed.    Marland Kitchen omeprazole (PRILOSEC) 20 MG capsule Take 1 capsule by mouth daily.    . prochlorperazine (COMPAZINE) 10 MG tablet Take 1 tablet (10 mg total) by mouth every 6 (six) hours as needed (Nausea or vomiting). (Patient not taking: Reported on 06/13/2019) 30 tablet 6   No current facility-administered medications for this visit.       Marland Kitchen  PHYSICAL EXAMINATION: ECOG PERFORMANCE STATUS: 1 - Symptomatic but completely ambulatory  Vitals:   06/13/19 1501  BP: 140/65  Pulse: 82  Resp: 16  Temp: (!) 97 F (36.1 C)   Filed Weights   06/13/19 1501  Weight: 105 lb (47.6 kg)    Physical Exam  Constitutional: She is oriented to person, place, and time and well-developed, well-nourished, and in no distress.  HENT:  Head: Normocephalic and atraumatic.  Mouth/Throat: Oropharynx is clear and moist. No oropharyngeal exudate.  Eyes: Pupils are equal, round, and reactive to light.  Neck: Normal range of motion. Neck supple.  Cardiovascular: Normal rate and regular rhythm.  Pulmonary/Chest: Effort  normal and breath sounds normal. No respiratory distress. She has no wheezes.  Abdominal: Soft. Bowel sounds are normal. She exhibits no distension and no mass. There is no abdominal tenderness. There is no rebound and no guarding.  Musculoskeletal: Normal range of motion.        General: No tenderness or edema.  Neurological: She is alert and oriented to person, place, and time.  Skin: Skin is warm.  Left breast upper inner quadrant-5.5 x 4.5 cm mobile mass noted.  Psychiatric: Affect normal.     LABORATORY DATA:  I have reviewed the data as listed Lab Results  Component Value Date   WBC 6.3 06/14/2019   HGB 12.7 06/14/2019   HCT 38.1 06/14/2019   MCV 90.1 06/14/2019   PLT 160 06/14/2019   Recent Labs    05/26/19 1237 06/14/19 0854  NA 136 134*  K 3.9 4.0  CL 99 100  CO2 29 25  GLUCOSE 108* 110*  BUN 25* 18  CREATININE 0.82 0.86  CALCIUM 10.3 9.5  GFRNONAA >60 >60  GFRAA >60 >60  PROT 8.5* 7.4  ALBUMIN 4.9 4.3  AST 29 24  ALT 21 19  ALKPHOS 118 102  BILITOT 0.6 0.5    RADIOGRAPHIC STUDIES: I have personally reviewed the radiological images as listed and agreed with the findings in the report. Nm Pet Image Initial (pi) Skull Base To Thigh  Result Date: 06/03/2019 CLINICAL DATA:  Initial treatment strategy for lymphoma of left breast. Recent biopsy. History of left breast cancer in 2006. Chemotherapy and radiation therapy 16 years ago. EXAM: NUCLEAR MEDICINE PET SKULL BASE TO THIGH TECHNIQUE: 5.8 mCi F-18 FDG was injected intravenously. Full-ring PET imaging was performed from the skull base to thigh after the radiotracer. CT data was obtained and used for attenuation correction and anatomic localization. Fasting blood glucose: 99 mg/dl COMPARISON:  11/03/2012 PET FINDINGS: Mediastinal blood pool activity: SUV max 2.3 Liver activity: SUV max 3.1 NECK: Left palatine tonsil hypermetabolism is without CT correlate and favored to be physiologic. Low-level hypermetabolism  corresponding to jugular chain nodes, likely reactive. Example left-sided node at 6 mm and a S.U.V. max of 2.9 on image 37/3. Incidental CT findings: Bilateral carotid atherosclerosis. No cervical adenopathy. CHEST: Medial left breast primary measures 3.6 x 2.5 cm and a S.U.V. max of  13.7 on image 103/3. No thoracic nodal or pulmonary parenchymal hypermetabolism identified. Incidental CT findings: Recently placed Port-A-Cath, given air along the course of the catheter. Terminates at the superior caval/atrial junction. Mild cardiomegaly, accentuated by a pectus excavatum deformity. Aortic and coronary artery atherosclerosis. No axillary adenopathy. 1.7 cm right-sided thyroid nodule on 57/3 is similar in size back in 2014. Left upper lobe calcified granuloma. Mild anterior left upper lobe radiation induced fibrosis. ABDOMEN/PELVIS: No abdominopelvic parenchymal or nodal hypermetabolism. Incidental CT findings: Normal adrenal glands. Abdominal aortic atherosclerosis. Interpolar left renal 3.2 cm low-density lesion is most consistent with a cyst. High right hepatic lobe subcentimeter low-density lesion is also likely a cyst. Hysterectomy. SKELETON: No abnormal marrow activity. Incidental CT findings: Right proximal femur fixation. Right hip osteoarthritis. IMPRESSION: 1. Medial left breast mass, consistent with the clinical history of lymphoma. No evidence of extrathoracic hypermetabolic lymphoma. 2. Coronary artery atherosclerosis. Aortic Atherosclerosis (ICD10-I70.0). Electronically Signed   By: Abigail Miyamoto M.D.   On: 06/03/2019 14:15   Ir Imaging Guided Port Insertion  Result Date: 06/03/2019 INDICATION: 85 year old female referred for port catheter EXAM: IMPLANTED PORT A CATH PLACEMENT WITH ULTRASOUND AND FLUOROSCOPIC GUIDANCE MEDICATIONS: 2 g Ancef; The antibiotic was administered within an appropriate time interval prior to skin puncture. ANESTHESIA/SEDATION: Moderate (conscious) sedation was employed during  this procedure. A total of Versed 2.0 mg and Fentanyl 100 mcg was administered intravenously. Moderate Sedation Time: 19 minutes. The patient's level of consciousness and vital signs were monitored continuously by radiology nursing throughout the procedure under my direct supervision. FLUOROSCOPY TIME:  0 minutes, 6 seconds (5.62 mGy) COMPLICATIONS: None PROCEDURE: The procedure, risks, benefits, and alternatives were explained to the patient. Questions regarding the procedure were encouraged and answered. The patient understands and consents to the procedure. Ultrasound survey was performed with images stored and sent to PACs. The right neck and chest was prepped with chlorhexidine, and draped in the usual sterile fashion using maximum barrier technique (cap and mask, sterile gown, sterile gloves, large sterile sheet, hand hygiene and cutaneous antiseptic). Antibiotic prophylaxis was provided with 2.0g Ancef administered IV one hour prior to skin incision. Local anesthesia was attained by infiltration with 1% lidocaine without epinephrine. Ultrasound demonstrated patency of the right internal jugular vein, and this was documented with an image. Under real-time ultrasound guidance, this vein was accessed with a 21 gauge micropuncture needle and image documentation was performed. A small dermatotomy was made at the access site with an 11 scalpel. A 0.018" wire was advanced into the SVC and used to estimate the length of the internal catheter. The access needle exchanged for a 103F micropuncture vascular sheath. The 0.018" wire was then removed and a 0.035" wire advanced into the IVC. An appropriate location for the subcutaneous reservoir was selected below the clavicle and an incision was made through the skin and underlying soft tissues. The subcutaneous tissues were then dissected using a combination of blunt and sharp surgical technique and a pocket was formed. A single lumen power injectable portacatheter was then  tunneled through the subcutaneous tissues from the pocket to the dermatotomy and the port reservoir placed within the subcutaneous pocket. The venous access site was then serially dilated and a peel away vascular sheath placed over the wire. The wire was removed and the port catheter advanced into position under fluoroscopic guidance. The catheter tip is positioned in the cavoatrial junction. This was documented with a spot image. The portacatheter was then tested and found to flush and aspirate  well. The port was flushed with saline followed by 100 units/mL heparinized saline. The pocket was then closed in two layers using first subdermal inverted interrupted absorbable sutures followed by a running subcuticular suture. The epidermis was then sealed with Dermabond. The dermatotomy at the venous access site was also seal with Dermabond. Patient tolerated the procedure well and remained hemodynamically stable throughout. No complications encountered and no significant blood loss encountered IMPRESSION: Status post right IJ port catheter placement. Catheter ready for use. Signed, Dulcy Fanny. Dellia Nims, RPVI [facility] Radiology Electronically Signed   By: Corrie Mckusick D.O.   On: 06/03/2019 08:10    ASSESSMENT & PLAN:   Lymphoma of breast ([place]) #Diffuse large B-cell lymphoma left breast Non-GCB subtype; double expresser. Stage I E.   #  Proceed with rituximab prednisone today. Labs today reviewed;  acceptable for treatment today.   #If patient is tolerating treatments well/then would recommend ramping up the treatment to include mini-CHOP with rituximab.  #Alzheimer's dementia/lives alone-monitor carefully on treatments.  # I spoke at length with the patient's family/son, [name]- regarding the patient's clinical status/plan of care.  Family agreement.   # DISPOSITION: # Rituxan today # Follow up [place] in 1 week-labs # Follow up in 3 weeks- MD;  labs-cbc/cmp/LDH-mini Rituxan-CHOP; D-2- fulphila- Dr.B    All questions were answered. The patient knows to call the clinic with any problems, questions or concerns.    Cammie Sickle, MD 06/14/2019 9:58 AM

## 2019-06-14 NOTE — Progress Notes (Signed)
Pt tolerated infusion well. Pt and VS stable at discharge.  

## 2019-06-14 NOTE — Progress Notes (Signed)
[place]  [phone] [phone]   Name: Stephanie Davidson Date: 06/14/2019 MRN: [id number]  DOB: 25-Mar-1931  Patient Care Team: Derinda Late, MD as PCP - General (Family Medicine)    REASON FOR CONSULTATION: Palliative Care consult requested for this 86 y.o. 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  Patient had severe symptoms from previous treatment and was initially reluctant to pursue further cancer treatment.  Patient was referred to palliative care to help address goals and provide with support.   SOCIAL HISTORY:     reports that she has never smoked. She has never used smokeless tobacco. She reports that she does not drink alcohol.   Patient is widowed.  She lives at [facility] in independent living.  She has a son and [place] and a daughter in [place].  ADVANCE DIRECTIVES:  Not on file.  Her son, Stephanie Davidson, is her healthcare power of attorney and her daughter, Stephanie Davidson, is her financial POA.  Patient reportedly has a living will.  CODE STATUS:   PAST MEDICAL HISTORY: Past Medical History:  Diagnosis Date   Breast cancer ([place])    Hypertension    Personal history of chemotherapy    Personal history of radiation therapy     PAST SURGICAL HISTORY:  Past Surgical History:  Procedure Laterality Date   BREAST BIOPSY Right ?   needle bx-benign   BREAST BIOPSY Left 05/12/2019   [place] bx 12:00, venus marker, path pending   BREAST EXCISIONAL BIOPSY Left 2003   lymphoma   BREAST EXCISIONAL BIOPSY Left ?   lump was removed, benign   IR IMAGING GUIDED PORT INSERTION  06/02/2019    HEMATOLOGY/ONCOLOGY HISTORY:  Oncology History Overview Note  # AUG 2020- LEFT BREAST DLBCL [non-GCB subtype; Double expressor;NEG- CD-30; Ki-67-80-90%]; FISH -negative for translocations; SEP  2020- PET-left breast intense uptake; no distant disease noted.  Hold off bone marrow biopsy [patient/family preference not to be aggressive]; Stage adjusted IPI-"2 points" cure rate 50-60%.   # 9/21- Pred+Rituxan;  -------------------------------------------------------------------------------------------- # 2006- DLBCL of Breast [s? 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    ;GOALS: Cure  CURRENT/MOST RECENT THERAPY:Rituxan-+Pred    Lymphoma of breast ([place])  05/25/2019 Initial Diagnosis   Lymphoma of breast ([place])   06/14/2019 -  Chemotherapy   The patient had DOXOrubicin (ADRIAMYCIN) chemo injection 36 mg, 25 mg/m2 = 36 mg (original dose ), Intravenous,  Once, 0 of 3 cycles Dose modification: 25 mg/m2 (Cycle 3, Reason: Provider Judgment) palonosetron (ALOXI) injection 0.25 mg, 0.25 mg, Intravenous,  Once, 0 of 3 cycles pegfilgrastim-jmdb (FULPHILA) injection 6 mg, 6 mg, Subcutaneous,  Once, 0 of 3 cycles vinCRIStine (ONCOVIN) 2 mg in sodium chloride 0.9 % 50 mL chemo infusion, 2 mg, Intravenous,  Once, 0 of 3 cycles riTUXimab (RITUXAN) 500 mg in sodium chloride 0.9 % 250 mL (1.6667 mg/mL) infusion, 375 mg/m2 = 500 mg, Intravenous,  Once, 1 of 4 cycles Administration: 500 mg (06/14/2019) cyclophosphamide (CYTOXAN) 580 mg in sodium chloride 0.9 % 250 mL chemo infusion, 400 mg/m2 = 580 mg (original dose ), Intravenous,  Once, 0 of 3 cycles Dose modification: 600 mg/m2 (Cycle 3, Reason: Provider Judgment), 400 mg/m2 (Cycle 3, Reason: Provider Judgment) fosaprepitant (EMEND) 150 mg, dexamethasone (DECADRON) 12 mg in  sodium chloride 0.9 % 145 mL IVPB, , Intravenous,  Once, 0 of 3 cycles  for chemotherapy treatment.      ALLERGIES:  has No Known Allergies.  MEDICATIONS:  Current Outpatient Medications  Medication Sig Dispense Refill   amLODipine (NORVASC) 10 MG tablet Take 1 tablet by mouth daily.     Calcium  Carbonate-Vitamin D (CALTRATE 600+D PO) Take 1 tablet by mouth 2 (two) times daily.     docusate calcium (SURFAK) 240 MG capsule Take 1 capsule by mouth 2 (two) times daily.     donepezil (ARICEPT) 10 MG tablet Take 1 tablet by mouth at bedtime.     fluticasone (FLONASE) 50 MCG/ACT nasal spray Place 2 sprays into both nostrils daily.     hydrochlorothiazide (HYDRODIURIL) 25 MG tablet Take 1 tablet by mouth daily.     irbesartan (AVAPRO) 300 MG tablet Take 1 tablet by mouth daily.     lidocaine-prilocaine (EMLA) cream Apply to affected area once 30 g 3   meloxicam (MOBIC) 15 MG tablet Take 1 tablet by mouth as needed.     Multiple Vitamin (MULTI-VITAMIN) tablet Take 1 tablet by mouth daily.     Omega-3 Fatty Acids (FISH OIL) 1000 MG CAPS Take 1 tablet by mouth daily.     omeprazole (PRILOSEC) 20 MG capsule Take 1 capsule by mouth daily.     ondansetron (ZOFRAN) 8 MG tablet Take 1 tablet (8 mg total) by mouth 2 (two) times daily as needed for refractory nausea / vomiting. 30 tablet 1   predniSONE (DELTASONE) 20 MG tablet Take 3 tablets (60 mg total) by mouth daily with breakfast. Take on days 1-5 of chemotherapy. 60 tablet 1   prochlorperazine (COMPAZINE) 10 MG tablet Take 1 tablet (10 mg total) by mouth every 6 (six) hours as needed (Nausea or vomiting). (Patient not taking: Reported on 06/13/2019) 30 tablet 6   traZODone (DESYREL) 50 MG tablet Take 1 tablet by mouth 2 (two) times daily at 8 am and 10 pm.     No current facility-administered medications for this visit.     VITAL SIGNS: There were no vitals taken for this visit. There were no vitals filed for this visit.  Estimated body mass index is 19.84 kg/m as calculated from the following:   Height as of 06/02/19: '5\' 1"'  (1.549 m).   Weight as of an earlier encounter on 06/14/19: 105 lb (47.6 kg).  LABS: CBC:    Component Value Date/Time   WBC 6.3 06/14/2019 0854   HGB 12.7 06/14/2019 0854   HGB 14.1 10/28/2012 0950    HCT 38.1 06/14/2019 0854   HCT 41.4 10/28/2012 0950   PLT 160 06/14/2019 0854   PLT 200 10/28/2012 0950   MCV 90.1 06/14/2019 0854   MCV 92 10/28/2012 0950   NEUTROABS 5.2 06/14/2019 0854   NEUTROABS 3.8 10/28/2012 0950   LYMPHSABS 0.8 06/14/2019 0854   LYMPHSABS 1.4 10/28/2012 0950   MONOABS 0.1 06/14/2019 0854   MONOABS 0.4 10/28/2012 0950   EOSABS 0.1 06/14/2019 0854   EOSABS 0.2 10/28/2012 0950   BASOSABS 0.0 06/14/2019 0854   BASOSABS 0.0 10/28/2012 0950   Comprehensive Metabolic Panel:    Component Value Date/Time   NA 134 (L) 06/14/2019 0854   NA 139 10/28/2012 0950   K 4.0 06/14/2019 0854   K 3.4 (L) 02/18/2013 1508   CL 100 06/14/2019 0854   CL 98 10/28/2012 0950   CO2 25 06/14/2019 0854   CO2 33 (H) 10/28/2012  0950   BUN 18 06/14/2019 0854   BUN 16 10/28/2012 0950   CREATININE 0.86 06/14/2019 0854   CREATININE 0.74 10/28/2012 0950   GLUCOSE 110 (H) 06/14/2019 0854   GLUCOSE 105 (H) 10/28/2012 0950   CALCIUM 9.5 06/14/2019 0854   CALCIUM 10.0 10/28/2012 0950   AST 24 06/14/2019 0854   AST 26 10/28/2012 0950   ALT 19 06/14/2019 0854   ALT 28 10/28/2012 0950   ALKPHOS 102 06/14/2019 0854   ALKPHOS 142 (H) 10/28/2012 0950   BILITOT 0.5 06/14/2019 0854   BILITOT 0.4 10/28/2012 0950   PROT 7.4 06/14/2019 0854   PROT 8.2 10/28/2012 0950   ALBUMIN 4.3 06/14/2019 0854   ALBUMIN 4.0 10/28/2012 0950    RADIOGRAPHIC STUDIES: Nm Pet Image Initial (pi) Skull Base To Thigh  Result Date: 06/03/2019 CLINICAL DATA:  Initial treatment strategy for lymphoma of left breast. Recent biopsy. History of left breast cancer in 2006. Chemotherapy and radiation therapy 16 years ago. EXAM: NUCLEAR MEDICINE PET SKULL BASE TO THIGH TECHNIQUE: 5.8 mCi F-18 FDG was injected intravenously. Full-ring PET imaging was performed from the skull base to thigh after the radiotracer. CT data was obtained and used for attenuation correction and anatomic localization. Fasting blood glucose: 99  mg/dl COMPARISON:  11/03/2012 PET FINDINGS: Mediastinal blood pool activity: SUV max 2.3 Liver activity: SUV max 3.1 NECK: Left palatine tonsil hypermetabolism is without CT correlate and favored to be physiologic. Low-level hypermetabolism corresponding to jugular chain nodes, likely reactive. Example left-sided node at 6 mm and a S.U.V. max of 2.9 on image 37/3. Incidental CT findings: Bilateral carotid atherosclerosis. No cervical adenopathy. CHEST: Medial left breast primary measures 3.6 x 2.5 cm and a S.U.V. max of 13.7 on image 103/3. No thoracic nodal or pulmonary parenchymal hypermetabolism identified. Incidental CT findings: Recently placed Port-A-Cath, given air along the course of the catheter. Terminates at the superior caval/atrial junction. Mild cardiomegaly, accentuated by a pectus excavatum deformity. Aortic and coronary artery atherosclerosis. No axillary adenopathy. 1.7 cm right-sided thyroid nodule on 57/3 is similar in size back in 2014. Left upper lobe calcified granuloma. Mild anterior left upper lobe radiation induced fibrosis. ABDOMEN/PELVIS: No abdominopelvic parenchymal or nodal hypermetabolism. Incidental CT findings: Normal adrenal glands. Abdominal aortic atherosclerosis. Interpolar left renal 3.2 cm low-density lesion is most consistent with a cyst. High right hepatic lobe subcentimeter low-density lesion is also likely a cyst. Hysterectomy. SKELETON: No abnormal marrow activity. Incidental CT findings: Right proximal femur fixation. Right hip osteoarthritis. IMPRESSION: 1. Medial left breast mass, consistent with the clinical history of lymphoma. No evidence of extrathoracic hypermetabolic lymphoma. 2. Coronary artery atherosclerosis. Aortic Atherosclerosis (ICD10-I70.0). Electronically Signed   By: Abigail Miyamoto M.D.   On: 06/03/2019 14:15   Ir Imaging Guided Port Insertion  Result Date: 06/03/2019 INDICATION: 85 year old female referred for port catheter EXAM: IMPLANTED PORT A  CATH PLACEMENT WITH ULTRASOUND AND FLUOROSCOPIC GUIDANCE MEDICATIONS: 2 g Ancef; The antibiotic was administered within an appropriate time interval prior to skin puncture. ANESTHESIA/SEDATION: Moderate (conscious) sedation was employed during this procedure. A total of Versed 2.0 mg and Fentanyl 100 mcg was administered intravenously. Moderate Sedation Time: 19 minutes. The patient's level of consciousness and vital signs were monitored continuously by radiology nursing throughout the procedure under my direct supervision. FLUOROSCOPY TIME:  0 minutes, 6 seconds (4.09 mGy) COMPLICATIONS: None PROCEDURE: The procedure, risks, benefits, and alternatives were explained to the patient. Questions regarding the procedure were encouraged and answered. The patient understands and consents to  the procedure. Ultrasound survey was performed with images stored and sent to PACs. The right neck and chest was prepped with chlorhexidine, and draped in the usual sterile fashion using maximum barrier technique (cap and mask, sterile gown, sterile gloves, large sterile sheet, hand hygiene and cutaneous antiseptic). Antibiotic prophylaxis was provided with 2.0g Ancef administered IV one hour prior to skin incision. Local anesthesia was attained by infiltration with 1% lidocaine without epinephrine. Ultrasound demonstrated patency of the right internal jugular vein, and this was documented with an image. Under real-time ultrasound guidance, this vein was accessed with a 21 gauge micropuncture needle and image documentation was performed. A small dermatotomy was made at the access site with an 11 scalpel. A 0.018" wire was advanced into the SVC and used to estimate the length of the internal catheter. The access needle exchanged for a 39F micropuncture vascular sheath. The 0.018" wire was then removed and a 0.035" wire advanced into the IVC. An appropriate location for the subcutaneous reservoir was selected below the clavicle and an  incision was made through the skin and underlying soft tissues. The subcutaneous tissues were then dissected using a combination of blunt and sharp surgical technique and a pocket was formed. A single lumen power injectable portacatheter was then tunneled through the subcutaneous tissues from the pocket to the dermatotomy and the port reservoir placed within the subcutaneous pocket. The venous access site was then serially dilated and a peel away vascular sheath placed over the wire. The wire was removed and the port catheter advanced into position under fluoroscopic guidance. The catheter tip is positioned in the cavoatrial junction. This was documented with a spot image. The portacatheter was then tested and found to flush and aspirate well. The port was flushed with saline followed by 100 units/mL heparinized saline. The pocket was then closed in two layers using first subdermal inverted interrupted absorbable sutures followed by a running subcuticular suture. The epidermis was then sealed with Dermabond. The dermatotomy at the venous access site was also seal with Dermabond. Patient tolerated the procedure well and remained hemodynamically stable throughout. No complications encountered and no significant blood loss encountered IMPRESSION: Status post right IJ port catheter placement. Catheter ready for use. Signed, Dulcy Fanny. Dellia Nims, RPVI [facility] Radiology Electronically Signed   By: Corrie Mckusick D.O.   On: 06/03/2019 08:10    PERFORMANCE STATUS (ECOG) : 0 - Asymptomatic  Review of Systems Unless otherwise noted, a complete review of systems is negative.  Physical Exam General: NAD, frail appearing, thin Pulmonary: Unlabored Extremities: no edema, no joint deformities Skin: no rashes Neurological: Weakness but otherwise nonfocal  IMPRESSION: Routine follow-up visit today.  Patient was seen in the infusion area.  Patient seems to be doing  reasonably well.  She denies any acute changes or concerns.  She denies any distressing symptoms.  Patient continues to live alone at [facility].  Family have hired their home health team to assist when needed.  Patient is scheduled for an in-home palliative care consultation on 9/24.  I called and spoke with patient's daughter today.  She also felt the patient was doing well and had no concerns today.  I sent patient home with a MOST Form last visit but son and daughter did not receive it. I will send another form home today.   PLAN: -Continue current scope of treatment -MOST form reviewed -Home-based PC referral -RTC in 2-4 weeks   Patient expressed understanding and was in  agreement with this plan. She also understands that She can call the clinic at any time with any questions, concerns, or complaints.     Time Total: 15 minutes  Visit consisted of counseling and education dealing with the complex and emotionally intense issues of symptom management and palliative care in the setting of serious and potentially life-threatening illness.Greater than 50%  of this time was spent counseling and coordinating care related to the above assessment and plan.  Signed by: Altha Harm, PhD, NP-C [phone] (Work Cell)

## 2019-06-15 ENCOUNTER — Telehealth: Payer: Self-pay

## 2019-06-15 NOTE — Telephone Encounter (Signed)
Telephone call to patient and spoke to [place], patients daughter and she states her mother did great with her first treatment.   No problems and feels great.  Encouraged daughter to call for any questions or concerns.   Daughter thanked me for calling to check on her mom.

## 2019-06-17 ENCOUNTER — Other Ambulatory Visit: Payer: Self-pay

## 2019-06-17 ENCOUNTER — Other Ambulatory Visit: Payer: Medicare Other | Admitting: Adult Health Nurse Practitioner

## 2019-06-17 DIAGNOSIS — Z515 Encounter for palliative care: Secondary | ICD-10-CM

## 2019-06-17 NOTE — Progress Notes (Signed)
[place] Consult Note Telephone: [phone]  Fax: [phone]  PATIENT NAME: Stephanie Davidson DOB: December 10, 1930 MRN: [id number]  PRIMARY CARE PROVIDER:   Derinda Late, MD  REFERRING PROVIDER: Altha Harm NP  RESPONSIBLE PARTY:   Becky Sax, daughter [phone]    RECOMMENDATIONS and PLAN:  1.  Advanced care planning.  Patient currently full code.  Went over MOST form today and have appointment in one month to go over and fill out.  Spoke with daughter on phone and will send Zoom invite to her so she can be a part of the ACP discussion.  Left MOST with patient and advised daughter that she can look up a copy of it on line to go over before the meeting.  She did state that she calls a friend of her mom's at [facility] to help make sure appointments get put on her calendar and then she reminds her that day as well with phone call.    2.  Lymphoma of breast.  Patient has had this in the past about 16 years ago. Diagnosed early this month with the lymphoma  In the left breast.  She underwent immunotherapy earlier this week and states that she is feeling good.  No complaints of pain, nausea, fatigue, constipation.  Daughter states that on 07/05/2019 she will undergo her first round of chemo.  Discussed with patient and daughter that as she goes through treatment she may start having side effects of the chemo and that we will continue to monitor and help with symptom management.   3.  Dementia.  Patient is in early stages and does have some forgetfulness.  She is able to ambulate without assistive devices and takes walks daily for exercise.  She is [place].  A&O x3.  States that she still drives but only if she has to.  She pays her bills but her daughter is able to access her accounts online to make sure they are taken care of.  She is still able to cook and clean for herself.  No changes in appetite and patient's current weight is  105.  Daughter states that they have set up a home health account at [facility] in case she needs it later.  They work with Jackey Loge, SW at [facility].    I spent 50 minutes providing this consultation,  from 9:00 to 9:50. More than 50% of the time in this consultation was spent coordinating communication.   HISTORY OF PRESENT ILLNESS:  Stephanie Davidson is a 85 y.o. year old female with multiple medical problems including lymphoma of breast, early alzheimer's dementia, HTN. Palliative Care was asked to help address goals of care.   CODE STATUS: Full code  PPS: 0% HOSPICE ELIGIBILITY/DIAGNOSIS: TBD  PHYSICAL EXAM:   General: NAD, frail appearing, thin Extremities: no edema, no joint deformities Neurological: Weakness but otherwise nonfocal; does have some forgetfulness related to early Alzheimer's dementia  PAST MEDICAL HISTORY:  Past Medical History:  Diagnosis Date  . Breast cancer ([place])   . Hypertension   . Personal history of chemotherapy   . Personal history of radiation therapy     SOCIAL HX:  Social History   Tobacco Use  . Smoking status: Never Smoker  . Smokeless tobacco: Never Used  Substance Use Topics  . Alcohol use: No    ALLERGIES: No Known Allergies   PERTINENT MEDICATIONS:  Outpatient Encounter Medications as of 06/17/2019  Medication Sig  .  amLODipine (NORVASC) 10 MG tablet Take 1 tablet by mouth daily.  . Calcium Carbonate-Vitamin D (CALTRATE 600+D PO) Take 1 tablet by mouth 2 (two) times daily.  Marland Kitchen docusate calcium (SURFAK) 240 MG capsule Take 1 capsule by mouth 2 (two) times daily.  Marland Kitchen donepezil (ARICEPT) 10 MG tablet Take 1 tablet by mouth at bedtime.  . fluticasone (FLONASE) 50 MCG/ACT nasal spray Place 2 sprays into both nostrils daily.  . hydrochlorothiazide (HYDRODIURIL) 25 MG tablet Take 1 tablet by mouth daily.  . irbesartan (AVAPRO) 300 MG tablet Take 1 tablet by mouth daily.  Marland Kitchen lidocaine-prilocaine (EMLA) cream Apply to affected area once  .  meloxicam (MOBIC) 15 MG tablet Take 1 tablet by mouth as needed.  . Multiple Vitamin (MULTI-VITAMIN) tablet Take 1 tablet by mouth daily.  . Omega-3 Fatty Acids (FISH OIL) 1000 MG CAPS Take 1 tablet by mouth daily.  Marland Kitchen omeprazole (PRILOSEC) 20 MG capsule Take 1 capsule by mouth daily.  . ondansetron (ZOFRAN) 8 MG tablet Take 1 tablet (8 mg total) by mouth 2 (two) times daily as needed for refractory nausea / vomiting.  . predniSONE (DELTASONE) 20 MG tablet Take 3 tablets (60 mg total) by mouth daily with breakfast. Take on days 1-5 of chemotherapy.  . prochlorperazine (COMPAZINE) 10 MG tablet Take 1 tablet (10 mg total) by mouth every 6 (six) hours as needed (Nausea or vomiting). (Patient not taking: Reported on 06/13/2019)  . traZODone (DESYREL) 50 MG tablet Take 1 tablet by mouth 2 (two) times daily at 8 am and 10 pm.   No facility-administered encounter medications on file as of 06/17/2019.       Valarie Farace Jenetta Downer, NP

## 2019-06-21 ENCOUNTER — Other Ambulatory Visit: Payer: PRIVATE HEALTH INSURANCE

## 2019-06-21 ENCOUNTER — Encounter: Payer: Self-pay | Admitting: Oncology

## 2019-06-21 ENCOUNTER — Other Ambulatory Visit: Payer: Self-pay

## 2019-06-21 ENCOUNTER — Encounter: Payer: PRIVATE HEALTH INSURANCE | Admitting: Oncology

## 2019-06-21 NOTE — Progress Notes (Signed)
Patient stated that she had been doing well with no complaints. Patient wanted to know if she could walk a mile or two.

## 2019-06-22 ENCOUNTER — Inpatient Hospital Stay (HOSPITAL_BASED_OUTPATIENT_CLINIC_OR_DEPARTMENT_OTHER): Payer: Medicare Other | Admitting: Oncology

## 2019-06-22 ENCOUNTER — Other Ambulatory Visit: Payer: Self-pay

## 2019-06-22 ENCOUNTER — Inpatient Hospital Stay: Payer: Medicare Other

## 2019-06-22 DIAGNOSIS — C8599 Non-Hodgkin lymphoma, unspecified, extranodal and solid organ sites: Secondary | ICD-10-CM

## 2019-06-22 DIAGNOSIS — Z5111 Encounter for antineoplastic chemotherapy: Secondary | ICD-10-CM | POA: Diagnosis not present

## 2019-06-22 LAB — CBC WITH DIFFERENTIAL/PLATELET
Abs Immature Granulocytes: 0.03 10*3/uL (ref 0.00–0.07)
Basophils Absolute: 0 10*3/uL (ref 0.0–0.1)
Basophils Relative: 0 %
Eosinophils Absolute: 0 10*3/uL (ref 0.0–0.5)
Eosinophils Relative: 0 %
HCT: 38.2 % (ref 36.0–46.0)
Hemoglobin: 12.5 g/dL (ref 12.0–15.0)
Immature Granulocytes: 0 %
Lymphocytes Relative: 7 %
Lymphs Abs: 0.5 10*3/uL — ABNORMAL LOW (ref 0.7–4.0)
MCH: 29.5 pg (ref 26.0–34.0)
MCHC: 32.7 g/dL (ref 30.0–36.0)
MCV: 90.1 fL (ref 80.0–100.0)
Monocytes Absolute: 0.1 10*3/uL (ref 0.1–1.0)
Monocytes Relative: 1 %
Neutro Abs: 7.2 10*3/uL (ref 1.7–7.7)
Neutrophils Relative %: 92 %
Platelets: 291 10*3/uL (ref 150–400)
RBC: 4.24 MIL/uL (ref 3.87–5.11)
RDW: 13.1 % (ref 11.5–15.5)
WBC: 7.8 10*3/uL (ref 4.0–10.5)
nRBC: 0 % (ref 0.0–0.2)

## 2019-06-22 LAB — BASIC METABOLIC PANEL
Anion gap: 8 (ref 5–15)
BUN: 16 mg/dL (ref 8–23)
CO2: 25 mmol/L (ref 22–32)
Calcium: 9.3 mg/dL (ref 8.9–10.3)
Chloride: 100 mmol/L (ref 98–111)
Creatinine, Ser: 0.69 mg/dL (ref 0.44–1.00)
GFR calc Af Amer: >60 mL/min (ref >60)
GFR calc non Af Amer: >60 mL/min (ref >60)
Glucose, Bld: 120 mg/dL — ABNORMAL HIGH (ref 70–99)
Potassium: 4.2 mmol/L (ref 3.5–5.1)
Sodium: 133 mmol/L — ABNORMAL LOW (ref 135–145)

## 2019-06-22 NOTE — Assessment & Plan Note (Addendum)
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 Stage I E.   #Labs reviewed status post first cycle of Rituxan given 1 week ago.  She appears to be tolerating well.  Lab work is stable.  She denies any symptoms or side effects.  #She is scheduled to return to clinic in approximately 2 weeks for lab work, MD assessment and consideration of cycle 2 Rituxan with the addition of mini-CHOP  #Port-A-Cath bruising: Likely due to recent placement and treatment.  Patient concerned of how it is protruding from her chest.  Explained in detail that Port-A-Cath placement looked good and there was no evidence of infection/complications.   #We will continue to monitor her closely given her history of Alzheimer's dementia and she lives at home alone.  Patient does have a Education officer, museum at her facility whom is a good resource should patient or the cancer center need any additional support.  # DISPOSITION: RTC on 07/05/2019 for labs, assessment and consideration of Rituxan and mini CHOP.

## 2019-06-22 NOTE — Progress Notes (Signed)
[facility] CONSULT NOTE  Patient Care Team: Derinda Late, MD as PCP - General (Family Medicine)  CHIEF COMPLAINTS/PURPOSE OF CONSULTATION: Breast lymphoma   Oncology History Overview Note  # AUG 2020- LEFT BREAST DLBCL [non-GCB subtype; Double expressor;NEG- CD-30; Ki-67-80-90%]; FISH -negative for translocations; SEP 2020- PET-left breast intense uptake; no distant disease noted.  Hold off bone marrow biopsy [patient/family preference not to be aggressive]; Stage adjusted IPI-"2 points" cure rate 50-60%.   # 9/21- Pred+Rituxan;  -------------------------------------------------------------------------------------------- # 2006- DLBCL of Breast [s? 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    ;GOALS: Cure  CURRENT/MOST RECENT THERAPY:Rituxan-+Pred    Lymphoma of breast ([place])  05/25/2019 Initial Diagnosis   Lymphoma of breast ([place])   06/14/2019 -  Chemotherapy   The patient had DOXOrubicin (ADRIAMYCIN) chemo injection 36 mg, 25 mg/m2 = 36 mg (original dose ), Intravenous,  Once, 0 of 3 cycles Dose modification: 25 mg/m2 (Cycle 3, Reason: Provider Judgment) palonosetron (ALOXI) injection 0.25 mg, 0.25 mg, Intravenous,  Once, 0 of 3 cycles pegfilgrastim-jmdb (FULPHILA) injection 6 mg, 6 mg, Subcutaneous,  Once, 0 of 3 cycles vinCRIStine (ONCOVIN) 2 mg in sodium chloride 0.9 % 50 mL chemo infusion, 2 mg, Intravenous,  Once, 0 of 3 cycles riTUXimab (RITUXAN) 500 mg in sodium chloride 0.9 % 250 mL (1.6667 mg/mL) infusion, 375 mg/m2 = 500 mg, Intravenous,  Once, 1 of 4 cycles Administration: 500 mg (06/14/2019) cyclophosphamide (CYTOXAN) 580 mg in sodium chloride 0.9 % 250 mL chemo infusion, 400 mg/m2 = 580 mg (original dose ), Intravenous,  Once, 0 of 3 cycles Dose modification: 600 mg/m2 (Cycle 3, Reason: Provider Judgment), 400 mg/m2 (Cycle 3, Reason: Provider Judgment) fosaprepitant (EMEND) 150  mg, dexamethasone (DECADRON) 12 mg in sodium chloride 0.9 % 145 mL IVPB, , Intravenous,  Once, 0 of 3 cycles  for chemotherapy treatment.       HISTORY OF PRESENTING ILLNESS:  Stephanie Davidson 85 y.o.  female diffuse large B-cell lymphoma of the left breast is here to review lab work.  Today, patient denies any new symptoms or side effects since her first treatment with Rituxan last week.  She states "I feel great".  She denies increased swelling of the left breast.  Her appetite has been good.  She denies any nausea or vomiting.  She has no fevers or chills.  No headaches.  She questions how her port looks today.  She is wondering if it is supposed to "stick out".  She is interested in resuming her daily 1 mile walks.  Review of Systems  Constitutional: Negative.  Negative for chills, fever, malaise/fatigue and weight loss.  HENT: Negative for congestion, ear pain and tinnitus.   Eyes: Negative.  Negative for blurred vision and double vision.  Respiratory: Negative.  Negative for cough, sputum production and shortness of breath.   Cardiovascular: Negative.  Negative for chest pain, palpitations and leg swelling.  Gastrointestinal: Negative.  Negative for abdominal pain, constipation, diarrhea, nausea and vomiting.  Genitourinary: Negative for dysuria, frequency and urgency.  Musculoskeletal: Positive for joint pain. Negative for back pain and falls.  Skin: Negative.  Negative for rash.  Neurological: Negative.  Negative for weakness and headaches.  Endo/Heme/Allergies: Negative.  Does not bruise/bleed easily.  Psychiatric/Behavioral: Positive for memory loss. Negative for depression. The patient is not nervous/anxious and does not have insomnia.      MEDICAL HISTORY:  Past  Medical History:  Diagnosis Date  . Breast cancer ([place])   . Hypertension   . Personal history of chemotherapy   . Personal history of radiation therapy     SURGICAL HISTORY: Past Surgical History:  Procedure  Laterality Date  . BREAST BIOPSY Right ?   needle bx-benign  . BREAST BIOPSY Left 05/12/2019   [place] bx 12:00, venus marker, path pending  . BREAST EXCISIONAL BIOPSY Left 2003   lymphoma  . BREAST EXCISIONAL BIOPSY Left ?   lump was removed, benign  . IR IMAGING GUIDED PORT INSERTION  06/02/2019    SOCIAL HISTORY: Social History   Socioeconomic History  . Marital status: Single    Spouse name: Not on file  . Number of children: 2  . Years of education: Not on file  . Highest education level: Not on file  Occupational History  . Not on file  Social Needs  . Financial resource strain: Not hard at all  . Food insecurity    Worry: Never true    Inability: Never true  . Transportation needs    Medical: No    Non-medical: No  Tobacco Use  . Smoking status: Never Smoker  . Smokeless tobacco: Never Used  Substance and Sexual Activity  . Alcohol use: No  . Drug use: Not on file  . Sexual activity: Not on file  Lifestyle  . Physical activity    Days per week: 7 days    Minutes per session: 30 min  . Stress: Only a little  Relationships  . Social connections    Talks on phone: More than three times a week    Gets together: More than three times a week    Attends religious service: Not on file    Active member of club or organization: No    Attends meetings of clubs or organizations: Never    Relationship status: Not on file  . Intimate partner violence    Fear of current or ex partner: No    Emotionally abused: No    Physically abused: No    Forced sexual activity: No  Other Topics Concern  . Not on file  Social History Narrative   [place]; own apartment; no smoking; no alcohol. 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     FAMILY HISTORY: Family History  Problem Relation Age of Onset  . Breast cancer Neg Hx     ALLERGIES:  has No Known Allergies.  MEDICATIONS:  Current Outpatient Medications  Medication Sig Dispense Refill  . amLODipine (NORVASC) 10 MG tablet Take 1 tablet by mouth  daily.    . Calcium Carbonate-Vitamin D (CALTRATE 600+D PO) Take 1 tablet by mouth 2 (two) times daily.    Marland Kitchen docusate calcium (SURFAK) 240 MG capsule Take 1 capsule by mouth 2 (two) times daily.    Marland Kitchen donepezil (ARICEPT) 10 MG tablet Take 1 tablet by mouth at bedtime.    . fluticasone (FLONASE) 50 MCG/ACT nasal spray Place 2 sprays into both nostrils daily.    . hydrochlorothiazide (HYDRODIURIL) 25 MG tablet Take 1 tablet by mouth daily.    . irbesartan (AVAPRO) 300 MG tablet Take 1 tablet by mouth daily.    Marland Kitchen lidocaine-prilocaine (EMLA) cream Apply to affected area once 30 g 3  . meloxicam (MOBIC) 15 MG tablet Take 1 tablet by mouth as needed.    . Multiple Vitamin (MULTI-VITAMIN) tablet Take 1 tablet by mouth daily.    . Omega-3 Fatty Acids (FISH OIL) 1000 MG CAPS  Take 1 tablet by mouth daily.    Marland Kitchen omeprazole (PRILOSEC) 20 MG capsule Take 1 capsule by mouth daily.    . predniSONE (DELTASONE) 20 MG tablet Take 3 tablets (60 mg total) by mouth daily with breakfast. Take on days 1-5 of chemotherapy. 60 tablet 1  . traZODone (DESYREL) 50 MG tablet Take 1 tablet by mouth 2 (two) times daily at 8 am and 10 pm.    . ondansetron (ZOFRAN) 8 MG tablet Take 1 tablet (8 mg total) by mouth 2 (two) times daily as needed for refractory nausea / vomiting. (Patient not taking: Reported on 06/21/2019) 30 tablet 1  . prochlorperazine (COMPAZINE) 10 MG tablet Take 1 tablet (10 mg total) by mouth every 6 (six) hours as needed (Nausea or vomiting). (Patient not taking: Reported on 06/21/2019) 30 tablet 6   No current facility-administered medications for this visit.     Physical Exam Constitutional:      Appearance: Normal appearance.  HENT:     Head: Normocephalic and atraumatic.  Eyes:     Pupils: Pupils are equal, round, and reactive to light.  Neck:     Musculoskeletal: Normal range of motion.  Cardiovascular:     Rate and Rhythm: Normal rate and regular rhythm.     Heart sounds: Normal heart sounds. No  murmur.  Pulmonary:     Effort: Pulmonary effort is normal.     Breath sounds: Normal breath sounds. No wheezing.  Abdominal:     General: Bowel sounds are normal. There is no distension.     Palpations: Abdomen is soft.     Tenderness: There is no abdominal tenderness.  Musculoskeletal: Normal range of motion.  Skin:    General: Skin is warm and dry.     Findings: Bruising (to port-right side of chest) present. No rash.  Neurological:     Mental Status: She is alert and oriented to person, place, and time.  Psychiatric:        Judgment: Judgment normal.    PHYSICAL EXAMINATION: ECOG PERFORMANCE STATUS: 1 - Symptomatic but completely ambulatory  Vitals:   06/22/19 1038  BP: (!) 145/64  Pulse: 83  Resp: 18  Temp: 98.1 F (36.7 C)   Filed Weights   06/22/19 1038  Weight: 108 lb (49 kg)    Physical Exam  Constitutional: She is oriented to person, place, and time and well-developed, well-nourished, and in no distress.  HENT:  Head: Normocephalic and atraumatic.  Eyes: Pupils are equal, round, and reactive to light.  Neck: Normal range of motion.  Cardiovascular: Normal rate, regular rhythm and normal heart sounds.  No murmur heard. Pulmonary/Chest: Effort normal and breath sounds normal. She has no wheezes.  Abdominal: Soft. Normal appearance and bowel sounds are normal. She exhibits no distension. There is no abdominal tenderness.  Musculoskeletal: Normal range of motion.  Neurological: She is alert and oriented to person, place, and time.  Skin: Skin is warm and dry. Bruising (to port-right side of chest) noted. No rash noted.  Psychiatric: Judgment normal.    LABORATORY DATA:  I have reviewed the data as listed Lab Results  Component Value Date   WBC 7.8 06/22/2019   HGB 12.5 06/22/2019   HCT 38.2 06/22/2019   MCV 90.1 06/22/2019   PLT 291 06/22/2019   Recent Labs    05/26/19 1237 06/14/19 0854 06/22/19 1015  NA 136 134* 133*  K 3.9 4.0 4.2  CL 99 100  100  CO2 29 25 25  GLUCOSE 108* 110* 120*  BUN 25* 18 16  CREATININE 0.82 0.86 0.69  CALCIUM 10.3 9.5 9.3  GFRNONAA >60 >60 >60  GFRAA >60 >60 >60  PROT 8.5* 7.4  --   ALBUMIN 4.9 4.3  --   AST 29 24  --   ALT 21 19  --   ALKPHOS 118 102  --   BILITOT 0.6 0.5  --     RADIOGRAPHIC STUDIES: I have personally reviewed the radiological images as listed and agreed with the findings in the report. Nm Pet Image Initial (pi) Skull Base To Thigh  Result Date: 06/03/2019 CLINICAL DATA:  Initial treatment strategy for lymphoma of left breast. Recent biopsy. History of left breast cancer in 2006. Chemotherapy and radiation therapy 16 years ago. EXAM: NUCLEAR MEDICINE PET SKULL BASE TO THIGH TECHNIQUE: 5.8 mCi F-18 FDG was injected intravenously. Full-ring PET imaging was performed from the skull base to thigh after the radiotracer. CT data was obtained and used for attenuation correction and anatomic localization. Fasting blood glucose: 99 mg/dl COMPARISON:  11/03/2012 PET FINDINGS: Mediastinal blood pool activity: SUV max 2.3 Liver activity: SUV max 3.1 NECK: Left palatine tonsil hypermetabolism is without CT correlate and favored to be physiologic. Low-level hypermetabolism corresponding to jugular chain nodes, likely reactive. Example left-sided node at 6 mm and a S.U.V. max of 2.9 on image 37/3. Incidental CT findings: Bilateral carotid atherosclerosis. No cervical adenopathy. CHEST: Medial left breast primary measures 3.6 x 2.5 cm and a S.U.V. max of 13.7 on image 103/3. No thoracic nodal or pulmonary parenchymal hypermetabolism identified. Incidental CT findings: Recently placed Port-A-Cath, given air along the course of the catheter. Terminates at the superior caval/atrial junction. Mild cardiomegaly, accentuated by a pectus excavatum deformity. Aortic and coronary artery atherosclerosis. No axillary adenopathy. 1.7 cm right-sided thyroid nodule on 57/3 is similar in size back in 2014. Left upper lobe  calcified granuloma. Mild anterior left upper lobe radiation induced fibrosis. ABDOMEN/PELVIS: No abdominopelvic parenchymal or nodal hypermetabolism. Incidental CT findings: Normal adrenal glands. Abdominal aortic atherosclerosis. Interpolar left renal 3.2 cm low-density lesion is most consistent with a cyst. High right hepatic lobe subcentimeter low-density lesion is also likely a cyst. Hysterectomy. SKELETON: No abnormal marrow activity. Incidental CT findings: Right proximal femur fixation. Right hip osteoarthritis. IMPRESSION: 1. Medial left breast mass, consistent with the clinical history of lymphoma. No evidence of extrathoracic hypermetabolic lymphoma. 2. Coronary artery atherosclerosis. Aortic Atherosclerosis (ICD10-I70.0). Electronically Signed   By: Abigail Miyamoto M.D.   On: 06/03/2019 14:15   Ir Imaging Guided Port Insertion  Result Date: 06/03/2019 INDICATION: 85 year old female referred for port catheter EXAM: IMPLANTED PORT A CATH PLACEMENT WITH ULTRASOUND AND FLUOROSCOPIC GUIDANCE MEDICATIONS: 2 g Ancef; The antibiotic was administered within an appropriate time interval prior to skin puncture. ANESTHESIA/SEDATION: Moderate (conscious) sedation was employed during this procedure. A total of Versed 2.0 mg and Fentanyl 100 mcg was administered intravenously. Moderate Sedation Time: 19 minutes. The patient's level of consciousness and vital signs were monitored continuously by radiology nursing throughout the procedure under my direct supervision. FLUOROSCOPY TIME:  0 minutes, 6 seconds (4.56 mGy) COMPLICATIONS: None PROCEDURE: The procedure, risks, benefits, and alternatives were explained to the patient. Questions regarding the procedure were encouraged and answered. The patient understands and consents to the procedure. Ultrasound survey was performed with images stored and sent to PACs. The right neck and chest was prepped with chlorhexidine, and draped in the usual sterile fashion using maximum  barrier technique (cap and mask,  sterile gown, sterile gloves, large sterile sheet, hand hygiene and cutaneous antiseptic). Antibiotic prophylaxis was provided with 2.0g Ancef administered IV one hour prior to skin incision. Local anesthesia was attained by infiltration with 1% lidocaine without epinephrine. Ultrasound demonstrated patency of the right internal jugular vein, and this was documented with an image. Under real-time ultrasound guidance, this vein was accessed with a 21 gauge micropuncture needle and image documentation was performed. A small dermatotomy was made at the access site with an 11 scalpel. A 0.018" wire was advanced into the SVC and used to estimate the length of the internal catheter. The access needle exchanged for a 57F micropuncture vascular sheath. The 0.018" wire was then removed and a 0.035" wire advanced into the IVC. An appropriate location for the subcutaneous reservoir was selected below the clavicle and an incision was made through the skin and underlying soft tissues. The subcutaneous tissues were then dissected using a combination of blunt and sharp surgical technique and a pocket was formed. A single lumen power injectable portacatheter was then tunneled through the subcutaneous tissues from the pocket to the dermatotomy and the port reservoir placed within the subcutaneous pocket. The venous access site was then serially dilated and a peel away vascular sheath placed over the wire. The wire was removed and the port catheter advanced into position under fluoroscopic guidance. The catheter tip is positioned in the cavoatrial junction. This was documented with a spot image. The portacatheter was then tested and found to flush and aspirate well. The port was flushed with saline followed by 100 units/mL heparinized saline. The pocket was then closed in two layers using first subdermal inverted interrupted absorbable sutures followed by a running subcuticular suture. The epidermis  was then sealed with Dermabond. The dermatotomy at the venous access site was also seal with Dermabond. Patient tolerated the procedure well and remained hemodynamically stable throughout. No complications encountered and no significant blood loss encountered IMPRESSION: Status post right IJ port catheter placement. Catheter ready for use. Signed, Dulcy Fanny. Dellia Nims, RPVI [facility] Electronically Signed   By: Corrie Mckusick D.O.   On: 06/03/2019 08:10    ASSESSMENT & PLAN:   Lymphoma of breast ([place]) #Diffuse large B-cell lymphoma left breast Non-GCB subtype; double expresser. Stage I E.   #Labs reviewed status post first cycle of Rituxan given 1 week ago.  She appears to be tolerating well.  Lab work is stable.  She denies any symptoms or side effects.  #She is scheduled to return to clinic in approximately 2 weeks for lab work, MD assessment and consideration of cycle 2 Rituxan with the addition of mini-CHOP  #Port-A-Cath bruising: Likely due to recent placement and treatment.  Patient concerned of how it is protruding from her chest.  Explained in detail that Port-A-Cath placement looked good and there was no evidence of infection/complications.   #We will continue to monitor her closely given her history of Alzheimer's dementia and she lives at home alone.  Patient does have a Education officer, museum at her facility whom is a good resource should patient or the cancer center need any additional support.  # DISPOSITION: RTC on 07/05/2019 for labs, assessment and consideration of Rituxan and mini CHOP.     All questions were answered. The patient knows to call the clinic with any problems, questions or concerns.    Jacquelin Hawking, NP 06/22/2019 2:33 PM

## 2019-07-02 ENCOUNTER — Other Ambulatory Visit: Payer: Self-pay

## 2019-07-02 NOTE — Progress Notes (Signed)
Patient pre screened for office appointment, no questions or concerns today. 

## 2019-07-05 ENCOUNTER — Other Ambulatory Visit: Payer: Self-pay

## 2019-07-05 ENCOUNTER — Inpatient Hospital Stay (HOSPITAL_BASED_OUTPATIENT_CLINIC_OR_DEPARTMENT_OTHER): Payer: Medicare Other | Admitting: Hospice and Palliative Medicine

## 2019-07-05 ENCOUNTER — Inpatient Hospital Stay: Payer: Medicare Other | Attending: Internal Medicine | Admitting: Internal Medicine

## 2019-07-05 ENCOUNTER — Inpatient Hospital Stay: Payer: Medicare Other

## 2019-07-05 ENCOUNTER — Inpatient Hospital Stay: Payer: Medicare Other | Admitting: *Deleted

## 2019-07-05 VITALS — BP 110/64 | HR 73 | Resp 18

## 2019-07-05 DIAGNOSIS — Z5111 Encounter for antineoplastic chemotherapy: Secondary | ICD-10-CM | POA: Insufficient documentation

## 2019-07-05 DIAGNOSIS — C8599 Non-Hodgkin lymphoma, unspecified, extranodal and solid organ sites: Secondary | ICD-10-CM

## 2019-07-05 DIAGNOSIS — Z515 Encounter for palliative care: Secondary | ICD-10-CM

## 2019-07-05 DIAGNOSIS — F0390 Unspecified dementia without behavioral disturbance: Secondary | ICD-10-CM | POA: Diagnosis not present

## 2019-07-05 DIAGNOSIS — M549 Dorsalgia, unspecified: Secondary | ICD-10-CM | POA: Insufficient documentation

## 2019-07-05 DIAGNOSIS — Z95828 Presence of other vascular implants and grafts: Secondary | ICD-10-CM

## 2019-07-05 DIAGNOSIS — E871 Hypo-osmolality and hyponatremia: Secondary | ICD-10-CM | POA: Insufficient documentation

## 2019-07-05 DIAGNOSIS — G4700 Insomnia, unspecified: Secondary | ICD-10-CM | POA: Diagnosis not present

## 2019-07-05 DIAGNOSIS — C8330 Diffuse large B-cell lymphoma, unspecified site: Secondary | ICD-10-CM | POA: Diagnosis present

## 2019-07-05 DIAGNOSIS — Z79899 Other long term (current) drug therapy: Secondary | ICD-10-CM | POA: Insufficient documentation

## 2019-07-05 LAB — CBC WITH DIFFERENTIAL/PLATELET
Abs Immature Granulocytes: 0.02 10*3/uL (ref 0.00–0.07)
Basophils Absolute: 0 10*3/uL (ref 0.0–0.1)
Basophils Relative: 1 %
Eosinophils Absolute: 0.3 10*3/uL (ref 0.0–0.5)
Eosinophils Relative: 6 %
HCT: 36.3 % (ref 36.0–46.0)
Hemoglobin: 11.9 g/dL — ABNORMAL LOW (ref 12.0–15.0)
Immature Granulocytes: 0 %
Lymphocytes Relative: 22 %
Lymphs Abs: 1.2 10*3/uL (ref 0.7–4.0)
MCH: 29.6 pg (ref 26.0–34.0)
MCHC: 32.8 g/dL (ref 30.0–36.0)
MCV: 90.3 fL (ref 80.0–100.0)
Monocytes Absolute: 0.5 10*3/uL (ref 0.1–1.0)
Monocytes Relative: 9 %
Neutro Abs: 3.4 10*3/uL (ref 1.7–7.7)
Neutrophils Relative %: 62 %
Platelets: 182 10*3/uL (ref 150–400)
RBC: 4.02 MIL/uL (ref 3.87–5.11)
RDW: 13 % (ref 11.5–15.5)
WBC: 5.5 10*3/uL (ref 4.0–10.5)
nRBC: 0 % (ref 0.0–0.2)

## 2019-07-05 LAB — COMPREHENSIVE METABOLIC PANEL
ALT: 15 U/L (ref 0–44)
AST: 21 U/L (ref 15–41)
Albumin: 4.1 g/dL (ref 3.5–5.0)
Alkaline Phosphatase: 95 U/L (ref 38–126)
Anion gap: 8 (ref 5–15)
BUN: 17 mg/dL (ref 8–23)
CO2: 26 mmol/L (ref 22–32)
Calcium: 9.5 mg/dL (ref 8.9–10.3)
Chloride: 98 mmol/L (ref 98–111)
Creatinine, Ser: 0.77 mg/dL (ref 0.44–1.00)
GFR calc Af Amer: >60 mL/min (ref >60)
GFR calc non Af Amer: >60 mL/min (ref >60)
Glucose, Bld: 116 mg/dL — ABNORMAL HIGH (ref 70–99)
Potassium: 3.8 mmol/L (ref 3.5–5.1)
Sodium: 132 mmol/L — ABNORMAL LOW (ref 135–145)
Total Bilirubin: 0.7 mg/dL (ref 0.3–1.2)
Total Protein: 7 g/dL (ref 6.5–8.1)

## 2019-07-05 LAB — LACTATE DEHYDROGENASE: LDH: 225 U/L — ABNORMAL HIGH (ref 98–192)

## 2019-07-05 MED ORDER — SODIUM CHLORIDE 0.9 % IV SOLN
375.0000 mg/m2 | Freq: Once | INTRAVENOUS | Status: AC
  Administered 2019-07-05: 12:00:00 500 mg via INTRAVENOUS
  Filled 2019-07-05: qty 50

## 2019-07-05 MED ORDER — SODIUM CHLORIDE 0.9 % IV SOLN: 375.0000 mg/m2 | Freq: Once | INTRAVENOUS | Status: DC

## 2019-07-05 MED ORDER — SODIUM CHLORIDE 0.9% FLUSH
10.0000 mL | Freq: Once | INTRAVENOUS | Status: AC
  Administered 2019-07-05: 08:00:00 10 mL via INTRAVENOUS
  Filled 2019-07-05: qty 10

## 2019-07-05 MED ORDER — SODIUM CHLORIDE 0.9 % IV SOLN
400.0000 mg/m2 | Freq: Once | INTRAVENOUS | Status: AC
  Administered 2019-07-05: 11:00:00 580 mg via INTRAVENOUS
  Filled 2019-07-05: qty 29

## 2019-07-05 MED ORDER — DOXORUBICIN HCL CHEMO IV INJECTION 2 MG/ML
25.0000 mg/m2 | Freq: Once | INTRAVENOUS | Status: AC
  Administered 2019-07-05: 36 mg via INTRAVENOUS
  Filled 2019-07-05: qty 18

## 2019-07-05 MED ORDER — PALONOSETRON HCL INJECTION 0.25 MG/5ML
0.2500 mg | Freq: Once | INTRAVENOUS | Status: AC
  Administered 2019-07-05: 10:00:00 0.25 mg via INTRAVENOUS
  Filled 2019-07-05: qty 5

## 2019-07-05 MED ORDER — HEPARIN SOD (PORK) LOCK FLUSH 100 UNIT/ML IV SOLN
500.0000 [IU] | Freq: Once | INTRAVENOUS | Status: AC | PRN
  Administered 2019-07-05: 14:00:00 500 [IU]
  Filled 2019-07-05: qty 5

## 2019-07-05 MED ORDER — SODIUM CHLORIDE 0.9 % IV SOLN
Freq: Once | INTRAVENOUS | Status: AC
  Administered 2019-07-05: 10:00:00 via INTRAVENOUS
  Filled 2019-07-05: qty 5

## 2019-07-05 MED ORDER — SODIUM CHLORIDE 0.9 % IV SOLN
Freq: Once | INTRAVENOUS | Status: AC
  Administered 2019-07-05: 10:00:00 via INTRAVENOUS
  Filled 2019-07-05: qty 250

## 2019-07-05 MED ORDER — VINCRISTINE SULFATE CHEMO INJECTION 1 MG/ML
2.0000 mg | Freq: Once | INTRAVENOUS | Status: AC
  Administered 2019-07-05: 2 mg via INTRAVENOUS
  Filled 2019-07-05: qty 2

## 2019-07-05 MED ORDER — ACETAMINOPHEN 325 MG PO TABS
650.0000 mg | ORAL_TABLET | Freq: Once | ORAL | Status: AC
  Administered 2019-07-05: 650 mg via ORAL
  Filled 2019-07-05: qty 2

## 2019-07-05 MED ORDER — DIPHENHYDRAMINE HCL 25 MG PO CAPS
50.0000 mg | ORAL_CAPSULE | Freq: Once | ORAL | Status: AC
  Administered 2019-07-05: 10:00:00 50 mg via ORAL
  Filled 2019-07-05: qty 2

## 2019-07-05 NOTE — Progress Notes (Signed)
[name]  Telephone:([phone] [phone]   Name: Stephanie Davidson Date: 07/05/2019 MRN: [id number]  DOB: July 16, 1931  Patient Care Team: Stephanie Late, MD as PCP - General (Family Medicine)    REASON FOR CONSULTATION: Palliative Care consult requested for this 85 y.o. 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  Patient had severe symptoms from previous treatment and was initially reluctant to pursue further cancer treatment.  Patient was referred to palliative care to help address goals and provide with support.   SOCIAL HISTORY:     reports that she has never smoked. She has never used smokeless tobacco. She reports that she does not drink alcohol.   Patient is widowed.  She lives at [facility] in independent living.  She has a son and [place] and a daughter in [place].  ADVANCE DIRECTIVES:  Not on file.  Her son, Stephanie Davidson, is her healthcare power of attorney and her daughter, Stephanie Davidson, is her financial POA.  Patient reportedly has a living will.  CODE STATUS:   PAST MEDICAL HISTORY: Past Medical History:  Diagnosis Date   Breast cancer ([place])    Hypertension    Personal history of chemotherapy    Personal history of radiation therapy     PAST SURGICAL HISTORY:  Past Surgical History:  Procedure Laterality Date   BREAST BIOPSY Right ?   needle bx-benign   BREAST BIOPSY Left 05/12/2019   [place] bx 12:00, venus marker, path pending   BREAST EXCISIONAL BIOPSY Left 2003   lymphoma   BREAST EXCISIONAL BIOPSY Left ?   lump was removed, benign   IR IMAGING GUIDED PORT INSERTION  06/02/2019    HEMATOLOGY/ONCOLOGY HISTORY:  Oncology History Overview Note  # AUG 2020- LEFT BREAST DLBCL [non-GCB subtype; Double expressor;NEG- CD-30; Ki-67-80-90%]; FISH -negative for translocations; SEP  2020- PET-left breast intense uptake; no distant disease noted.  Hold off bone marrow biopsy [patient/family preference not to be aggressive]; Stage adjusted IPI-"2 points" cure rate 50-60%.   # 9/21- Pred+Rituxan;  -------------------------------------------------------------------------------------------- # 2006- DLBCL of Breast [s? 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    ;GOALS: Cure  CURRENT/MOST RECENT THERAPY:Rituxan-+Pred    Lymphoma of breast ([place])  05/25/2019 Initial Diagnosis   Lymphoma of breast ([place])   06/14/2019 -  Chemotherapy   The patient had DOXOrubicin (ADRIAMYCIN) chemo injection 36 mg, 25 mg/m2 = 36 mg (100 % of original dose 25 mg/m2), Intravenous,  Once, 1 of 3 cycles Dose modification: 25 mg/m2 (original dose 25 mg/m2, Cycle 2, Reason: Provider Judgment) palonosetron (ALOXI) injection 0.25 mg, 0.25 mg, Intravenous,  Once, 1 of 3 cycles pegfilgrastim-jmdb (FULPHILA) injection 6 mg, 6 mg, Subcutaneous,  Once, 1 of 3 cycles vinCRIStine (ONCOVIN) 2 mg in sodium chloride 0.9 % 50 mL chemo infusion, 2 mg, Intravenous,  Once, 1 of 3 cycles riTUXimab (RITUXAN) 500 mg in sodium chloride 0.9 % 250 mL (1.6667 mg/mL) infusion, 375 mg/m2 = 500 mg, Intravenous,  Once, 1 of 1 cycle Administration: 500 mg (06/14/2019) cyclophosphamide (CYTOXAN) 580 mg in sodium chloride 0.9 % 250 mL chemo infusion, 400 mg/m2 = 580 mg (100 % of original dose 400 mg/m2), Intravenous,  Once, 1 of 3 cycles Dose modification: 600 mg/m2 (original dose 400 mg/m2, Cycle 2, Reason: Provider Judgment),  400 mg/m2 (original dose 400 mg/m2, Cycle 2, Reason: Provider Judgment) fosaprepitant (EMEND) 150 mg, dexamethasone (DECADRON) 12 mg in sodium chloride 0.9 % 145 mL IVPB, , Intravenous,  Once, 1 of 3 cycles riTUXimab-pvvr (RUXIENCE) 500 mg in sodium chloride 0.9 % 250 mL (1.6667 mg/mL) infusion, 375 mg/m2 = 500 mg (100 % of original dose  375 mg/m2), Intravenous,  Once, 1 of 1 cycle Dose modification: 375 mg/m2 (original dose 375 mg/m2, Cycle 2, Reason: Other (see comments), Comment: insurance)  for chemotherapy treatment.      ALLERGIES:  has No Known Allergies.  MEDICATIONS:  Current Outpatient Medications  Medication Sig Dispense Refill   amLODipine (NORVASC) 10 MG tablet Take 1 tablet by mouth daily.     Calcium Carbonate-Vitamin D (CALTRATE 600+D PO) Take 1 tablet by mouth 2 (two) times daily.     docusate calcium (SURFAK) 240 MG capsule Take 1 capsule by mouth 2 (two) times daily.     donepezil (ARICEPT) 10 MG tablet Take 1 tablet by mouth at bedtime.     fluticasone (FLONASE) 50 MCG/ACT nasal spray Place 2 sprays into both nostrils daily.     hydrochlorothiazide (HYDRODIURIL) 25 MG tablet Take 1 tablet by mouth daily.     irbesartan (AVAPRO) 300 MG tablet Take 1 tablet by mouth daily.     lidocaine-prilocaine (EMLA) cream Apply to affected area once 30 g 3   meloxicam (MOBIC) 15 MG tablet Take 1 tablet by mouth as needed.     Multiple Vitamin (MULTI-VITAMIN) tablet Take 1 tablet by mouth daily.     Omega-3 Fatty Acids (FISH OIL) 1000 MG CAPS Take 1 tablet by mouth daily.     omeprazole (PRILOSEC) 20 MG capsule Take 1 capsule by mouth daily.     ondansetron (ZOFRAN) 8 MG tablet Take 1 tablet (8 mg total) by mouth 2 (two) times daily as needed for refractory nausea / vomiting. (Patient not taking: Reported on 06/21/2019) 30 tablet 1   predniSONE (DELTASONE) 20 MG tablet Take 3 tablets (60 mg total) by mouth daily with breakfast. Take on days 1-5 of chemotherapy. (Patient not taking: Reported on 07/02/2019) 60 tablet 1   prochlorperazine (COMPAZINE) 10 MG tablet Take 1 tablet (10 mg total) by mouth every 6 (six) hours as needed (Nausea or vomiting). (Patient not taking: Reported on 06/21/2019) 30 tablet 6   traZODone (DESYREL) 50 MG tablet Take 1 tablet by mouth 2 (two) times daily at 8 am and 10 pm.      No current facility-administered medications for this visit.     VITAL SIGNS: There were no vitals taken for this visit. There were no vitals filed for this visit.  Estimated body mass index is 20.22 kg/m as calculated from the following:   Height as of an earlier encounter on 07/05/19: _0  (1.549 m).   Weight as of an earlier encounter on 07/05/19: 107 lb (48.5 kg).  LABS: CBC:    Component Value Date/Time   WBC 5.5 07/05/2019 0803   HGB 11.9 (L) 07/05/2019 0803   HGB 14.1 10/28/2012 0950   HCT 36.3 07/05/2019 0803   HCT 41.4 10/28/2012 0950   PLT 182 07/05/2019 0803   PLT 200 10/28/2012 0950   MCV 90.3 07/05/2019 0803   MCV 92 10/28/2012 0950   NEUTROABS 3.4 07/05/2019 0803   NEUTROABS 3.8 10/28/2012 0950   LYMPHSABS 1.2 07/05/2019 0803   LYMPHSABS 1.4 10/28/2012 0950   MONOABS 0.5 07/05/2019 0803   MONOABS 0.4 10/28/2012  0950   EOSABS 0.3 07/05/2019 0803   EOSABS 0.2 10/28/2012 0950   BASOSABS 0.0 07/05/2019 0803   BASOSABS 0.0 10/28/2012 0950   Comprehensive Metabolic Panel:    Component Value Date/Time   NA 132 (L) 07/05/2019 0803   NA 139 10/28/2012 0950   K 3.8 07/05/2019 0803   K 3.4 (L) 02/18/2013 1508   CL 98 07/05/2019 0803   CL 98 10/28/2012 0950   CO2 26 07/05/2019 0803   CO2 33 (H) 10/28/2012 0950   BUN 17 07/05/2019 0803   BUN 16 10/28/2012 0950   CREATININE 0.77 07/05/2019 0803   CREATININE 0.74 10/28/2012 0950   GLUCOSE 116 (H) 07/05/2019 0803   GLUCOSE 105 (H) 10/28/2012 0950   CALCIUM 9.5 07/05/2019 0803   CALCIUM 10.0 10/28/2012 0950   AST 21 07/05/2019 0803   AST 26 10/28/2012 0950   ALT 15 07/05/2019 0803   ALT 28 10/28/2012 0950   ALKPHOS 95 07/05/2019 0803   ALKPHOS 142 (H) 10/28/2012 0950   BILITOT 0.7 07/05/2019 0803   BILITOT 0.4 10/28/2012 0950   PROT 7.0 07/05/2019 0803   PROT 8.2 10/28/2012 0950   ALBUMIN 4.1 07/05/2019 0803   ALBUMIN 4.0 10/28/2012 0950    RADIOGRAPHIC STUDIES: No results found.  PERFORMANCE  STATUS (ECOG) : 0 - Asymptomatic  Review of Systems Unless otherwise noted, a complete review of systems is negative.  Physical Exam General: NAD, frail appearing, thin Pulmonary: Unlabored Extremities: no edema, no joint deformities Skin: no rashes Neurological: Weakness but otherwise nonfocal  IMPRESSION: Routine follow-up visit today.  Patient was seen in the infusion area.  Patient says that she is doing well.  She offers no acute complaints or distressing symptoms today.  She denies any recent changes.  She feels she is tolerating treatment well.  She says that she still walking a mile or two a day.  She had home health services at [facility] coming in briefly but found that they really were not needed.    She reports good oral intake.  Weight is stable 107 pounds.  I have previously sent patient home with a MOST Form.  Patient says that she remembers the form but cannot remember she completed it.  She will ask her daughter today.  PLAN: -Continue current scope of treatment -MOST form reviewed -RTC in 2-4 weeks   Patient expressed understanding and was in agreement with this plan. She also understands that She can call the clinic at any time with any questions, concerns, or complaints.     Time Total: 15 minutes  Visit consisted of counseling and education dealing with the complex and emotionally intense issues of symptom management and palliative care in the setting of serious and potentially life-threatening illness.Greater than 50%  of this time was spent counseling and coordinating care related to the above assessment and plan.  Signed by: Altha Harm, PhD, NP-C [phone] (Work Cell)

## 2019-07-05 NOTE — Assessment & Plan Note (Addendum)
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 Stage I E. S/p 1 cycle of Rituxan 3weeks ago.  No clinical response noted so far.  Tolerated treatment fairly well.  # proceed with mini-RCHOP today; Labs today reviewed;  acceptable for treatment today.  # Dementia: Stable.  # I spoke at length with the patient's family/ Melissa- regarding the patient's clinical status/plan of care.  Family agreement.   # DISPOSITION: # Proceed with chemo today; d-2 fulphilia # 1 week- [facility]- labs-cbc/bmp # in 3 weeks- MD;labs-cbc/cmp/LDH. mini R-CHOP.; d-2 fluphilia-dr.B

## 2019-07-05 NOTE — Progress Notes (Signed)
[place] CONSULT NOTE  Patient Care Team: Derinda Late, MD as PCP - General (Family Medicine)  CHIEF COMPLAINTS/PURPOSE OF CONSULTATION: Breast lymphoma   Oncology History Overview Note  # AUG 2020- LEFT BREAST DLBCL [non-GCB subtype; Double expressor;NEG- CD-30; Ki-67-80-90%]; FISH -negative for translocations; SEP 2020- PET-left breast intense uptake; no distant disease noted.  Hold off bone marrow biopsy [patient/family preference not to be aggressive]; Stage adjusted IPI-"2 points" cure rate 50-60%.   # 9/21- Pred+Rituxan;  -------------------------------------------------------------------------------------------- # 2006- DLBCL of Breast [s? 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    ;GOALS: Cure  CURRENT/MOST RECENT THERAPY:Rituxan-+Pred    Lymphoma of breast ([place])  05/25/2019 Initial Diagnosis   Lymphoma of breast ([place])   06/14/2019 -  Chemotherapy   The patient had DOXOrubicin (ADRIAMYCIN) chemo injection 36 mg, 25 mg/m2 = 36 mg (100 % of original dose 25 mg/m2), Intravenous,  Once, 1 of 3 cycles Dose modification: 25 mg/m2 (original dose 25 mg/m2, Cycle 2, Reason: Provider Judgment) Administration: 36 mg (07/05/2019) palonosetron (ALOXI) injection 0.25 mg, 0.25 mg, Intravenous,  Once, 1 of 3 cycles Administration: 0.25 mg (07/05/2019) pegfilgrastim-jmdb (FULPHILA) injection 6 mg, 6 mg, Subcutaneous,  Once, 1 of 3 cycles vinCRIStine (ONCOVIN) 2 mg in sodium chloride 0.9 % 50 mL chemo infusion, 2 mg, Intravenous,  Once, 1 of 3 cycles Administration: 2 mg (07/05/2019) riTUXimab (RITUXAN) 500 mg in sodium chloride 0.9 % 250 mL (1.6667 mg/mL) infusion, 375 mg/m2 = 500 mg, Intravenous,  Once, 1 of 1 cycle Administration: 500 mg (06/14/2019) cyclophosphamide (CYTOXAN) 580 mg in sodium chloride 0.9 % 250 mL chemo infusion, 400 mg/m2 = 580 mg (100 % of original dose 400 mg/m2),  Intravenous,  Once, 1 of 3 cycles Dose modification: 600 mg/m2 (original dose 400 mg/m2, Cycle 2, Reason: Provider Judgment), 400 mg/m2 (original dose 400 mg/m2, Cycle 2, Reason: Provider Judgment) Administration: 580 mg (07/05/2019) fosaprepitant (EMEND) 150 mg, dexamethasone (DECADRON) 12 mg in sodium chloride 0.9 % 145 mL IVPB, , Intravenous,  Once, 1 of 3 cycles Administration:  (07/05/2019) riTUXimab-pvvr (RUXIENCE) 500 mg in sodium chloride 0.9 % 250 mL (1.6667 mg/mL) infusion, 375 mg/m2 = 500 mg (100 % of original dose 375 mg/m2), Intravenous,  Once, 1 of 1 cycle Dose modification: 375 mg/m2 (original dose 375 mg/m2, Cycle 2, Reason: Other (see comments), Comment: insurance)  for chemotherapy treatment.       HISTORY OF PRESENTING ILLNESS:  Stephanie Davidson 85 y.o.  female diffuse large B-cell lymphoma of the left breast is here mini R-CHOP.  Patient is started Rituxan single agent-prednisone approximately 3 weeks ago.  She tolerated very well.  Patient denies any significant improvement of the left breast mass or worsening.   Review of Systems  Constitutional: Negative for chills, diaphoresis, fever and malaise/fatigue.  HENT: Negative for nosebleeds and sore throat.   Eyes: Negative for double vision.  Respiratory: Negative for cough, hemoptysis, sputum production, shortness of breath and wheezing.   Cardiovascular: Negative for chest pain, palpitations, orthopnea and leg swelling.  Gastrointestinal: Negative for abdominal pain, blood in stool, constipation, diarrhea, heartburn, melena, nausea and vomiting.  Genitourinary: Negative for dysuria, frequency and urgency.  Musculoskeletal: Positive for back pain and joint pain.  Skin: Negative.  Negative for itching and rash.  Neurological: Negative for dizziness, tingling, focal weakness, weakness and headaches.  Endo/Heme/Allergies: Does not bruise/bleed easily.  Psychiatric/Behavioral: Positive for memory loss.  Negative for  depression. The patient is not nervous/anxious and does not have insomnia.      MEDICAL HISTORY:  Past Medical History:  Diagnosis Date  . Breast cancer ([place])   . Hypertension   . Personal history of chemotherapy   . Personal history of radiation therapy     SURGICAL HISTORY: Past Surgical History:  Procedure Laterality Date  . BREAST BIOPSY Right ?   needle bx-benign  . BREAST BIOPSY Left 05/12/2019   [place] bx 12:00, venus marker, path pending  . BREAST EXCISIONAL BIOPSY Left 2003   lymphoma  . BREAST EXCISIONAL BIOPSY Left ?   lump was removed, benign  . IR IMAGING GUIDED PORT INSERTION  06/02/2019    SOCIAL HISTORY: Social History   Socioeconomic History  . Marital status: Single    Spouse name: Not on file  . Number of children: 2  . Years of education: Not on file  . Highest education level: Not on file  Occupational History  . Not on file  Social Needs  . Financial resource strain: Not hard at all  . Food insecurity    Worry: Never true    Inability: Never true  . Transportation needs    Medical: No    Non-medical: No  Tobacco Use  . Smoking status: Never Smoker  . Smokeless tobacco: Never Used  Substance and Sexual Activity  . Alcohol use: No  . Drug use: Not on file  . Sexual activity: Not on file  Lifestyle  . Physical activity    Days per week: 7 days    Minutes per session: 30 min  . Stress: Only a little  Relationships  . Social connections    Talks on phone: More than three times a week    Gets together: More than three times a week    Attends religious service: Not on file    Active member of club or organization: No    Attends meetings of clubs or organizations: Never    Relationship status: Not on file  . Intimate partner violence    Fear of current or ex partner: No    Emotionally abused: No    Physically abused: No    Forced sexual activity: No  Other Topics Concern  . Not on file  Social History Narrative   [place]; own  apartment; no smoking; no alcohol. 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     FAMILY HISTORY: Family History  Problem Relation Age of Onset  . Breast cancer Neg Hx     ALLERGIES:  has No Known Allergies.  MEDICATIONS:  Current Outpatient Medications  Medication Sig Dispense Refill  . amLODipine (NORVASC) 10 MG tablet Take 1 tablet by mouth daily.    . Calcium Carbonate-Vitamin D (CALTRATE 600+D PO) Take 1 tablet by mouth 2 (two) times daily.    Marland Kitchen docusate calcium (SURFAK) 240 MG capsule Take 1 capsule by mouth 2 (two) times daily.    Marland Kitchen donepezil (ARICEPT) 10 MG tablet Take 1 tablet by mouth at bedtime.    . fluticasone (FLONASE) 50 MCG/ACT nasal spray Place 2 sprays into both nostrils daily.    . hydrochlorothiazide (HYDRODIURIL) 25 MG tablet Take 1 tablet by mouth daily.    . irbesartan (AVAPRO) 300 MG tablet Take 1 tablet by mouth daily.    Marland Kitchen lidocaine-prilocaine (EMLA) cream Apply to affected area once 30 g 3  . meloxicam (MOBIC) 15 MG tablet Take 1 tablet by mouth as needed.    Marland Kitchen  Multiple Vitamin (MULTI-VITAMIN) tablet Take 1 tablet by mouth daily.    . Omega-3 Fatty Acids (FISH OIL) 1000 MG CAPS Take 1 tablet by mouth daily.    Marland Kitchen omeprazole (PRILOSEC) 20 MG capsule Take 1 capsule by mouth daily.    . ondansetron (ZOFRAN) 8 MG tablet Take 1 tablet (8 mg total) by mouth 2 (two) times daily as needed for refractory nausea / vomiting. (Patient not taking: Reported on 06/21/2019) 30 tablet 1  . predniSONE (DELTASONE) 20 MG tablet Take 3 tablets (60 mg total) by mouth daily with breakfast. Take on days 1-5 of chemotherapy. (Patient not taking: Reported on 07/02/2019) 60 tablet 1  . prochlorperazine (COMPAZINE) 10 MG tablet Take 1 tablet (10 mg total) by mouth every 6 (six) hours as needed (Nausea or vomiting). (Patient not taking: Reported on 06/21/2019) 30 tablet 6  . traZODone (DESYREL) 50 MG tablet Take 1 tablet by mouth 2 (two) times daily at 8 am and 10 pm.     No current facility-administered medications  for this visit.       Marland Kitchen  PHYSICAL EXAMINATION: ECOG PERFORMANCE STATUS: 1 - Symptomatic but completely ambulatory  Vitals:   07/05/19 0832  BP: (!) 149/56  Pulse: 69  Resp: 18  Temp: (!) 96.2 F (35.7 C)   Filed Weights   07/05/19 0832  Weight: 107 lb (48.5 kg)    Physical Exam  Constitutional: She is oriented to person, place, and time and well-developed, well-nourished, and in no distress.  HENT:  Head: Normocephalic and atraumatic.  Mouth/Throat: Oropharynx is clear and moist. No oropharyngeal exudate.  Eyes: Pupils are equal, round, and reactive to light.  Neck: Normal range of motion. Neck supple.  Cardiovascular: Normal rate and regular rhythm.  Pulmonary/Chest: Effort normal and breath sounds normal. No respiratory distress. She has no wheezes.  Abdominal: Soft. Bowel sounds are normal. She exhibits no distension and no mass. There is no abdominal tenderness. There is no rebound and no guarding.  Musculoskeletal: Normal range of motion.        General: No tenderness or edema.  Neurological: She is alert and oriented to person, place, and time.  Skin: Skin is warm.  Left breast upper inner quadrant-5.5 x 4.5 cm mobile mass noted.  Psychiatric: Affect normal.     LABORATORY DATA:  I have reviewed the data as listed Lab Results  Component Value Date   WBC 5.5 07/05/2019   HGB 11.9 (L) 07/05/2019   HCT 36.3 07/05/2019   MCV 90.3 07/05/2019   PLT 182 07/05/2019   Recent Labs    05/26/19 1237 06/14/19 0854 06/22/19 1015 07/05/19 0803  NA 136 134* 133* 132*  K 3.9 4.0 4.2 3.8  CL 99 100 100 98  CO2 _0 GLUCOSE 108* 110* 120* 116*  BUN 25* _1 CREATININE 0.82 0.86 0.69 0.77  CALCIUM 10.3 9.5 9.3 9.5  GFRNONAA >60 >60 >60 >60  GFRAA >60 >60 >60 >60  PROT 8.5* 7.4  --  7.0  ALBUMIN 4.9 4.3  --  4.1  AST 29 24  --  21  ALT 21 19  --  15  ALKPHOS 118 102  --  95  BILITOT 0.6 0.5  --  0.7    RADIOGRAPHIC STUDIES: I have personally  reviewed the radiological images as listed and agreed with the findings in the report. No results found.  ASSESSMENT & PLAN:   Lymphoma of breast ([place]) #Diffuse large B-cell  lymphoma left breast Non-GCB subtype; double expresser. Stage I E. S/p 1 cycle of Rituxan 3weeks ago.  No clinical response noted so far.  Tolerated treatment fairly well.  # proceed with mini-RCHOP today; Labs today reviewed;  acceptable for treatment today.  # Dementia: Stable.  # I spoke at length with the patient's family/ Melissa- regarding the patient's clinical status/plan of care.  Family agreement.   # DISPOSITION: # Proceed with chemo today; d-2 fulphilia # 1 week- [facility]- labs-cbc/bmp # in 3 weeks- MD;labs-cbc/cmp/LDH. mini R-CHOP.; d-2 fluphilia-dr.B    All questions were answered. The patient knows to call the clinic with any problems, questions or concerns.    Cammie Sickle, MD 07/05/2019 5:53 PM

## 2019-07-06 ENCOUNTER — Inpatient Hospital Stay: Payer: Medicare Other

## 2019-07-06 ENCOUNTER — Other Ambulatory Visit: Payer: Self-pay

## 2019-07-06 DIAGNOSIS — C8599 Non-Hodgkin lymphoma, unspecified, extranodal and solid organ sites: Secondary | ICD-10-CM

## 2019-07-06 DIAGNOSIS — Z5111 Encounter for antineoplastic chemotherapy: Secondary | ICD-10-CM | POA: Diagnosis not present

## 2019-07-06 MED ORDER — PEGFILGRASTIM-JMDB 6 MG/0.6ML ~~LOC~~ SOSY
6.0000 mg | PREFILLED_SYRINGE | Freq: Once | SUBCUTANEOUS | Status: AC
  Administered 2019-07-06: 16:00:00 6 mg via SUBCUTANEOUS
  Filled 2019-07-06: qty 0.6

## 2019-07-08 ENCOUNTER — Other Ambulatory Visit: Payer: Self-pay | Admitting: *Deleted

## 2019-07-08 DIAGNOSIS — C8599 Non-Hodgkin lymphoma, unspecified, extranodal and solid organ sites: Secondary | ICD-10-CM

## 2019-07-12 ENCOUNTER — Inpatient Hospital Stay: Payer: Medicare Other

## 2019-07-12 ENCOUNTER — Inpatient Hospital Stay (HOSPITAL_BASED_OUTPATIENT_CLINIC_OR_DEPARTMENT_OTHER): Payer: Medicare Other | Admitting: Oncology

## 2019-07-12 ENCOUNTER — Other Ambulatory Visit: Payer: Self-pay

## 2019-07-12 ENCOUNTER — Other Ambulatory Visit: Payer: Self-pay | Admitting: *Deleted

## 2019-07-12 VITALS — BP 141/71 | HR 90 | Temp 97.8°F | Resp 18 | Wt 107.8 lb

## 2019-07-12 DIAGNOSIS — E871 Hypo-osmolality and hyponatremia: Secondary | ICD-10-CM

## 2019-07-12 DIAGNOSIS — M546 Pain in thoracic spine: Secondary | ICD-10-CM

## 2019-07-12 DIAGNOSIS — C8599 Non-Hodgkin lymphoma, unspecified, extranodal and solid organ sites: Secondary | ICD-10-CM

## 2019-07-12 DIAGNOSIS — Z95828 Presence of other vascular implants and grafts: Secondary | ICD-10-CM

## 2019-07-12 DIAGNOSIS — E860 Dehydration: Secondary | ICD-10-CM | POA: Diagnosis not present

## 2019-07-12 DIAGNOSIS — Z5111 Encounter for antineoplastic chemotherapy: Secondary | ICD-10-CM | POA: Diagnosis not present

## 2019-07-12 DIAGNOSIS — F0390 Unspecified dementia without behavioral disturbance: Secondary | ICD-10-CM | POA: Diagnosis not present

## 2019-07-12 LAB — CBC WITH DIFFERENTIAL/PLATELET
Abs Immature Granulocytes: 0.09 10*3/uL — ABNORMAL HIGH (ref 0.00–0.07)
Band Neutrophils: 16 %
Basophils Absolute: 0 10*3/uL (ref 0.0–0.1)
Basophils Relative: 0 %
Blasts: 0 %
Eosinophils Absolute: 0.2 10*3/uL (ref 0.0–0.5)
Eosinophils Relative: 5 %
HCT: 38 % (ref 36.0–46.0)
Hemoglobin: 12.7 g/dL (ref 12.0–15.0)
Lymphocytes Relative: 25 %
Lymphs Abs: 1.2 10*3/uL (ref 0.7–4.0)
MCH: 29.7 pg (ref 26.0–34.0)
MCHC: 33.4 g/dL (ref 30.0–36.0)
MCV: 89 fL (ref 80.0–100.0)
Metamyelocytes Relative: 2 %
Monocytes Absolute: 0.6 10*3/uL (ref 0.1–1.0)
Monocytes Relative: 14 %
Myelocytes: 0 %
Neutro Abs: 2.5 10*3/uL (ref 1.7–7.7)
Neutrophils Relative %: 38 %
Other: 0 %
Platelets: 245 10*3/uL (ref 150–400)
Promyelocytes Relative: 0 %
RBC Morphology: NORMAL
RBC: 4.27 MIL/uL (ref 3.87–5.11)
RDW: 13 % (ref 11.5–15.5)
Smear Review: ADEQUATE
WBC: 4.6 10*3/uL (ref 4.0–10.5)
nRBC: 0 % (ref 0.0–0.2)
nRBC: 0 /100 WBC

## 2019-07-12 LAB — URINALYSIS, COMPLETE (UACMP) WITH MICROSCOPIC
Bilirubin Urine: NEGATIVE
Glucose, UA: NEGATIVE mg/dL
Hgb urine dipstick: NEGATIVE
Ketones, ur: NEGATIVE mg/dL
Leukocytes,Ua: NEGATIVE
Nitrite: NEGATIVE
Protein, ur: NEGATIVE mg/dL
Specific Gravity, Urine: 1.004 — ABNORMAL LOW (ref 1.005–1.030)
pH: 6 (ref 5.0–8.0)

## 2019-07-12 LAB — BASIC METABOLIC PANEL
Anion gap: 8 (ref 5–15)
BUN: 16 mg/dL (ref 8–23)
CO2: 25 mmol/L (ref 22–32)
Calcium: 9.5 mg/dL (ref 8.9–10.3)
Chloride: 96 mmol/L — ABNORMAL LOW (ref 98–111)
Creatinine, Ser: 0.77 mg/dL (ref 0.44–1.00)
GFR calc Af Amer: >60 mL/min (ref >60)
GFR calc non Af Amer: >60 mL/min (ref >60)
Glucose, Bld: 121 mg/dL — ABNORMAL HIGH (ref 70–99)
Potassium: 4.2 mmol/L (ref 3.5–5.1)
Sodium: 129 mmol/L — ABNORMAL LOW (ref 135–145)

## 2019-07-12 LAB — LACTATE DEHYDROGENASE: LDH: 226 U/L — ABNORMAL HIGH (ref 98–192)

## 2019-07-12 MED ORDER — SODIUM CHLORIDE 0.9 % IV SOLN
Freq: Once | INTRAVENOUS | Status: AC
  Administered 2019-07-12: 10:00:00 via INTRAVENOUS
  Filled 2019-07-12: qty 250

## 2019-07-12 MED ORDER — HEPARIN SOD (PORK) LOCK FLUSH 100 UNIT/ML IV SOLN: 500.0000 [IU] | Freq: Once | INTRAVENOUS | Status: DC

## 2019-07-12 MED ORDER — SODIUM CHLORIDE 0.9% FLUSH
10.0000 mL | INTRAVENOUS | Status: DC | PRN
  Filled 2019-07-12: qty 10

## 2019-07-12 NOTE — Progress Notes (Signed)
Symptom Management Consult note [facility]  [phone] [phone]  Patient Care Team: Derinda Late, MD as PCP - General (Family Medicine)   Name of the patient: Stephanie Davidson  [id number]  [birth year]   Date of visit: 07/12/2019   Diagnosis- Lymphoma  Chief complaint/ Reason for visit- back pain/fatigue  Heme/Onc history:  Oncology History Overview Note  # AUG 2020- LEFT BREAST DLBCL [non-GCB subtype; Double expressor;NEG- CD-30; Ki-67-80-90%]; FISH -negative for translocations; SEP 2020- PET-left breast intense uptake; no distant disease noted.  Hold off bone marrow biopsy [patient/family preference not to be aggressive]; Stage adjusted IPI-"2 points" cure rate 50-60%.   # 9/21- Pred+Rituxan;  -------------------------------------------------------------------------------------------- # 2006- DLBCL of Breast [s? 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    ;GOALS: Cure  CURRENT/MOST RECENT THERAPY:Rituxan-+Pred    Lymphoma of breast ([place])  05/25/2019 Initial Diagnosis   Lymphoma of breast ([place])   06/14/2019 -  Chemotherapy   The patient had DOXOrubicin (ADRIAMYCIN) chemo injection 36 mg, 25 mg/m2 = 36 mg (100 % of original dose 25 mg/m2), Intravenous,  Once, 1 of 3 cycles Dose modification: 25 mg/m2 (original dose 25 mg/m2, Cycle 2, Reason: Provider Judgment) Administration: 36 mg (07/05/2019) palonosetron (ALOXI) injection 0.25 mg, 0.25 mg, Intravenous,  Once, 1 of 3 cycles Administration: 0.25 mg (07/05/2019) pegfilgrastim-jmdb (FULPHILA) injection 6 mg, 6 mg, Subcutaneous,  Once, 1 of 3 cycles Administration: 6 mg (07/06/2019) vinCRIStine (ONCOVIN) 2 mg in sodium chloride 0.9 % 50 mL chemo infusion, 2 mg, Intravenous,  Once, 1 of 3 cycles Administration: 2 mg (07/05/2019) riTUXimab (RITUXAN) 500 mg in sodium chloride 0.9 % 250 mL (1.6667 mg/mL)  infusion, 375 mg/m2 = 500 mg, Intravenous,  Once, 1 of 1 cycle Administration: 500 mg (06/14/2019) cyclophosphamide (CYTOXAN) 580 mg in sodium chloride 0.9 % 250 mL chemo infusion, 400 mg/m2 = 580 mg (100 % of original dose 400 mg/m2), Intravenous,  Once, 1 of 3 cycles Dose modification: 600 mg/m2 (original dose 400 mg/m2, Cycle 2, Reason: Provider Judgment), 400 mg/m2 (original dose 400 mg/m2, Cycle 2, Reason: Provider Judgment) Administration: 580 mg (07/05/2019) fosaprepitant (EMEND) 150 mg, dexamethasone (DECADRON) 12 mg in sodium chloride 0.9 % 145 mL IVPB, , Intravenous,  Once, 1 of 3 cycles Administration:  (07/05/2019) riTUXimab-pvvr (RUXIENCE) 500 mg in sodium chloride 0.9 % 250 mL (1.6667 mg/mL) infusion, 375 mg/m2 = 500 mg (100 % of original dose 375 mg/m2), Intravenous,  Once, 1 of 1 cycle Dose modification: 375 mg/m2 (original dose 375 mg/m2, Cycle 2, Reason: Other (see comments), Comment: insurance)  for chemotherapy treatment.     Interval history-patient presents to symptom management for complaints of acute thoracic midline back pain and fatigue.  She recently had chemotherapy for left breast lymphoma.  Current regimen is mini R-CHOP.  First cycle was Rituxan only.  She tolerated well with no clinical response noted thus far but proceeded with cycle 1 mini R-CHOP last week with growth factor support.  Patient has significant dementia so interview and assessment was completed with son [name]; patients son on the speakerphone.  She states her back pain began approximately 5 days ago.  Son notes first complaint to be this morning.  Back pain is located at the midline thoracic region.  She has been taking Tylenol 350 mg as needed for pain with relief.  She also complains of generalized weakness and fatigue.  States  she is not sleeping well.  Endorses 4 to 5 hours of sleep nightly.  Son, [name] states she has had problems with insomnia for many years.  He thought she was taking a medication but is  unsure at this time.  This was prescribed by PCP.  She also notes constipation.  Takes MiraLAX daily prn with bowel movement.  Denies abdominal pain.  Denies diarrhea.  Her appetite has been fair.  She lives at [facility] and has food delivered to her home.  States she eats "one good meal followed by snacks".  Continues to be active and walks approximately half a mile to one mile daily.  Previously walked greater than 1 mile.  States she has "no energy".  She denies any neurological complaints including confusion.  She does have dementia.  She denies fever, nausea or vomiting, chest pain or urinary concerns.  ECOG FS:1 - Symptomatic but completely ambulatory  Review of systems- Review of Systems  Constitutional: Positive for malaise/fatigue.  Gastrointestinal: Positive for constipation.  Musculoskeletal: Positive for back pain.  Neurological: Positive for weakness.  Psychiatric/Behavioral: Positive for memory loss. The patient has insomnia.     Current treatment- s/p 1 cycle Rituxin only to access tolerance followed by Mini-CHOP given last week (07/05/19).  No Known Allergies   Past Medical History:  Diagnosis Date  . Breast cancer ([place])   . Hypertension   . Personal history of chemotherapy   . Personal history of radiation therapy      Past Surgical History:  Procedure Laterality Date  . BREAST BIOPSY Right ?   needle bx-benign  . BREAST BIOPSY Left 05/12/2019   [place] bx 12:00, [place] marker, path pending  . BREAST EXCISIONAL BIOPSY Left 2003   lymphoma  . BREAST EXCISIONAL BIOPSY Left ?   lump was removed, benign  . IR IMAGING GUIDED PORT INSERTION  06/02/2019    Social History   Socioeconomic History  . Marital status: Single    Spouse name: Not on file  . Number of children: 2  . Years of education: Not on file  . Highest education level: Not on file  Occupational History  . Not on file  Social Needs  . Financial resource strain: Not hard at all  . Food insecurity     Worry: Never true    Inability: Never true  . Transportation needs    Medical: No    Non-medical: No  Tobacco Use  . Smoking status: Never Smoker  . Smokeless tobacco: Never Used  Substance and Sexual Activity  . Alcohol use: No  . Drug use: Not on file  . Sexual activity: Not on file  Lifestyle  . Physical activity    Days per week: 7 days    Minutes per session: 30 min  . Stress: Only a little  Relationships  . Social connections    Talks on phone: More than three times a week    Gets together: More than three times a week    Attends religious service: Not on file    Active member of club or organization: No    Attends meetings of clubs or organizations: Never    Relationship status: Not on file  . Intimate partner violence    Fear of current or ex partner: No    Emotionally abused: No    Physically abused: No    Forced sexual activity: No  Other Topics Concern  . Not on file  Social History Narrative   [place]; own apartment; no smoking; no alcohol. 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     Family History  Problem Relation Age of Onset  . Breast cancer Neg Hx      Current Outpatient Medications:  .  acetaminophen (TYLENOL) 500 MG tablet, Take 500 mg by mouth every 6 (six) hours as needed., Disp: , Rfl:  .  amLODipine (NORVASC) 10 MG tablet, Take 1 tablet by mouth daily., Disp: , Rfl:  .  Calcium Carbonate-Vitamin D (CALTRATE 600+D PO), Take 1 tablet by mouth 2 (two) times daily., Disp: , Rfl:  .  docusate calcium (SURFAK) 240 MG capsule, Take 1 capsule by mouth 2 (two) times daily., Disp: , Rfl:  .  donepezil (ARICEPT) 10 MG tablet, Take 1 tablet by mouth at bedtime., Disp: , Rfl:  .  fluticasone (FLONASE) 50 MCG/ACT nasal spray, Place 2 sprays into both nostrils daily., Disp: , Rfl:  .  hydrochlorothiazide (HYDRODIURIL) 25 MG tablet, Take 1 tablet by mouth daily., Disp: , Rfl:  .  irbesartan (AVAPRO) 300 MG tablet, Take 1 tablet by mouth daily., Disp: , Rfl:  .   lidocaine-prilocaine (EMLA) cream, Apply to affected area once, Disp: 30 g, Rfl: 3 .  meloxicam (MOBIC) 15 MG tablet, Take 1 tablet by mouth as needed., Disp: , Rfl:  .  Multiple Vitamin (MULTI-VITAMIN) tablet, Take 1 tablet by mouth daily., Disp: , Rfl:  .  Omega-3 Fatty Acids (FISH OIL) 1000 MG CAPS, Take 1 tablet by mouth daily., Disp: , Rfl:  .  omeprazole (PRILOSEC) 20 MG capsule, Take 1 capsule by mouth daily., Disp: , Rfl:  .  traZODone (DESYREL) 50 MG tablet, Take 1 tablet by mouth 2 (two) times daily at 8 am and 10 pm., Disp: , Rfl:  .  ondansetron (ZOFRAN) 8 MG tablet, Take 1 tablet (8 mg total) by mouth 2 (two) times daily as needed for refractory nausea / vomiting. (Patient not taking: Reported on 06/21/2019), Disp: 30 tablet, Rfl: 1 .  predniSONE (DELTASONE) 20 MG tablet, Take 3 tablets (60 mg total) by mouth daily with breakfast. Take on days 1-5 of chemotherapy. (Patient not taking: Reported on 07/02/2019), Disp: 60 tablet, Rfl: 1 .  prochlorperazine (COMPAZINE) 10 MG tablet, Take 1 tablet (10 mg total) by mouth every 6 (six) hours as needed (Nausea or vomiting). (Patient not taking: Reported on 06/21/2019), Disp: 30 tablet, Rfl: 6 No current facility-administered medications for this visit.   Facility-Administered Medications Ordered in Other Visits:  .  heparin lock flush 100 unit/mL, 500 Units, Intravenous, Once, Burns, Jennifer E, NP .  sodium chloride flush (NS) 0.9 % injection 10 mL, 10 mL, Intravenous, PRN, Jacquelin Hawking, NP  Physical exam:  Vitals:   07/12/19 0925 07/12/19 0930  BP: (!) 141/71   Pulse:  90  Resp: 18 18  Temp: 97.8 F (36.6 C)   TempSrc: Tympanic   Weight: 107 lb 12.8 oz (48.9 kg)    Physical Exam Constitutional:      Appearance: Normal appearance.  HENT:     Head: Normocephalic and atraumatic.  Eyes:     Pupils: Pupils are equal, round, and reactive to light.  Neck:     Musculoskeletal: Normal range of motion.  Cardiovascular:     Rate and  Rhythm: Normal rate and regular rhythm.     Heart sounds: Normal heart sounds. No murmur.  Pulmonary:     Effort: Pulmonary effort is normal.     Breath sounds: Normal breath sounds. No wheezing.  Abdominal:     General: Bowel sounds are normal. There is no distension.     Palpations: Abdomen is soft.     Tenderness: There is no abdominal tenderness.  Musculoskeletal: Normal range of motion.        General: Tenderness present.     Thoracic back: She exhibits tenderness, pain and spasm. She exhibits no swelling, no edema, no deformity and no laceration.       Arms:  Skin:    General: Skin is warm and dry.     Findings: No rash.  Neurological:     Mental Status: She is alert and oriented to person, place, and time.  Psychiatric:        Attention and Perception: Attention normal.        Mood and Affect: Mood normal.        Behavior: Behavior normal.        Judgment: Judgment normal.      CMP Latest Ref Rng & Units 07/12/2019  Glucose 70 - 99 mg/dL 121(H)  BUN 8 - 23 mg/dL 16  Creatinine 0.44 - 1.00 mg/dL 0.77  Sodium 135 - 145 mmol/L 129(L)  Potassium 3.5 - 5.1 mmol/L 4.2  Chloride 98 - 111 mmol/L 96(L)  CO2 22 - 32 mmol/L 25  Calcium 8.9 - 10.3 mg/dL 9.5  Total Protein 6.5 - 8.1 g/dL -  Total Bilirubin 0.3 - 1.2 mg/dL -  Alkaline Phos 38 - 126 U/L -  AST 15 - 41 U/L -  ALT 0 - 44 U/L -   CBC Latest Ref Rng & Units 07/12/2019  WBC 4.0 - 10.5 K/uL 4.6  Hemoglobin 12.0 - 15.0 g/dL 12.7  Hematocrit 36.0 - 46.0 % 38.0  Platelets 150 - 400 K/uL 245    No images are attached to the encounter.  No results found.  Assessment and plan- Patient is a 85 y.o. female who presents to [facility] for complaints of fatigue, insomnia and thoracic midline back pain.   Diffuse large B-cell lymphoma of left breast: Stage I E.  She is status post 1 cycle of Rituxan and 1 cycle of mini RCHOP.  Tolerated treatment fairly well.  She is scheduled for cycle 2 of mini RCHOP on 07/26/2019.   Insomnia: Unclear of how long this has been going on.  Patient states only a few days/weeks.  Son certain she has been having trouble for years.  Currently taking trazodone twice daily for dementia.  Has not tried any supplements/OTC meds.  Would recommend melatonin 1 to 2 mg at bedtime to see if this is helpful.  I think this is due to several reasons including prednisone (60 mg days 1-5 of chemotherapy). Last dose should have been on Friday.  Hopefully will see improvement this week given she is no longer on steroids.  Back pain: Unclear etiology.  Tylenol 350 mg to 600 mg appears to be helping the pain.  She denies any injury.  We will try conservative measures at this time including heat, NSAIDs and Tylenol as needed.  I will have her come back on Friday for reevaluation.  If symptoms worsen or fail to improve we may get imaging at that time.  Hyponatremia: Sodium level has been slowly trending down over the course of the past 2 months.  Today sodium level is 129.  Vital signs are okay.  All other electrolytes are okay.  We will give fluids today while in clinic.  Spoke at length about hydration and ways to  get her sodium level up.  If it continues to trend down will consider SIADH work-up. Medications such as Cyclophosphamide, vincristine and Trazadone have been linked to increase risk of ADH release/effect.  SIADH should be suspected in patients with hyponatremia, hypoosmolality and urine osmolality greater than 100 milmols/per kilogram.  Urine sodium concentration is usually greater than 40 mEq/L, serum potassium is normal, no acid-base disturbance and serum uric acid concentration is frequently low.  Plan: Stat lab work. Stat urinalysis/urine culture. Give 1 L NaCl. Take 1-2 mg of melatonin at bedtime.  Take tylenol 350-600 mg tylenol or Ibuprofen 400 mg q 6 hours for back pain.  Use heat prn.   Disposition: RTC on Friday for reevaluation with labs including CMP, CBC and Magnesium. Will check  urine sodium, urine osmolarity and uric acid.   Visit Diagnosis 1. Acute midline thoracic back pain     Patient expressed understanding and was in agreement with this plan. She also understands that She can call clinic at any time with any questions, concerns, or complaints.   Greater than 50% was spent in counseling and coordination of care with this patient including but not limited to discussion of the relevant topics above (See A&P) including, but not limited to diagnosis and management of acute and chronic medical conditions.   Thank you for allowing me to participate in the care of this very pleasant patient.    Jacquelin Hawking, NP [place] [facility] Cell - [phone] Pager- [phone] 07/12/2019 1:03 PM

## 2019-07-12 NOTE — Patient Instructions (Signed)
I am so sorry you are not feeling well today.  Today you were seen for fatigue and back pain.  I am also sorry you are not sleeping well.  I would recommend taking over-the-counter melatonin 1 to 2 mg each night.  You are currently taking trazodone which is often used for sleep but it appears you are taking this twice per day.  Will you check that medication at home and see if you are actually taking this.  For your back pain I would try rotating Tylenol with ibuprofen.  You could take 350 mg to 600 mg Tylenol every 6 hours and ibuprofen 400 mg every 6 hours.  If the pain is persistent, then I would recommend possible images to make sure there is no injury.  I will call your son can and let him know of the updates.  We also checked your urine to make sure that you do not have a urinary tract infection.  Please try to drink lots of fluids.  I recommend water, Gatorade, propel and ensures.  Faythe Casa, NP 07/12/2019 11:35 AM  Hyponatremia Hyponatremia is when the amount of salt (sodium) in your blood is too low. When salt levels are low, your body may take in extra water. This can cause swelling throughout the body. The swelling often affects the brain. What are the causes? This condition may be caused by: Certain medical problems or conditions. Vomiting a lot. Having watery poop (diarrhea) often. Certain medicines or illegal drugs. Not having enough water in the body (dehydration). Drinking too much water. Eating a diet that is low in salt. Large burns on your body. Too much sweating. What increases the risk? You are more likely to get this condition if you: Have long-term (chronic) kidney disease. Have heart failure. Have a medical condition that causes you to have watery poop often. Do very hard exercises. Take medicines that affect the amount of salt is in your blood. What are the signs or symptoms? Symptoms of this condition include: Headache. Feeling like you may vomit  (nausea). Vomiting. Being very tired (lethargic). Muscle weakness and cramps. Not wanting to eat as much as normal (loss of appetite). Feeling weak or light-headed. Severe symptoms of this condition include: Confusion. Feeling restless (agitation). Having a fast heart rate. Passing out (fainting). Seizures. Coma. How is this treated? Treatment for this condition depends on the cause. Treatment may include: Getting fluids through an IV tube that is put into one of your veins. Taking medicines to fix the salt levels in your blood. If medicines are causing the problem, your medicines will need to be changed. Limiting how much water or fluid you take in. Monitoring in the hospital to watch your symptoms. Follow these instructions at home:  Take over-the-counter and prescription medicines only as told by your doctor. Many medicines can make this condition worse. Talk with your doctor about any medicines that you are taking. Eat and drink exactly as you are told by your doctor. Eat only the foods you are told to eat. Limit how much fluid you take. Do not drink alcohol. Keep all follow-up visits as told by your doctor. This is important. Contact a doctor if: You feel more like you may vomit. You feel more tired. Your headache gets worse. You feel more confused. You feel weaker. Your symptoms go away and then they come back. You have trouble following the diet instructions. Get help right away if: You have a seizure. You pass out. You keep having  watery poop. You keep vomiting. Summary Hyponatremia is when the amount of salt in your blood is too low. When salt levels are low, you can have swelling throughout the body. The swelling mostly affects the brain. Treatment depends on the cause. Treatment may include getting IV fluids, medicines, or not drinking as much fluid. This information is not intended to replace advice given to you by your health care provider. Make sure you  discuss any questions you have with your health care provider. Document Released: 05/22/2011 Document Revised: 11/26/2018 Document Reviewed: 08/13/2018 Elsevier Patient Education  [place].  Dehydration, Adult  Dehydration is when there is not enough fluid or water in your body. This happens when you lose more fluids than you take in. Dehydration can range from mild to very bad. It should be treated right away to keep it from getting very bad. Symptoms of mild dehydration may include:  Thirst.  Dry lips.  Slightly dry mouth.  Dry, warm skin.  Dizziness. Symptoms of moderate dehydration may include:  Very dry mouth.  Muscle cramps.  Dark pee (urine). Pee may be the color of tea.  Your body making less pee.  Your eyes making fewer tears.  Heartbeat that is uneven or faster than normal (palpitations).  Headache.  Light-headedness, especially when you stand up from sitting.  Fainting (syncope). Symptoms of very bad dehydration may include:  Changes in skin, such as: ? Cold and clammy skin. ? Blotchy (mottled) or pale skin. ? Skin that does not quickly return to normal after being lightly pinched and let go (poor skin turgor).  Changes in body fluids, such as: ? Feeling very thirsty. ? Your eyes making fewer tears. ? Not sweating when body temperature is high, such as in hot weather. ? Your body making very little pee.  Changes in vital signs, such as: ? Weak pulse. ? Pulse that is more than 100 beats a minute when you are sitting still. ? Fast breathing. ? Low blood pressure.  Other changes, such as: ? Sunken eyes. ? Cold hands and feet. ? Confusion. ? Lack of energy (lethargy). ? Trouble waking up from sleep. ? Short-term weight loss. ? Unconsciousness. Follow these instructions at home:   If told by your doctor, drink an ORS: ? Make an ORS by using instructions on the package. ? Start by drinking small amounts, about  cup (120 mL) every  5-10 minutes. ? Slowly drink more until you have had the amount that your doctor said to have.  Drink enough clear fluid to keep your pee clear or pale yellow. If you were told to drink an ORS, finish the ORS first, then start slowly drinking clear fluids. Drink fluids such as: ? Water. Do not drink only water by itself. Doing that can make the salt (sodium) level in your body get too low (hyponatremia). ? Ice chips. ? Fruit juice that you have added water to (diluted). ? Low-calorie sports drinks.  Avoid: ? Alcohol. ? Drinks that have a lot of sugar. These include high-calorie sports drinks, fruit juice that does not have water added, and soda. ? Caffeine. ? Foods that are greasy or have a lot of fat or sugar.  Take over-the-counter and prescription medicines only as told by your doctor.  Do not take salt tablets. Doing that can make the salt level in your body get too high (hypernatremia).  Eat foods that have minerals (electrolytes). Examples include bananas, oranges, potatoes, tomatoes, and spinach.  Keep all follow-up  visits as told by your doctor. This is important. Contact a doctor if:  You have belly (abdominal) pain that: ? Gets worse. ? Stays in one area (localizes).  You have a rash.  You have a stiff neck.  You get angry or annoyed more easily than normal (irritability).  You are more sleepy than normal.  You have a harder time waking up than normal.  You feel: ? Weak. ? Dizzy. ? Very thirsty.  You have peed (urinated) only a small amount of very dark pee during 6-8 hours. Get help right away if:  You have symptoms of very bad dehydration.  You cannot drink fluids without throwing up (vomiting).  Your symptoms get worse with treatment.  You have a fever.  You have a very bad headache.  You are throwing up or having watery poop (diarrhea) and it: ? Gets worse. ? Does not go away.  You have blood or something green (bile) in your throw-up.  You  have blood in your poop (stool). This may cause poop to look black and tarry.  You have not peed in 6-8 hours.  You pass out (faint).  Your heart rate when you are sitting still is more than 100 beats a minute.  You have trouble breathing. This information is not intended to replace advice given to you by your health care provider. Make sure you discuss any questions you have with your health care provider. Document Released: 07/06/2009 Document Revised: 08/22/2017 Document Reviewed: 11/03/2015 Elsevier Patient Education  2020 [employer].

## 2019-07-13 LAB — URINE CULTURE: Culture: <10000 — AB

## 2019-07-15 ENCOUNTER — Other Ambulatory Visit: Payer: Self-pay

## 2019-07-16 ENCOUNTER — Encounter: Payer: Self-pay | Admitting: Oncology

## 2019-07-16 ENCOUNTER — Inpatient Hospital Stay (HOSPITAL_BASED_OUTPATIENT_CLINIC_OR_DEPARTMENT_OTHER): Payer: Medicare Other | Admitting: Oncology

## 2019-07-16 ENCOUNTER — Other Ambulatory Visit: Payer: Self-pay

## 2019-07-16 ENCOUNTER — Inpatient Hospital Stay: Payer: Medicare Other

## 2019-07-16 VITALS — BP 129/65 | HR 93 | Temp 97.4°F | Resp 18 | Wt 104.6 lb

## 2019-07-16 DIAGNOSIS — G4700 Insomnia, unspecified: Secondary | ICD-10-CM

## 2019-07-16 DIAGNOSIS — E860 Dehydration: Secondary | ICD-10-CM | POA: Diagnosis not present

## 2019-07-16 DIAGNOSIS — E871 Hypo-osmolality and hyponatremia: Secondary | ICD-10-CM

## 2019-07-16 DIAGNOSIS — C8599 Non-Hodgkin lymphoma, unspecified, extranodal and solid organ sites: Secondary | ICD-10-CM

## 2019-07-16 DIAGNOSIS — Z5111 Encounter for antineoplastic chemotherapy: Secondary | ICD-10-CM | POA: Diagnosis not present

## 2019-07-16 LAB — CBC WITH DIFFERENTIAL/PLATELET
Abs Immature Granulocytes: 0.73 10*3/uL — ABNORMAL HIGH (ref 0.00–0.07)
Basophils Absolute: 0.1 10*3/uL (ref 0.0–0.1)
Basophils Relative: 1 %
Eosinophils Absolute: 0.1 10*3/uL (ref 0.0–0.5)
Eosinophils Relative: 1 %
HCT: 38.3 % (ref 36.0–46.0)
Hemoglobin: 12.7 g/dL (ref 12.0–15.0)
Immature Granulocytes: 6 %
Lymphocytes Relative: 10 %
Lymphs Abs: 1.2 10*3/uL (ref 0.7–4.0)
MCH: 30 pg (ref 26.0–34.0)
MCHC: 33.2 g/dL (ref 30.0–36.0)
MCV: 90.3 fL (ref 80.0–100.0)
Monocytes Absolute: 0.8 10*3/uL (ref 0.1–1.0)
Monocytes Relative: 7 %
Neutro Abs: 9.2 10*3/uL — ABNORMAL HIGH (ref 1.7–7.7)
Neutrophils Relative %: 75 %
Platelets: 267 10*3/uL (ref 150–400)
RBC: 4.24 MIL/uL (ref 3.87–5.11)
RDW: 13.5 % (ref 11.5–15.5)
WBC: 12.1 10*3/uL — ABNORMAL HIGH (ref 4.0–10.5)
nRBC: 0 % (ref 0.0–0.2)

## 2019-07-16 LAB — BASIC METABOLIC PANEL
Anion gap: 9 (ref 5–15)
BUN: 15 mg/dL (ref 8–23)
CO2: 26 mmol/L (ref 22–32)
Calcium: 9.5 mg/dL (ref 8.9–10.3)
Chloride: 96 mmol/L — ABNORMAL LOW (ref 98–111)
Creatinine, Ser: 0.83 mg/dL (ref 0.44–1.00)
GFR calc Af Amer: >60 mL/min (ref >60)
GFR calc non Af Amer: >60 mL/min (ref >60)
Glucose, Bld: 109 mg/dL — ABNORMAL HIGH (ref 70–99)
Potassium: 4.2 mmol/L (ref 3.5–5.1)
Sodium: 131 mmol/L — ABNORMAL LOW (ref 135–145)

## 2019-07-16 LAB — SODIUM, URINE, RANDOM: Sodium, Ur: 79 mmol/L

## 2019-07-16 LAB — URIC ACID: Uric Acid, Serum: 5.6 mg/dL (ref 2.5–7.1)

## 2019-07-16 LAB — OSMOLALITY, URINE: Osmolality, Ur: 284 mOsm/kg — ABNORMAL LOW (ref 300–900)

## 2019-07-16 MED ORDER — SODIUM CHLORIDE 0.9% FLUSH
10.0000 mL | INTRAVENOUS | Status: DC | PRN
  Administered 2019-07-16: 10 mL via INTRAVENOUS
  Filled 2019-07-16: qty 10

## 2019-07-16 MED ORDER — SODIUM CHLORIDE 0.9 % IV SOLN
Freq: Once | INTRAVENOUS | Status: AC
  Administered 2019-07-16: 10:00:00 via INTRAVENOUS
  Filled 2019-07-16: qty 250

## 2019-07-16 MED ORDER — HEPARIN SOD (PORK) LOCK FLUSH 100 UNIT/ML IV SOLN
500.0000 [IU] | Freq: Once | INTRAVENOUS | Status: AC
  Administered 2019-07-16: 500 [IU] via INTRAVENOUS

## 2019-07-16 NOTE — Progress Notes (Signed)
Symptom Management Consult note [facility]  [phone] [phone]  Patient Care Team: Derinda Late, MD as PCP - General (Family Medicine)   Name of the patient: Stephanie Davidson  [id number]  05/11/1931   Date of visit: 07/16/2019   Diagnosis- Lymphoma  Chief complaint/ Reason for visit- Follow-up  Heme/Onc history:  Oncology History Overview Note  # AUG 2020- LEFT BREAST DLBCL [non-GCB subtype; Double expressor;NEG- CD-30; Ki-67-80-90%]; FISH -negative for translocations; SEP 2020- PET-left breast intense uptake; no distant disease noted.  Hold off bone marrow biopsy [patient/family preference not to be aggressive]; Stage adjusted IPI-"2 points" cure rate 50-60%.   # 9/21- Pred+Rituxan;  -------------------------------------------------------------------------------------------- # 2006- DLBCL of Breast [s? 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    ;GOALS: Cure  CURRENT/MOST RECENT THERAPY:Rituxan-+Pred    Lymphoma of breast ([place])  05/25/2019 Initial Diagnosis   Lymphoma of breast ([place])   06/14/2019 -  Chemotherapy   The patient had DOXOrubicin (ADRIAMYCIN) chemo injection 36 mg, 25 mg/m2 = 36 mg (100 % of original dose 25 mg/m2), Intravenous,  Once, 1 of 3 cycles Dose modification: 25 mg/m2 (original dose 25 mg/m2, Cycle 2, Reason: Provider Judgment) Administration: 36 mg (07/05/2019) palonosetron (ALOXI) injection 0.25 mg, 0.25 mg, Intravenous,  Once, 1 of 3 cycles Administration: 0.25 mg (07/05/2019) pegfilgrastim-jmdb (FULPHILA) injection 6 mg, 6 mg, Subcutaneous,  Once, 1 of 3 cycles Administration: 6 mg (07/06/2019) vinCRIStine (ONCOVIN) 2 mg in sodium chloride 0.9 % 50 mL chemo infusion, 2 mg, Intravenous,  Once, 1 of 3 cycles Administration: 2 mg (07/05/2019) riTUXimab (RITUXAN) 500 mg in sodium chloride 0.9 % 250 mL (1.6667 mg/mL) infusion, 375  mg/m2 = 500 mg, Intravenous,  Once, 1 of 1 cycle Administration: 500 mg (06/14/2019) cyclophosphamide (CYTOXAN) 580 mg in sodium chloride 0.9 % 250 mL chemo infusion, 400 mg/m2 = 580 mg (100 % of original dose 400 mg/m2), Intravenous,  Once, 1 of 3 cycles Dose modification: 600 mg/m2 (original dose 400 mg/m2, Cycle 2, Reason: Provider Judgment), 400 mg/m2 (original dose 400 mg/m2, Cycle 2, Reason: Provider Judgment) Administration: 580 mg (07/05/2019) fosaprepitant (EMEND) 150 mg, dexamethasone (DECADRON) 12 mg in sodium chloride 0.9 % 145 mL IVPB, , Intravenous,  Once, 1 of 3 cycles Administration:  (07/05/2019) riTUXimab-pvvr (RUXIENCE) 500 mg in sodium chloride 0.9 % 250 mL (1.6667 mg/mL) infusion, 375 mg/m2 = 500 mg (100 % of original dose 375 mg/m2), Intravenous,  Once, 1 of 1 cycle Dose modification: 375 mg/m2 (original dose 375 mg/m2, Cycle 2, Reason: Other (see comments), Comment: insurance)  for chemotherapy treatment.     Interval history-patient presents to symptom management for follow-up.  She was recently evaluated on Monday for back pain and fatigue.  Lab work revealed hyponatremia with a sodium level of 129.  She was given 1 L of normal saline while in clinic.  We checked a urinalysis which was negative for UTI.  She was instructed to begin melatonin 1 to 2 mg at bedtime or could try Benadryl.  Also instructed to begin Tylenol or ibuprofen for her back pain.  In the interim, she reports still feeling fatigued.  States she is still not sleeping well.  She is unsure if she is taking melatonin.  When she got home, she was uncertain of what she was allowed to take.  States she went to bed last night at 830 and woke up  around 3 AM.  She does not complain of back pain today.  She has significant dementia.  Typically, patients son or daughter is available by phone.  I was unable to reach anyone today.  She reports good bowel movements and appetite is okay.  She eats 1 good meal per day followed  by snacks.  She denies any abdominal pain, constipation or diarrhea.  She continues to walk half a mile daily.  She recently had chemotherapy for left breast lymphoma.  Current regimen is mini R-CHOP.  First cycle was Rituxan only.  She tolerated well.  She received cycle 1 mini R-CHOP last week with growth factor support.  ECOG FS:1 - Symptomatic but completely ambulatory  Review of systems- Review of Systems  Constitutional: Positive for malaise/fatigue.  Neurological: Positive for weakness.  Psychiatric/Behavioral: The patient has insomnia.      Current treatment- s/p 1 cycle Rituxin only to access tolerance followed by Mini-CHOP given last week (07/05/19).  No Known Allergies   Past Medical History:  Diagnosis Date  . Breast cancer ([place])   . Hypertension   . Personal history of chemotherapy   . Personal history of radiation therapy      Past Surgical History:  Procedure Laterality Date  . BREAST BIOPSY Right ?   needle bx-benign  . BREAST BIOPSY Left 05/12/2019   [place] bx 12:00, venus marker, path pending  . BREAST EXCISIONAL BIOPSY Left 2003   lymphoma  . BREAST EXCISIONAL BIOPSY Left ?   lump was removed, benign  . IR IMAGING GUIDED PORT INSERTION  06/02/2019    Social History   Socioeconomic History  . Marital status: Single    Spouse name: Not on file  . Number of children: 2  . Years of education: Not on file  . Highest education level: Not on file  Occupational History  . Not on file  Social Needs  . Financial resource strain: Not hard at all  . Food insecurity    Worry: Never true    Inability: Never true  . Transportation needs    Medical: No    Non-medical: No  Tobacco Use  . Smoking status: Never Smoker  . Smokeless tobacco: Never Used  Substance and Sexual Activity  . Alcohol use: No  . Drug use: Not on file  . Sexual activity: Not on file  Lifestyle  . Physical activity    Days per week: 7 days    Minutes per session: 30 min  . Stress:  Only a little  Relationships  . Social connections    Talks on phone: More than three times a week    Gets together: More than three times a week    Attends religious service: Not on file    Active member of club or organization: No    Attends meetings of clubs or organizations: Never    Relationship status: Not on file  . Intimate partner violence    Fear of current or ex partner: No    Emotionally abused: No    Physically abused: No    Forced sexual activity: No  Other Topics Concern  . Not on file  Social History Narrative   [place]; own apartment; no smoking; no alcohol. 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     Family History  Problem Relation Age of Onset  . Breast cancer Neg Hx      Current Outpatient Medications:  .  acetaminophen (TYLENOL) 500 MG tablet, Take 500 mg by mouth every 6 (  six) hours as needed., Disp: , Rfl:  .  amLODipine (NORVASC) 10 MG tablet, Take 1 tablet by mouth daily., Disp: , Rfl:  .  Calcium Carbonate-Vitamin D (CALTRATE 600+D PO), Take 1 tablet by mouth 2 (two) times daily., Disp: , Rfl:  .  docusate calcium (SURFAK) 240 MG capsule, Take 1 capsule by mouth 2 (two) times daily., Disp: , Rfl:  .  donepezil (ARICEPT) 10 MG tablet, Take 1 tablet by mouth at bedtime., Disp: , Rfl:  .  fluticasone (FLONASE) 50 MCG/ACT nasal spray, Place 2 sprays into both nostrils daily., Disp: , Rfl:  .  hydrochlorothiazide (HYDRODIURIL) 25 MG tablet, Take 1 tablet by mouth daily., Disp: , Rfl:  .  irbesartan (AVAPRO) 300 MG tablet, Take 1 tablet by mouth daily., Disp: , Rfl:  .  lidocaine-prilocaine (EMLA) cream, Apply to affected area once, Disp: 30 g, Rfl: 3 .  meloxicam (MOBIC) 15 MG tablet, Take 1 tablet by mouth as needed., Disp: , Rfl:  .  Multiple Vitamin (MULTI-VITAMIN) tablet, Take 1 tablet by mouth daily., Disp: , Rfl:  .  Omega-3 Fatty Acids (FISH OIL) 1000 MG CAPS, Take 1 tablet by mouth daily., Disp: , Rfl:  .  omeprazole (PRILOSEC) 20 MG capsule, Take 1 capsule by mouth  daily., Disp: , Rfl:  .  traZODone (DESYREL) 50 MG tablet, Take 1 tablet by mouth 2 (two) times daily at 8 am and 10 pm., Disp: , Rfl:  .  ondansetron (ZOFRAN) 8 MG tablet, Take 1 tablet (8 mg total) by mouth 2 (two) times daily as needed for refractory nausea / vomiting. (Patient not taking: Reported on 06/21/2019), Disp: 30 tablet, Rfl: 1 .  predniSONE (DELTASONE) 20 MG tablet, Take 3 tablets (60 mg total) by mouth daily with breakfast. Take on days 1-5 of chemotherapy. (Patient not taking: Reported on 07/02/2019), Disp: 60 tablet, Rfl: 1 .  prochlorperazine (COMPAZINE) 10 MG tablet, Take 1 tablet (10 mg total) by mouth every 6 (six) hours as needed (Nausea or vomiting). (Patient not taking: Reported on 06/21/2019), Disp: 30 tablet, Rfl: 6 No current facility-administered medications for this visit.   Facility-Administered Medications Ordered in Other Visits:  .  heparin lock flush 100 unit/mL, 500 Units, Intravenous, Once, Burns, Jennifer E, NP .  heparin lock flush 100 unit/mL, 500 Units, Intravenous, Once, Burns, Anderson Malta E, NP .  sodium chloride flush (NS) 0.9 % injection 10 mL, 10 mL, Intravenous, PRN, Burns, Jennifer E, NP .  sodium chloride flush (NS) 0.9 % injection 10 mL, 10 mL, Intravenous, PRN, Faythe Casa E, NP, 10 mL at 07/16/19 1000  Physical exam:  Vitals:   07/16/19 0937  BP: 129/65  Pulse: 93  Resp: 18  Temp: (!) 97.4 F (36.3 C)  TempSrc: Tympanic  Weight: 104 lb 9.6 oz (47.4 kg)   Physical Exam Constitutional:      Appearance: Normal appearance.  HENT:     Head: Normocephalic and atraumatic.  Eyes:     Pupils: Pupils are equal, round, and reactive to light.  Neck:     Musculoskeletal: Normal range of motion.  Cardiovascular:     Rate and Rhythm: Normal rate and regular rhythm.     Heart sounds: Normal heart sounds. No murmur.  Pulmonary:     Effort: Pulmonary effort is normal.     Breath sounds: Normal breath sounds. No wheezing.  Abdominal:     General:  Bowel sounds are normal. There is no distension.     Palpations:  Abdomen is soft.     Tenderness: There is no abdominal tenderness.  Musculoskeletal: Normal range of motion.  Skin:    General: Skin is warm and dry.     Findings: No rash.  Neurological:     Mental Status: She is alert and oriented to person, place, and time.  Psychiatric:        Judgment: Judgment normal.      CMP Latest Ref Rng & Units 07/16/2019  Glucose 70 - 99 mg/dL 109(H)  BUN 8 - 23 mg/dL 15  Creatinine 0.44 - 1.00 mg/dL 0.83  Sodium 135 - 145 mmol/L 131(L)  Potassium 3.5 - 5.1 mmol/L 4.2  Chloride 98 - 111 mmol/L 96(L)  CO2 22 - 32 mmol/L 26  Calcium 8.9 - 10.3 mg/dL 9.5  Total Protein 6.5 - 8.1 g/dL -  Total Bilirubin 0.3 - 1.2 mg/dL -  Alkaline Phos 38 - 126 U/L -  AST 15 - 41 U/L -  ALT 0 - 44 U/L -   CBC Latest Ref Rng & Units 07/16/2019  WBC 4.0 - 10.5 K/uL 12.1(H)  Hemoglobin 12.0 - 15.0 g/dL 12.7  Hematocrit 36.0 - 46.0 % 38.3  Platelets 150 - 400 K/uL 267    No images are attached to the encounter.  No results found.   Assessment and plan- Patient is a 85 y.o. female who presents to symptom management for follow-up for insomnia, fatigue and thoracic midline back pain.  Diffuse large B-cell lymphoma of left breast: Stage I.  Status post 1 cycle of Rituxan and 1 cycle of mini R-CHOP.  Appeared to tolerate fairly well.  Scheduled to return for cycle 2 of mini R-CHOP on 07/26/2019.  Insomnia: Unclear of how long this is been going on.  Instructed to begin 1 to 2 mg melatonin at bedtime.  Patient cannot remember if she has started taking this.  States she continues to not sleep but 4 to 5 hours nightly.  She is taking trazodone twice daily for dementia.  Back pain: Does not complain of that today.  I am wondering if some of her myalgias  and insomnia is not related to treatment from previous week including 50 mg of prednisone (D1-5) and Neulasta.  Leukocytosis: White count today slightly up at  12.1.  Likely due to Neulasta given last week.  Continue to monitor.  No signs of infection.  Afebrile.  Hyponatremia: Sodium level has improved today and is 131.  Vital signs remained stable.  All other electrolytes are okay.  We will give additional 1 L of normal saline in clinic today.  This is unlikely SIADH given it improved after receiving IV fluids.  We spoke at length about hydration.  SIADH should be suspected patients with hyponatremia, hypoosmolality and a urine osmolality greater than 100 mosmol/kg.  Urine sodium concentration is usually above 40 mEq/L, serum potassium concentration is normal and there is no acid-base disturbance. Serum uric acid concentration is low.   Plan:  Stat lab work SIADH work-up.  (Uric acid 5.6, sodium 131, potassium 4.2) additional labs pending. Give 1 L normal saline. Take 1 to 2 mg of melatonin at bedtime.  She also can try Benadryl 25 mg at bedtime.   Spoke at length with patient's daughter.  Patient's children live out of town.  [facility].  Unclear if Ms. Nevin was actually taking 7m  melatonin to help with sleep.  Per daughter, she will have it added  to her nighttime medicines by [facility].  We will see if this helps.  Disposition: RTC as scheduled on 07/26/19 for md assessment, labs and next cycle of Mini RCHOP.   Visit Diagnosis 1. Hyponatremia   2. Dehydration   3. Insomnia, unspecified type     Patient expressed understanding and was in agreement with this plan. She also understands that She can call clinic at any time with any questions, concerns, or complaints.   Greater than 50% was spent in counseling and coordination of care with this patient including but not limited to discussion of the relevant topics above (See A&P) including, but not limited to diagnosis and management of acute and chronic medical conditions.   Thank you for allowing me to participate in the care  of this very pleasant patient.    Jacquelin Hawking, NP [place] [facility] Cell - [phone] Pager- [phone] 07/16/2019 12:34 PM

## 2019-07-23 ENCOUNTER — Other Ambulatory Visit: Payer: Self-pay

## 2019-07-23 ENCOUNTER — Telehealth: Payer: Self-pay | Admitting: *Deleted

## 2019-07-23 NOTE — Progress Notes (Signed)
Patient pre screened for office appointment, no questions or concerns today. 

## 2019-07-23 NOTE — Telephone Encounter (Signed)
Spoke with patient's daughter. She would prefer that Dr. Tasia Catchings see the patient on Monday for her tx instead of a virtual visit with Dr. Jacinto Reap. She understands that Dr. Jacinto Reap is willing to do a facetime virtual visit with her mom.  She already told her mom that she would be seeing Dr. Tasia Catchings and doesn't want to confuse her mom any further.  Dr. B -  Pt recently dx with pressure wound on buttock - see careverywhere.

## 2019-07-26 ENCOUNTER — Encounter: Payer: Self-pay | Admitting: Oncology

## 2019-07-26 ENCOUNTER — Inpatient Hospital Stay: Payer: Medicare Other | Attending: Oncology | Admitting: *Deleted

## 2019-07-26 ENCOUNTER — Inpatient Hospital Stay (HOSPITAL_BASED_OUTPATIENT_CLINIC_OR_DEPARTMENT_OTHER): Payer: Medicare Other | Admitting: Hospice and Palliative Medicine

## 2019-07-26 ENCOUNTER — Inpatient Hospital Stay: Payer: Medicare Other

## 2019-07-26 ENCOUNTER — Inpatient Hospital Stay (HOSPITAL_BASED_OUTPATIENT_CLINIC_OR_DEPARTMENT_OTHER): Payer: Medicare Other | Admitting: Oncology

## 2019-07-26 ENCOUNTER — Other Ambulatory Visit: Payer: Self-pay

## 2019-07-26 VITALS — BP 149/79 | HR 85 | Temp 97.9°F | Resp 16 | Wt 103.6 lb

## 2019-07-26 DIAGNOSIS — D649 Anemia, unspecified: Secondary | ICD-10-CM | POA: Insufficient documentation

## 2019-07-26 DIAGNOSIS — Z515 Encounter for palliative care: Secondary | ICD-10-CM | POA: Diagnosis not present

## 2019-07-26 DIAGNOSIS — Z95828 Presence of other vascular implants and grafts: Secondary | ICD-10-CM

## 2019-07-26 DIAGNOSIS — C8330 Diffuse large B-cell lymphoma, unspecified site: Secondary | ICD-10-CM | POA: Insufficient documentation

## 2019-07-26 DIAGNOSIS — G4700 Insomnia, unspecified: Secondary | ICD-10-CM | POA: Insufficient documentation

## 2019-07-26 DIAGNOSIS — C8599 Non-Hodgkin lymphoma, unspecified, extranodal and solid organ sites: Secondary | ICD-10-CM

## 2019-07-26 DIAGNOSIS — K5900 Constipation, unspecified: Secondary | ICD-10-CM | POA: Insufficient documentation

## 2019-07-26 DIAGNOSIS — Z5111 Encounter for antineoplastic chemotherapy: Secondary | ICD-10-CM | POA: Insufficient documentation

## 2019-07-26 DIAGNOSIS — M549 Dorsalgia, unspecified: Secondary | ICD-10-CM | POA: Diagnosis not present

## 2019-07-26 DIAGNOSIS — F0390 Unspecified dementia without behavioral disturbance: Secondary | ICD-10-CM | POA: Diagnosis not present

## 2019-07-26 DIAGNOSIS — Z79899 Other long term (current) drug therapy: Secondary | ICD-10-CM | POA: Diagnosis not present

## 2019-07-26 DIAGNOSIS — E871 Hypo-osmolality and hyponatremia: Secondary | ICD-10-CM | POA: Diagnosis not present

## 2019-07-26 LAB — COMPREHENSIVE METABOLIC PANEL
ALT: 15 U/L (ref 0–44)
AST: 19 U/L (ref 15–41)
Albumin: 3.9 g/dL (ref 3.5–5.0)
Alkaline Phosphatase: 106 U/L (ref 38–126)
Anion gap: 9 (ref 5–15)
BUN: 15 mg/dL (ref 8–23)
CO2: 24 mmol/L (ref 22–32)
Calcium: 9.4 mg/dL (ref 8.9–10.3)
Chloride: 102 mmol/L (ref 98–111)
Creatinine, Ser: 0.71 mg/dL (ref 0.44–1.00)
GFR calc Af Amer: >60 mL/min (ref >60)
GFR calc non Af Amer: >60 mL/min (ref >60)
Glucose, Bld: 96 mg/dL (ref 70–99)
Potassium: 3.9 mmol/L (ref 3.5–5.1)
Sodium: 135 mmol/L (ref 135–145)
Total Bilirubin: 0.4 mg/dL (ref 0.3–1.2)
Total Protein: 6.7 g/dL (ref 6.5–8.1)

## 2019-07-26 LAB — CBC WITH DIFFERENTIAL/PLATELET
Abs Immature Granulocytes: 0.02 10*3/uL (ref 0.00–0.07)
Basophils Absolute: 0.1 10*3/uL (ref 0.0–0.1)
Basophils Relative: 1 %
Eosinophils Absolute: 0.2 10*3/uL (ref 0.0–0.5)
Eosinophils Relative: 3 %
HCT: 35.3 % — ABNORMAL LOW (ref 36.0–46.0)
Hemoglobin: 11.6 g/dL — ABNORMAL LOW (ref 12.0–15.0)
Immature Granulocytes: 0 %
Lymphocytes Relative: 13 %
Lymphs Abs: 1 10*3/uL (ref 0.7–4.0)
MCH: 29.8 pg (ref 26.0–34.0)
MCHC: 32.9 g/dL (ref 30.0–36.0)
MCV: 90.7 fL (ref 80.0–100.0)
Monocytes Absolute: 0.8 10*3/uL (ref 0.1–1.0)
Monocytes Relative: 10 %
Neutro Abs: 5.5 10*3/uL (ref 1.7–7.7)
Neutrophils Relative %: 73 %
Platelets: 354 10*3/uL (ref 150–400)
RBC: 3.89 MIL/uL (ref 3.87–5.11)
RDW: 13.6 % (ref 11.5–15.5)
WBC: 7.6 10*3/uL (ref 4.0–10.5)
nRBC: 0 % (ref 0.0–0.2)

## 2019-07-26 LAB — LACTATE DEHYDROGENASE: LDH: 199 U/L — ABNORMAL HIGH (ref 98–192)

## 2019-07-26 MED ORDER — VINCRISTINE SULFATE CHEMO INJECTION 1 MG/ML
2.0000 mg | Freq: Once | INTRAVENOUS | Status: AC
  Administered 2019-07-26: 2 mg via INTRAVENOUS
  Filled 2019-07-26: qty 2

## 2019-07-26 MED ORDER — SODIUM CHLORIDE 0.9 % IV SOLN
Freq: Once | INTRAVENOUS | Status: AC
  Administered 2019-07-26: 09:00:00 via INTRAVENOUS
  Filled 2019-07-26: qty 250

## 2019-07-26 MED ORDER — PALONOSETRON HCL INJECTION 0.25 MG/5ML
0.2500 mg | Freq: Once | INTRAVENOUS | Status: AC
  Administered 2019-07-26: 0.25 mg via INTRAVENOUS
  Filled 2019-07-26: qty 5

## 2019-07-26 MED ORDER — SODIUM CHLORIDE 0.9 % IV SOLN
400.0000 mg/m2 | Freq: Once | INTRAVENOUS | Status: AC
  Administered 2019-07-26: 580 mg via INTRAVENOUS
  Filled 2019-07-26: qty 29

## 2019-07-26 MED ORDER — ACETAMINOPHEN 325 MG PO TABS
650.0000 mg | ORAL_TABLET | Freq: Once | ORAL | Status: AC
  Administered 2019-07-26: 650 mg via ORAL
  Filled 2019-07-26: qty 2

## 2019-07-26 MED ORDER — HEPARIN SOD (PORK) LOCK FLUSH 100 UNIT/ML IV SOLN
500.0000 [IU] | Freq: Once | INTRAVENOUS | Status: AC | PRN
  Administered 2019-07-26: 500 [IU]
  Filled 2019-07-26: qty 5

## 2019-07-26 MED ORDER — SODIUM CHLORIDE 0.9% FLUSH
10.0000 mL | Freq: Once | INTRAVENOUS | Status: AC
  Administered 2019-07-26: 10 mL via INTRAVENOUS
  Filled 2019-07-26: qty 10

## 2019-07-26 MED ORDER — DOXORUBICIN HCL CHEMO IV INJECTION 2 MG/ML
25.0000 mg/m2 | Freq: Once | INTRAVENOUS | Status: AC
  Administered 2019-07-26: 36 mg via INTRAVENOUS
  Filled 2019-07-26: qty 18

## 2019-07-26 MED ORDER — DIPHENHYDRAMINE HCL 25 MG PO CAPS
50.0000 mg | ORAL_CAPSULE | Freq: Once | ORAL | Status: AC
  Administered 2019-07-26: 50 mg via ORAL
  Filled 2019-07-26: qty 2

## 2019-07-26 MED ORDER — SODIUM CHLORIDE 0.9 % IV SOLN
Freq: Once | INTRAVENOUS | Status: AC
  Administered 2019-07-26: 10:00:00 via INTRAVENOUS
  Filled 2019-07-26: qty 5

## 2019-07-26 MED ORDER — SODIUM CHLORIDE 0.9 % IV SOLN
375.0000 mg/m2 | Freq: Once | INTRAVENOUS | Status: AC
  Administered 2019-07-26: 500 mg via INTRAVENOUS
  Filled 2019-07-26: qty 50

## 2019-07-26 NOTE — Progress Notes (Signed)
Patient does not offer any problems today.  

## 2019-07-26 NOTE — Progress Notes (Signed)
[place]  [phone] [phone]   Name: Stephanie Davidson Date: 07/26/2019 MRN: [id number]  DOB: 06-17-1931  Patient Care Team: Stephanie Late, MD as PCP - General (Family Medicine)    REASON FOR CONSULTATION: Palliative Care consult requested for this 85 y.o. 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  Patient had severe symptoms from previous treatment and was initially reluctant to pursue further cancer treatment.  Patient was referred to palliative care to help address goals and provide with support.   SOCIAL HISTORY:     reports that she has never smoked. She has never used smokeless tobacco. She reports that she does not drink alcohol.   Patient is widowed.  She lives at [facility] in independent living.  She has a son and [place] and a daughter in [facility] [place].  ADVANCE DIRECTIVES:  Not on file.  Her son, Stephanie Davidson, is her healthcare power of attorney and her daughter, Stephanie Davidson, is her financial POA.  Patient reportedly has a living will.  CODE STATUS:   PAST MEDICAL HISTORY: Past Medical History:  Diagnosis Date  . Breast cancer ([place])   . Hypertension   . Personal history of chemotherapy   . Personal history of radiation therapy     PAST SURGICAL HISTORY:  Past Surgical History:  Procedure Laterality Date  . BREAST BIOPSY Right ?   needle bx-benign  . BREAST BIOPSY Left 05/12/2019   [place] bx 12:00, venus marker, path pending  . BREAST EXCISIONAL BIOPSY Left 2003   lymphoma  . BREAST EXCISIONAL BIOPSY Left ?   lump was removed, benign  . IR IMAGING GUIDED PORT INSERTION  06/02/2019    HEMATOLOGY/ONCOLOGY HISTORY:  Oncology History Overview Note  # AUG 2020- LEFT BREAST DLBCL [non-GCB subtype; Double expressor;NEG- CD-30; Ki-67-80-90%]; FISH -negative for translocations; SEP  2020- PET-left breast intense uptake; no distant disease noted.  Hold off bone marrow biopsy [patient/family preference not to be aggressive]; Stage adjusted IPI-"2 points" cure rate 50-60%.   # 9/21- Pred+Rituxan;  -------------------------------------------------------------------------------------------- # 2006- DLBCL of Breast [s? 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    ;GOALS: Cure  CURRENT/MOST RECENT THERAPY:Rituxan-+Pred    Lymphoma of breast ([place])  05/25/2019 Initial Diagnosis   Lymphoma of breast ([place])   06/14/2019 -  Chemotherapy   The patient had DOXOrubicin (ADRIAMYCIN) chemo injection 36 mg, 25 mg/m2 = 36 mg (100 % of original dose 25 mg/m2), Intravenous,  Once, 2 of 3 cycles Dose modification: 25 mg/m2 (original dose 25 mg/m2, Cycle 2, Reason: Provider Judgment) Administration: 36 mg (07/05/2019) palonosetron (ALOXI) injection 0.25 mg, 0.25 mg, Intravenous,  Once, 2 of 3 cycles Administration: 0.25 mg (07/05/2019) pegfilgrastim-jmdb (FULPHILA) injection 6 mg, 6 mg, Subcutaneous,  Once, 2 of 3 cycles Administration: 6 mg (07/06/2019) vinCRIStine (ONCOVIN) 2 mg in sodium chloride 0.9 % 50 mL chemo infusion, 2 mg, Intravenous,  Once, 2 of 3 cycles Administration: 2 mg (07/05/2019) riTUXimab (RITUXAN) 500 mg in sodium chloride 0.9 % 250 mL (1.6667 mg/mL) infusion, 375 mg/m2 = 500 mg, Intravenous,  Once, 1 of 1 cycle Administration: 500 mg (06/14/2019) cyclophosphamide (CYTOXAN) 580 mg in sodium chloride 0.9 % 250 mL chemo infusion, 400 mg/m2 = 580 mg (100 % of original dose 400 mg/m2), Intravenous,  Once, 2  of 3 cycles Dose modification: 600 mg/m2 (original dose 400 mg/m2, Cycle 2, Reason: Provider Judgment), 400 mg/m2 (original dose 400 mg/m2, Cycle 2, Reason: Provider Judgment) Administration: 580 mg (07/05/2019) fosaprepitant (EMEND) 150 mg, dexamethasone (DECADRON) 12 mg in sodium chloride 0.9 %  145 mL IVPB, , Intravenous,  Once, 2 of 3 cycles Administration:  (07/05/2019) riTUXimab-pvvr (RUXIENCE) 500 mg in sodium chloride 0.9 % 250 mL (1.6667 mg/mL) infusion, 375 mg/m2 = 500 mg (100 % of original dose 375 mg/m2), Intravenous,  Once, 1 of 1 cycle Dose modification: 375 mg/m2 (original dose 375 mg/m2, Cycle 2, Reason: Other (see comments), Comment: insurance)  for chemotherapy treatment.      ALLERGIES:  has No Known Allergies.  MEDICATIONS:  Current Outpatient Medications  Medication Sig Dispense Refill  . acetaminophen (TYLENOL) 500 MG tablet Take 500 mg by mouth every 6 (six) hours as needed.    Marland Kitchen amLODipine (NORVASC) 10 MG tablet Take 1 tablet by mouth daily.    . Calcium Carbonate-Vitamin D (CALTRATE 600+D PO) Take 1 tablet by mouth 2 (two) times daily.    Marland Kitchen docusate calcium (SURFAK) 240 MG capsule Take 1 capsule by mouth 2 (two) times daily.    Marland Kitchen donepezil (ARICEPT) 10 MG tablet Take 1 tablet by mouth at bedtime.    . fluticasone (FLONASE) 50 MCG/ACT nasal spray Place 2 sprays into both nostrils daily.    . hydrochlorothiazide (HYDRODIURIL) 25 MG tablet Take 1 tablet by mouth daily.    . irbesartan (AVAPRO) 300 MG tablet Take 1 tablet by mouth daily.    Marland Kitchen lidocaine-prilocaine (EMLA) cream Apply to affected area once 30 g 3  . meloxicam (MOBIC) 15 MG tablet Take 1 tablet by mouth as needed.    . Multiple Vitamin (MULTI-VITAMIN) tablet Take 1 tablet by mouth daily.    . Omega-3 Fatty Acids (FISH OIL) 1000 MG CAPS Take 1 tablet by mouth daily.    Marland Kitchen omeprazole (PRILOSEC) 20 MG capsule Take 1 capsule by mouth daily.    . ondansetron (ZOFRAN) 8 MG tablet Take 1 tablet (8 mg total) by mouth 2 (two) times daily as needed for refractory nausea / vomiting. 30 tablet 1  . predniSONE (DELTASONE) 20 MG tablet Take 3 tablets (60 mg total) by mouth daily with breakfast. Take on days 1-5 of chemotherapy. 60 tablet 1  . prochlorperazine (COMPAZINE) 10 MG tablet Take 1 tablet (10 mg total)  by mouth every 6 (six) hours as needed (Nausea or vomiting). 30 tablet 6  . traZODone (DESYREL) 50 MG tablet Take 1 tablet by mouth 2 (two) times daily at 8 am and 10 pm.     No current facility-administered medications for this visit.    Facility-Administered Medications Ordered in Other Visits  Medication Dose Route Frequency Provider Last Rate Last Dose  . cyclophosphamide (CYTOXAN) 580 mg in sodium chloride 0.9 % 250 mL chemo infusion  400 mg/m2 (Treatment Plan Recorded) Intravenous Once Earlie Server, MD      . heparin lock flush 100 unit/mL  500 Units Intravenous Once Faythe Casa E, NP      . heparin lock flush 100 unit/mL  500 Units Intracatheter Once PRN Earlie Server, MD      . riTUXimab-pvvr (RUXIENCE) 500 mg in sodium chloride 0.9 % 200 mL infusion  375 mg/m2 (Treatment Plan Recorded) Intravenous Once Earlie Server, MD      . sodium chloride flush (NS) 0.9 % injection 10 mL  10 mL Intravenous PRN Jacquelin Hawking,  NP      . vinCRIStine (ONCOVIN) 2 mg in sodium chloride 0.9 % 50 mL chemo infusion  2 mg Intravenous Once Earlie Server, MD        VITAL SIGNS: There were no vitals taken for this visit. There were no vitals filed for this visit.  Estimated body mass index is 19.58 kg/m as calculated from the following:   Height as of 07/05/19: _0  (1.549 m).   Weight as of an earlier encounter on 07/26/19: 103 lb 9.6 oz (47 kg).  LABS: CBC:    Component Value Date/Time   WBC 7.6 07/26/2019 0835   HGB 11.6 (L) 07/26/2019 0835   HGB 14.1 10/28/2012 0950   HCT 35.3 (L) 07/26/2019 0835   HCT 41.4 10/28/2012 0950   PLT 354 07/26/2019 0835   PLT 200 10/28/2012 0950   MCV 90.7 07/26/2019 0835   MCV 92 10/28/2012 0950   NEUTROABS 5.5 07/26/2019 0835   NEUTROABS 3.8 10/28/2012 0950   LYMPHSABS 1.0 07/26/2019 0835   LYMPHSABS 1.4 10/28/2012 0950   MONOABS 0.8 07/26/2019 0835   MONOABS 0.4 10/28/2012 0950   EOSABS 0.2 07/26/2019 0835   EOSABS 0.2 10/28/2012 0950   BASOSABS 0.1 07/26/2019 0835    BASOSABS 0.0 10/28/2012 0950   Comprehensive Metabolic Panel:    Component Value Date/Time   NA 135 07/26/2019 0835   NA 139 10/28/2012 0950   K 3.9 07/26/2019 0835   K 3.4 (L) 02/18/2013 1508   CL 102 07/26/2019 0835   CL 98 10/28/2012 0950   CO2 24 07/26/2019 0835   CO2 33 (H) 10/28/2012 0950   BUN 15 07/26/2019 0835   BUN 16 10/28/2012 0950   CREATININE 0.71 07/26/2019 0835   CREATININE 0.74 10/28/2012 0950   GLUCOSE 96 07/26/2019 0835   GLUCOSE 105 (H) 10/28/2012 0950   CALCIUM 9.4 07/26/2019 0835   CALCIUM 10.0 10/28/2012 0950   AST 19 07/26/2019 0835   AST 26 10/28/2012 0950   ALT 15 07/26/2019 0835   ALT 28 10/28/2012 0950   ALKPHOS 106 07/26/2019 0835   ALKPHOS 142 (H) 10/28/2012 0950   BILITOT 0.4 07/26/2019 0835   BILITOT 0.4 10/28/2012 0950   PROT 6.7 07/26/2019 0835   PROT 8.2 10/28/2012 0950   ALBUMIN 3.9 07/26/2019 0835   ALBUMIN 4.0 10/28/2012 0950    RADIOGRAPHIC STUDIES: No results found.  PERFORMANCE STATUS (ECOG) : 0 - Asymptomatic  Review of Systems Unless otherwise noted, a complete review of systems is negative.  Physical Exam General: NAD, frail appearing, thin Pulmonary: Unlabored Extremities: no edema, no joint deformities Skin: no rashes Neurological: Weakness but otherwise nonfocal  IMPRESSION: Routine follow-up visit today.  Patient was seen in the infusion area.  Patient feels she is doing reasonably well.  She denies any acute changes or concerns.  She has no distressing symptoms today.  She continues to have chronic insomnia.  It sounds like the trazodone is providing her with about 6 hours of sleep, which patient finds tolerable.  Although patient continues to endorse a good oral intake, I note that her weight is down slightly over the past month from 108 pounds 203 pounds.  Will follow weight trends.  We will go ahead and consult our dietitian.  I called and spoke with patient's daughter, who feels patient's doing  reasonably well.  We did discuss her oral intake particularly with fluids.  I have previously sent patient home with a MOST Form.  Patient says that she  remembers the form but cannot remember she completed it.  She will ask her daughter today.  PLAN: -Continue current scope of treatment -Recommend oral supplements 3 times daily -Referral to dietitian -MOST form previously reviewed -RTC in 2-4 weeks   Patient expressed understanding and was in agreement with this plan. She also understands that She can call the clinic at any time with any questions, concerns, or complaints.     Time Total: 15 minutes  Visit consisted of counseling and education dealing with the complex and emotionally intense issues of symptom management and palliative care in the setting of serious and potentially life-threatening illness.Greater than 50%  of this time was spent counseling and coordinating care related to the above assessment and plan.  Signed by: Altha Harm, PhD, NP-C [id number] (Work Cell)

## 2019-07-26 NOTE — Progress Notes (Signed)
[facility] CONSULT NOTE  Patient Care Team: Derinda Late, MD as PCP - General (Family Medicine)  CHIEF COMPLAINTS/PURPOSE OF CONSULTATION: Breast lymphoma   Oncology History Overview Note  # AUG 2020- LEFT BREAST DLBCL [non-GCB subtype; Double expressor;NEG- CD-30; Ki-67-80-90%]; FISH -negative for translocations; SEP 2020- PET-left breast intense uptake; no distant disease noted.  Hold off bone marrow biopsy [patient/family preference not to be aggressive]; Stage adjusted IPI-"2 points" cure rate 50-60%.   # 9/21- Pred+Rituxan;  -------------------------------------------------------------------------------------------- # 2006- DLBCL of Breast [s? 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    ;GOALS: Cure  CURRENT/MOST RECENT THERAPY:Rituxan-+Pred    Lymphoma of breast ([place])  05/25/2019 Initial Diagnosis   Lymphoma of breast ([place])   06/14/2019 -  Chemotherapy   The patient had DOXOrubicin (ADRIAMYCIN) chemo injection 36 mg, 25 mg/m2 = 36 mg (100 % of original dose 25 mg/m2), Intravenous,  Once, 2 of 3 cycles Dose modification: 25 mg/m2 (original dose 25 mg/m2, Cycle 2, Reason: Provider Judgment) Administration: 36 mg (07/05/2019), 36 mg (07/26/2019) palonosetron (ALOXI) injection 0.25 mg, 0.25 mg, Intravenous,  Once, 2 of 3 cycles Administration: 0.25 mg (07/05/2019), 0.25 mg (07/26/2019) pegfilgrastim-jmdb (FULPHILA) injection 6 mg, 6 mg, Subcutaneous,  Once, 2 of 3 cycles Administration: 6 mg (07/06/2019) vinCRIStine (ONCOVIN) 2 mg in sodium chloride 0.9 % 50 mL chemo infusion, 2 mg, Intravenous,  Once, 2 of 3 cycles Administration: 2 mg (07/05/2019), 2 mg (07/26/2019) riTUXimab (RITUXAN) 500 mg in sodium chloride 0.9 % 250 mL (1.6667 mg/mL) infusion, 375 mg/m2 = 500 mg, Intravenous,  Once, 1 of 1 cycle Administration: 500 mg (06/14/2019) cyclophosphamide (CYTOXAN) 580 mg in sodium chloride  0.9 % 250 mL chemo infusion, 400 mg/m2 = 580 mg (100 % of original dose 400 mg/m2), Intravenous,  Once, 2 of 3 cycles Dose modification: 600 mg/m2 (original dose 400 mg/m2, Cycle 2, Reason: Provider Judgment), 400 mg/m2 (original dose 400 mg/m2, Cycle 2, Reason: Provider Judgment) Administration: 580 mg (07/05/2019), 580 mg (07/26/2019) fosaprepitant (EMEND) 150 mg, dexamethasone (DECADRON) 12 mg in sodium chloride 0.9 % 145 mL IVPB, , Intravenous,  Once, 2 of 3 cycles Administration:  (07/05/2019),  (07/26/2019) riTUXimab-pvvr (RUXIENCE) 500 mg in sodium chloride 0.9 % 250 mL (1.6667 mg/mL) infusion, 375 mg/m2 = 500 mg (100 % of original dose 375 mg/m2), Intravenous,  Once, 1 of 1 cycle Dose modification: 375 mg/m2 (original dose 375 mg/m2, Cycle 2, Reason: Other (see comments), Comment: insurance)  for chemotherapy treatment.       HISTORY OF PRESENTING ILLNESS:  Stephanie Davidson 85 y.o.  female diffuse large B-cell lymphoma of the left breast is here mini R-CHOP.  Patient follows up with Dr.Brahmanday, I am covering him to see this patient. Patient is s/p rituximab 6 weeks ago, 1 cycle of mini R-CHOP 3 weeks ago. She reports tolerating well with no significant toxicities. Continues to be active.  Walks every day. Denies any nausea vomiting diarrhea.  Appetite is fair.  Weight is stable. No significant improvement of his left breast mass or worsening.  Review of Systems  Constitutional: Positive for fatigue. Negative for appetite change, chills and fever.  HENT:   Negative for hearing loss and voice change.   Eyes: Negative for eye problems.  Respiratory: Negative for chest tightness and cough.   Cardiovascular: Negative for chest pain.  Gastrointestinal: Negative for abdominal distention, abdominal pain and blood in stool.  Endocrine: Negative for hot flashes.  Genitourinary: Negative for difficulty urinating and frequency.   Musculoskeletal: Positive for arthralgias and back pain.   Skin: Negative for itching and rash.  Neurological: Negative for extremity weakness.       Memory loss  Hematological: Negative for adenopathy.  Psychiatric/Behavioral: Negative for confusion.     MEDICAL HISTORY:  Past Medical History:  Diagnosis Date  . Breast cancer ([place])   . Hypertension   . Personal history of chemotherapy   . Personal history of radiation therapy     SURGICAL HISTORY: Past Surgical History:  Procedure Laterality Date  . BREAST BIOPSY Right ?   needle bx-benign  . BREAST BIOPSY Left 05/12/2019   [place] bx 12:00, venus marker, path pending  . BREAST EXCISIONAL BIOPSY Left 2003   lymphoma  . BREAST EXCISIONAL BIOPSY Left ?   lump was removed, benign  . IR IMAGING GUIDED PORT INSERTION  06/02/2019    SOCIAL HISTORY: Social History   Socioeconomic History  . Marital status: Single    Spouse name: Not on file  . Number of children: 2  . Years of education: Not on file  . Highest education level: Not on file  Occupational History  . Not on file  Social Needs  . Financial resource strain: Not hard at all  . Food insecurity    Worry: Never true    Inability: Never true  . Transportation needs    Medical: No    Non-medical: No  Tobacco Use  . Smoking status: Never Smoker  . Smokeless tobacco: Never Used  Substance and Sexual Activity  . Alcohol use: No  . Drug use: Not on file  . Sexual activity: Not on file  Lifestyle  . Physical activity    Days per week: 7 days    Minutes per session: 30 min  . Stress: Only a little  Relationships  . Social connections    Talks on phone: More than three times a week    Gets together: More than three times a week    Attends religious service: Not on file    Active member of club or organization: No    Attends meetings of clubs or organizations: Never    Relationship status: Not on file  . Intimate partner violence    Fear of current or ex partner: No    Emotionally abused: No    Physically abused: No     Forced sexual activity: No  Other Topics Concern  . Not on file  Social History Narrative   [place]; own apartment; no smoking; no alcohol. 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     FAMILY HISTORY: Family History  Problem Relation Age of Onset  . Breast cancer Neg Hx     ALLERGIES:  has No Known Allergies.  MEDICATIONS:  Current Outpatient Medications  Medication Sig Dispense Refill  . acetaminophen (TYLENOL) 500 MG tablet Take 500 mg by mouth every 6 (six) hours as needed.    Marland Kitchen amLODipine (NORVASC) 10 MG tablet Take 1 tablet by mouth daily.    . Calcium Carbonate-Vitamin D (CALTRATE 600+D PO) Take 1 tablet by mouth 2 (two) times daily.    Marland Kitchen docusate calcium (SURFAK) 240 MG capsule Take 1 capsule by mouth 2 (two) times daily.    Marland Kitchen donepezil (ARICEPT) 10 MG tablet Take 1 tablet by mouth at bedtime.    . fluticasone (FLONASE) 50 MCG/ACT nasal spray Place 2 sprays into both nostrils daily.    Marland Kitchen  hydrochlorothiazide (HYDRODIURIL) 25 MG tablet Take 1 tablet by mouth daily.    . irbesartan (AVAPRO) 300 MG tablet Take 1 tablet by mouth daily.    Marland Kitchen lidocaine-prilocaine (EMLA) cream Apply to affected area once 30 g 3  . meloxicam (MOBIC) 15 MG tablet Take 1 tablet by mouth as needed.    . Multiple Vitamin (MULTI-VITAMIN) tablet Take 1 tablet by mouth daily.    . Omega-3 Fatty Acids (FISH OIL) 1000 MG CAPS Take 1 tablet by mouth daily.    Marland Kitchen omeprazole (PRILOSEC) 20 MG capsule Take 1 capsule by mouth daily.    . ondansetron (ZOFRAN) 8 MG tablet Take 1 tablet (8 mg total) by mouth 2 (two) times daily as needed for refractory nausea / vomiting. 30 tablet 1  . predniSONE (DELTASONE) 20 MG tablet Take 3 tablets (60 mg total) by mouth daily with breakfast. Take on days 1-5 of chemotherapy. 60 tablet 1  . prochlorperazine (COMPAZINE) 10 MG tablet Take 1 tablet (10 mg total) by mouth every 6 (six) hours as needed (Nausea or vomiting). 30 tablet 6  . traZODone (DESYREL) 50 MG tablet Take 1 tablet by mouth 2 (two)  times daily at 8 am and 10 pm.     No current facility-administered medications for this visit.    Facility-Administered Medications Ordered in Other Visits  Medication Dose Route Frequency Provider Last Rate Last Dose  . heparin lock flush 100 unit/mL  500 Units Intravenous Once Faythe Casa E, NP      . sodium chloride flush (NS) 0.9 % injection 10 mL  10 mL Intravenous PRN Jacquelin Hawking, NP          .  PHYSICAL EXAMINATION: ECOG PERFORMANCE STATUS: 1 - Symptomatic but completely ambulatory  Vitals:   07/26/19 0854  BP: (!) 149/79  Pulse: 85  Resp: 16  Temp: 97.9 F (36.6 C)   Filed Weights   07/26/19 0854  Weight: 103 lb 9.6 oz (47 kg)    Physical Exam  Constitutional: She is oriented to person, place, and time and well-developed, well-nourished, and in no distress. No distress.  HENT:  Head: Normocephalic and atraumatic.  Nose: Nose normal.  Mouth/Throat: Oropharynx is clear and moist. No oropharyngeal exudate.  Eyes: Pupils are equal, round, and reactive to light. EOM are normal. No scleral icterus.  Neck: Normal range of motion. Neck supple.  Cardiovascular: Normal rate and regular rhythm.  No murmur heard. Pulmonary/Chest: Effort normal and breath sounds normal. No respiratory distress. She has no wheezes. She has no rales. She exhibits no tenderness.  Abdominal: Soft. Bowel sounds are normal. She exhibits no distension and no mass. There is no abdominal tenderness. There is no rebound and no guarding.  Musculoskeletal: Normal range of motion.        General: No tenderness or edema.  Neurological: She is alert and oriented to person, place, and time. No cranial nerve deficit. She exhibits normal muscle tone. Coordination normal.  Skin: Skin is warm and dry. She is not diaphoretic. No erythema.  Left breast upper inner quadrant-5.5 x 4.5 cm mobile mass noted.  Psychiatric: Affect normal.     LABORATORY DATA:  I have reviewed the data as listed Lab  Results  Component Value Date   WBC 7.6 07/26/2019   HGB 11.6 (L) 07/26/2019   HCT 35.3 (L) 07/26/2019   MCV 90.7 07/26/2019   PLT 354 07/26/2019   Recent Labs    06/14/19 0854  07/05/19 0803 07/12/19 8756  07/16/19 0910 07/26/19 0835  NA 134*   < > 132* 129* 131* 135  K 4.0   < > 3.8 4.2 4.2 3.9  CL 100   < > 98 96* 96* 102  CO2 25   < > _0 GLUCOSE 110*   < > 116* 121* 109* 96  BUN 18   < > _1 CREATININE 0.86   < > 0.77 0.77 0.83 0.71  CALCIUM 9.5   < > 9.5 9.5 9.5 9.4  GFRNONAA >60   < > >60 >60 >60 >60  GFRAA >60   < > >60 >60 >60 >60  PROT 7.4  --  7.0  --   --  6.7  ALBUMIN 4.3  --  4.1  --   --  3.9  AST 24  --  21  --   --  19  ALT 19  --  15  --   --  15  ALKPHOS 102  --  95  --   --  106  BILITOT 0.5  --  0.7  --   --  0.4   < > = values in this interval not displayed.    RADIOGRAPHIC STUDIES: I have personally reviewed the radiological images as listed and agreed with the findings in the report. Nm Pet Image Initial (pi) Skull Base To Thigh  Result Date: 06/03/2019 CLINICAL DATA:  Initial treatment strategy for lymphoma of left breast. Recent biopsy. History of left breast cancer in 2006. Chemotherapy and radiation therapy 16 years ago. EXAM: NUCLEAR MEDICINE PET SKULL BASE TO THIGH TECHNIQUE: 5.8 mCi F-18 FDG was injected intravenously. Full-ring PET imaging was performed from the skull base to thigh after the radiotracer. CT data was obtained and used for attenuation correction and anatomic localization. Fasting blood glucose: 99 mg/dl COMPARISON:  11/03/2012 PET FINDINGS: Mediastinal blood pool activity: SUV max 2.3 Liver activity: SUV max 3.1 NECK: Left palatine tonsil hypermetabolism is without CT correlate and favored to be physiologic. Low-level hypermetabolism corresponding to jugular chain nodes, likely reactive. Example left-sided node at 6 mm and a S.U.V. max of 2.9 on image 37/3. Incidental CT findings: Bilateral carotid atherosclerosis.  No cervical adenopathy. CHEST: Medial left breast primary measures 3.6 x 2.5 cm and a S.U.V. max of 13.7 on image 103/3. No thoracic nodal or pulmonary parenchymal hypermetabolism identified. Incidental CT findings: Recently placed Port-A-Cath, given air along the course of the catheter. Terminates at the superior caval/atrial junction. Mild cardiomegaly, accentuated by a pectus excavatum deformity. Aortic and coronary artery atherosclerosis. No axillary adenopathy. 1.7 cm right-sided thyroid nodule on 57/3 is similar in size back in 2014. Left upper lobe calcified granuloma. Mild anterior left upper lobe radiation induced fibrosis. ABDOMEN/PELVIS: No abdominopelvic parenchymal or nodal hypermetabolism. Incidental CT findings: Normal adrenal glands. Abdominal aortic atherosclerosis. Interpolar left renal 3.2 cm low-density lesion is most consistent with a cyst. High right hepatic lobe subcentimeter low-density lesion is also likely a cyst. Hysterectomy. SKELETON: No abnormal marrow activity. Incidental CT findings: Right proximal femur fixation. Right hip osteoarthritis. IMPRESSION: 1. Medial left breast mass, consistent with the clinical history of lymphoma. No evidence of extrathoracic hypermetabolic lymphoma. 2. Coronary artery atherosclerosis. Aortic Atherosclerosis (ICD10-I70.0). Electronically Signed   By: Abigail Miyamoto M.D.   On: 06/03/2019 14:15   US Breast Ltd Uni Left Inc Axilla  Result Date: 04/28/2019 CLINICAL DATA:  History of left breast lymphoma in 2003. Patient complains of palpable abnormality in the  left breast. EXAM: DIGITAL DIAGNOSTIC BILATERAL MAMMOGRAM WITH CAD AND TOMO ULTRASOUND LEFT BREAST COMPARISON:  Previous exam(s). ACR Breast Density Category d: The breast tissue is extremely dense, which lowers the sensitivity of mammography. FINDINGS: There is a 3.2 cm mass in the upper slightly inner quadrant of the left breast. There are no malignant type microcalcifications in the left breast. No  suspicious mass or malignant type microcalcifications identified in the right breast. Mammographic images were processed with CAD. On physical exam, I palpate a discrete mass in the left breast at Targeted ultrasound is performed, showing an irregular hyperechoic mass with central areas of hypoechogenicity measuring 2.9 x 2.3 x 3.1 cm in the left breast at 12 o'clock 6 cm from the nipple. Sonographic evaluation the left axilla does not show any enlarged adenopathy. IMPRESSION: Suspicious mass in the 12 o'clock region of the left breast. RECOMMENDATION: Ultrasound-guided core biopsy of the left breast mass is recommended. I have discussed the findings and recommendations with the patient. Results were also provided in writing at the conclusion of the visit. If applicable, a reminder letter will be sent to the patient regarding the next appointment. BI-RADS CATEGORY  4: Suspicious. Electronically Signed   By: Lillia Mountain M.D.   On: 04/28/2019 11:29   Mm Diag Breast Tomo Bilateral  Result Date: 04/28/2019 CLINICAL DATA:  History of left breast lymphoma in 2003. Patient complains of palpable abnormality in the left breast. EXAM: DIGITAL DIAGNOSTIC BILATERAL MAMMOGRAM WITH CAD AND TOMO ULTRASOUND LEFT BREAST COMPARISON:  Previous exam(s). ACR Breast Density Category d: The breast tissue is extremely dense, which lowers the sensitivity of mammography. FINDINGS: There is a 3.2 cm mass in the upper slightly inner quadrant of the left breast. There are no malignant type microcalcifications in the left breast. No suspicious mass or malignant type microcalcifications identified in the right breast. Mammographic images were processed with CAD. On physical exam, I palpate a discrete mass in the left breast at Targeted ultrasound is performed, showing an irregular hyperechoic mass with central areas of hypoechogenicity measuring 2.9 x 2.3 x 3.1 cm in the left breast at 12 o'clock 6 cm from the nipple. Sonographic evaluation  the left axilla does not show any enlarged adenopathy. IMPRESSION: Suspicious mass in the 12 o'clock region of the left breast. RECOMMENDATION: Ultrasound-guided core biopsy of the left breast mass is recommended. I have discussed the findings and recommendations with the patient. Results were also provided in writing at the conclusion of the visit. If applicable, a reminder letter will be sent to the patient regarding the next appointment. BI-RADS CATEGORY  4: Suspicious. Electronically Signed   By: Lillia Mountain M.D.   On: 04/28/2019 11:29   Mm Clip Placement Left  Result Date: 05/12/2019 CLINICAL DATA:  Post biopsy mammogram of the left breast for clip placement. EXAM: DIAGNOSTIC LEFT MAMMOGRAM POST ULTRASOUND BIOPSY COMPARISON:  Previous exam(s). FINDINGS: Mammographic images were obtained following ultrasound guided biopsy of a mass in the superior left breast. The Venus shaped biopsy marking clip is well positioned within the mass in the upper inner left breast. IMPRESSION: Appropriate positioning of the venous shaped biopsy marking clip in the upper inner left breast. Final Assessment: Post Procedure Mammograms for Marker Placement Electronically Signed   By: Ammie Ferrier M.D.   On: 05/12/2019 14:05   [place] Lt Breast Bx W Loc Dev 1st Lesion Img Bx Spec US Guide  Addendum Date: 05/19/2019   ADDENDUM REPORT: 05/19/2019 11:21 ADDENDUM: PATHOLOGY revealed: A. BREAST,  LEFT AT 12:00, 6 CM FROM THE NIPPLE; ULTRASOUND GUIDED CORE BIOPSY: -DIFFUSE LARGE B CELL LYMPHOMA, FURTHER CLASSIFICATION PENDING IMMUNOHISTOCHEMICAL WORKUP. Comment: Sections display fibroadipose tissue with a dense cellular infiltrate comprised of large cells with high N:C ratio and significant nuclear atypia. Mitotic figures are frequent, and numerous apoptotic bodies are present. Residual mammary parenchyma is not identified. Immunohistochemical studies with appropriately reactive controls were performed, and show these abnormal cells to  be positive for CD45 (LCA) and CD20, compatible with B cells. A superpancytokeratin is negative. 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 Additional immunohistochemical and FISH studies to further classify this process are pending, and will be reported as an addendum. There is sufficient tissue for ancillary testing in both tissue blocks. Pathology results are CONCORDANT with imaging findings, per Dr. Ammie Ferrier. Pathology results were discussed with patient, and patient's daughter Becky Sax), via telephone. The patient reported doing well after the biopsy with tenderness at the site. Post biopsy care instructions were reviewed and questions were answered. The patient was encouraged to call [facility] for any additional concerns. Patient's final pathology report was faxed to [facility] RN at Dr. Otilio Connors office. Recommendation: Referral for medical oncologist will be arranged by Wendy Poet RN, with Dr. Otilio Connors office. Patient was instructed to continue with monthly self breast examinations, clinical follow-up as needed, and to have annual bilateral screening mammogram in one year. Addendum by Electa Sniff RN on 05/19/2019. Electronically Signed   By: Ammie Ferrier M.D.   On: 05/19/2019 11:21   Result Date: 05/19/2019 CLINICAL DATA:  85 year old female presenting for ultrasound-guided biopsy of a left breast mass. EXAM: ULTRASOUND GUIDED LEFT BREAST CORE NEEDLE BIOPSY COMPARISON:  Previous exam(s). FINDINGS: I met with the patient and we discussed the procedure of ultrasound-guided biopsy, including benefits and alternatives. We discussed the high likelihood of a successful procedure. We discussed the risks of the procedure, including infection, bleeding, tissue injury, clip migration, and inadequate sampling. Informed written consent was given. The usual time-out protocol was performed  immediately prior to the procedure. Lesion quadrant: Upper inner quadrant Using sterile technique and 1% Lidocaine as local anesthetic, under direct ultrasound visualization, a 14 gauge spring-loaded device was used to perform biopsy of a mass in the left breast at 11-12 o'clock using an inferior approach. At the conclusion of the procedure a venous shaped tissue marker clip was deployed into the biopsy cavity. Follow up 2 view mammogram was performed and dictated separately. IMPRESSION: Ultrasound guided biopsy of a left breast mass at 12 o'clock. No apparent complications. Electronically Signed: By: Ammie Ferrier M.D. On: 05/12/2019 13:36   Ir Imaging Guided Port Insertion  Result Date: 06/03/2019 INDICATION: 85 year old female referred for port catheter EXAM: IMPLANTED PORT A CATH PLACEMENT WITH ULTRASOUND AND FLUOROSCOPIC GUIDANCE MEDICATIONS: 2 g Ancef; The antibiotic was administered within an appropriate time interval prior to skin puncture. ANESTHESIA/SEDATION: Moderate (conscious) sedation was employed during this procedure. A total of Versed 2.0 mg and Fentanyl 100 mcg was administered intravenously. Moderate Sedation Time: 19 minutes. The patient's level of consciousness and vital signs were monitored continuously by radiology nursing throughout the procedure under my direct supervision. FLUOROSCOPY TIME:  0 minutes, 6 seconds (6.04 mGy) COMPLICATIONS: None PROCEDURE: The procedure, risks, benefits, and alternatives were explained to the patient. Questions regarding the procedure were encouraged and answered. The patient understands and consents to the procedure. Ultrasound survey was performed with  images stored and sent to PACs. The right neck and chest was prepped with chlorhexidine, and draped in the usual sterile fashion using maximum barrier technique (cap and mask, sterile gown, sterile gloves, large sterile sheet, hand hygiene and cutaneous antiseptic). Antibiotic prophylaxis was provided  with 2.0g Ancef administered IV one hour prior to skin incision. Local anesthesia was attained by infiltration with 1% lidocaine without epinephrine. Ultrasound demonstrated patency of the right internal jugular vein, and this was documented with an image. Under real-time ultrasound guidance, this vein was accessed with a 21 gauge micropuncture needle and image documentation was performed. A small dermatotomy was made at the access site with an 11 scalpel. A 0.018" wire was advanced into the SVC and used to estimate the length of the internal catheter. The access needle exchanged for a 57F micropuncture vascular sheath. The 0.018" wire was then removed and a 0.035" wire advanced into the IVC. An appropriate location for the subcutaneous reservoir was selected below the clavicle and an incision was made through the skin and underlying soft tissues. The subcutaneous tissues were then dissected using a combination of blunt and sharp surgical technique and a pocket was formed. A single lumen power injectable portacatheter was then tunneled through the subcutaneous tissues from the pocket to the dermatotomy and the port reservoir placed within the subcutaneous pocket. The venous access site was then serially dilated and a peel away vascular sheath placed over the wire. The wire was removed and the port catheter advanced into position under fluoroscopic guidance. The catheter tip is positioned in the cavoatrial junction. This was documented with a spot image. The portacatheter was then tested and found to flush and aspirate well. The port was flushed with saline followed by 100 units/mL heparinized saline. The pocket was then closed in two layers using first subdermal inverted interrupted absorbable sutures followed by a running subcuticular suture. The epidermis was then sealed with Dermabond. The dermatotomy at the venous access site was also seal with Dermabond. Patient tolerated the procedure well and remained  hemodynamically stable throughout. No complications encountered and no significant blood loss encountered IMPRESSION: Status post right IJ port catheter placement. Catheter ready for use. Signed, Dulcy Fanny. Dellia Nims, RPVI [facility] Radiology Electronically Signed   By: Corrie Mckusick D.O.   On: 06/03/2019 08:10   ASSESSMENT & PLAN:  1. Lymphoma of breast ([place])   2. Encounter for antineoplastic chemotherapy   3. Normocytic anemia    Breast lymphoma,  Tolerating R mini CHOP well. Labs are reviewed and discussed with patient. Counts are acceptable to proceed with today's R mini CHOP treatments. Normocytic anemia, likely secondary to chemotherapy.  Continue to monitor. Patient will receive G-CSF support on day 2. She understands that she will need to take prednisone 20 mg day 1 to day 5.   Patient's daughter [name] was called and updated.  She agrees with the treatment plan. All questions were answered. The patient knows to call the clinic with any problems, questions or concerns. Follow-up with Dr. Rogue Bussing in 3 weeks.   Earlie Server, MD 07/26/2019 5:17 PM

## 2019-07-27 ENCOUNTER — Other Ambulatory Visit: Payer: Self-pay

## 2019-07-27 ENCOUNTER — Inpatient Hospital Stay: Payer: Medicare Other

## 2019-07-27 DIAGNOSIS — C8599 Non-Hodgkin lymphoma, unspecified, extranodal and solid organ sites: Secondary | ICD-10-CM

## 2019-07-27 DIAGNOSIS — Z5111 Encounter for antineoplastic chemotherapy: Secondary | ICD-10-CM | POA: Diagnosis not present

## 2019-07-27 MED ORDER — PEGFILGRASTIM-JMDB 6 MG/0.6ML ~~LOC~~ SOSY
6.0000 mg | PREFILLED_SYRINGE | Freq: Once | SUBCUTANEOUS | Status: AC
  Administered 2019-07-27: 15:00:00 6 mg via SUBCUTANEOUS
  Filled 2019-07-27: qty 0.6

## 2019-07-28 ENCOUNTER — Non-Acute Institutional Stay: Payer: Medicare Other | Admitting: Adult Health Nurse Practitioner

## 2019-07-28 DIAGNOSIS — Z515 Encounter for palliative care: Secondary | ICD-10-CM

## 2019-07-28 NOTE — Progress Notes (Signed)
[facility] Consult Note Telephone: [phone]  Fax: [phone]  PATIENT NAME: Stephanie Davidson DOB: 1930-10-27 MRN: [id number]  PRIMARY CARE PROVIDER:   Derinda Late, MD  REFERRING PROVIDER:  Altha Harm NP  RESPONSIBLE PARTY:   Stephanie Davidson, daughter [phone]    RECOMMENDATIONS and PLAN:  1.  Advanced care planning.  Patient is a full code.  Daughter unable to join on visit today.  Son joined visit via Zoom and stated that his sister, Stephanie Davidson was HCPOA and that I would go over MOST with her.  Have appointment after Thanksgiving.  2.  Lymphoma of breast.  Patient has undergone 2 rounds of chemotherapy and overall states feeling well.  Has started loosing hair and does admit to feeling tired at times but not as much as she thought she would. Does state having constipation and gets relief with Miralax.  Denies pain, SOB, cough, nausea, vomiting, diarrhea, fever.  Continue follow up and treatment by oncology. Will continue to follow for monitoring and help with symptom management as she will also be going through radiation after 4 rounds of chemo.    3.  Medication management.  [facility] helps put AM and PM medications in a pill box for patient.  She has notes that remind her to take medications that aren't taken everyday and are not in the pill box.  She has note pad to write down when she takes these medications, such as her prednisone and miralax.  Do have concerns that she may be forgetting to take her prednisone as directed.  Have left VM with Jackey Loge SW to see what assistance we can get with her medication management.    4.  Dementia.  Patient is in early stages and does have some forgetfulness.  She is able to ambulate without assistive devices and takes walks daily for exercise.  She is continent of [place].  A&O x3. She is still able to cook and clean for herself. Still able to do all these activities while  undergoing chemo. Will start having [facility] cleaning service come in every other week to help with some cleaning like laundry and vacuuming.  No changes in appetite and with no weight loss reported.  Weighs 105.    I spent 50 minutes providing this consultation,  from 9:00 to 9:50. More than 50% of the time in this consultation was spent coordinating communication.   HISTORY OF PRESENT ILLNESS:  ZYMIA YACKO is a 85 y.o. year old female with multiple medical problems including lymphoma of breast, early alzheimer's dementia, HTN. Palliative Care was asked to help address goals of care.   CODE STATUS: full code  PPS: 60% HOSPICE ELIGIBILITY/DIAGNOSIS: TBD  PHYSICAL EXAM:   General: NAD, frail appearing, thin Extremities: no edema, no joint deformities Neurological: Weakness but otherwise nonfocal; does have some forgetfulness related to early Alzheimer's dementia  PAST MEDICAL HISTORY:  Past Medical History:  Diagnosis Date  . Breast cancer ([place])   . Hypertension   . Personal history of chemotherapy   . Personal history of radiation therapy     SOCIAL HX:  Social History   Tobacco Use  . Smoking status: Never Smoker  . Smokeless tobacco: Never Used  Substance Use Topics  . Alcohol use: No    ALLERGIES: No Known Allergies   PERTINENT MEDICATIONS:  Outpatient Encounter Medications as of 07/28/2019  Medication Sig  . acetaminophen (TYLENOL) 500 MG tablet Take 500 mg by  mouth every 6 (six) hours as needed.  Marland Kitchen amLODipine (NORVASC) 10 MG tablet Take 1 tablet by mouth daily.  . Calcium Carbonate-Vitamin D (CALTRATE 600+D PO) Take 1 tablet by mouth 2 (two) times daily.  Marland Kitchen docusate calcium (SURFAK) 240 MG capsule Take 1 capsule by mouth 2 (two) times daily.  Marland Kitchen donepezil (ARICEPT) 10 MG tablet Take 1 tablet by mouth at bedtime.  . fluticasone (FLONASE) 50 MCG/ACT nasal spray Place 2 sprays into both nostrils daily.  . hydrochlorothiazide (HYDRODIURIL) 25 MG tablet Take 1 tablet  by mouth daily.  . irbesartan (AVAPRO) 300 MG tablet Take 1 tablet by mouth daily.  Marland Kitchen lidocaine-prilocaine (EMLA) cream Apply to affected area once  . meloxicam (MOBIC) 15 MG tablet Take 1 tablet by mouth as needed.  . Multiple Vitamin (MULTI-VITAMIN) tablet Take 1 tablet by mouth daily.  . Omega-3 Fatty Acids (FISH OIL) 1000 MG CAPS Take 1 tablet by mouth daily.  Marland Kitchen omeprazole (PRILOSEC) 20 MG capsule Take 1 capsule by mouth daily.  . ondansetron (ZOFRAN) 8 MG tablet Take 1 tablet (8 mg total) by mouth 2 (two) times daily as needed for refractory nausea / vomiting.  . predniSONE (DELTASONE) 20 MG tablet Take 3 tablets (60 mg total) by mouth daily with breakfast. Take on days 1-5 of chemotherapy.  . prochlorperazine (COMPAZINE) 10 MG tablet Take 1 tablet (10 mg total) by mouth every 6 (six) hours as needed (Nausea or vomiting).  . traZODone (DESYREL) 50 MG tablet Take 1 tablet by mouth 2 (two) times daily at 8 am and 10 pm.   Facility-Administered Encounter Medications as of 07/28/2019  Medication  . heparin lock flush 100 unit/mL  . sodium chloride flush (NS) 0.9 % injection 10 mL      Anuar Walgren Jenetta Downer, NP

## 2019-08-13 ENCOUNTER — Other Ambulatory Visit: Payer: Self-pay

## 2019-08-16 ENCOUNTER — Inpatient Hospital Stay: Payer: Medicare Other

## 2019-08-16 ENCOUNTER — Inpatient Hospital Stay (HOSPITAL_BASED_OUTPATIENT_CLINIC_OR_DEPARTMENT_OTHER): Payer: Medicare Other | Admitting: Hospice and Palliative Medicine

## 2019-08-16 ENCOUNTER — Other Ambulatory Visit: Payer: Self-pay

## 2019-08-16 ENCOUNTER — Inpatient Hospital Stay (HOSPITAL_BASED_OUTPATIENT_CLINIC_OR_DEPARTMENT_OTHER): Payer: Medicare Other | Admitting: Internal Medicine

## 2019-08-16 ENCOUNTER — Inpatient Hospital Stay: Payer: Medicare Other | Admitting: *Deleted

## 2019-08-16 VITALS — BP 126/64 | HR 81 | Temp 97.9°F | Resp 17

## 2019-08-16 DIAGNOSIS — C8599 Non-Hodgkin lymphoma, unspecified, extranodal and solid organ sites: Secondary | ICD-10-CM | POA: Diagnosis not present

## 2019-08-16 DIAGNOSIS — Z515 Encounter for palliative care: Secondary | ICD-10-CM | POA: Diagnosis not present

## 2019-08-16 DIAGNOSIS — Z5111 Encounter for antineoplastic chemotherapy: Secondary | ICD-10-CM | POA: Diagnosis not present

## 2019-08-16 DIAGNOSIS — Z95828 Presence of other vascular implants and grafts: Secondary | ICD-10-CM

## 2019-08-16 LAB — CBC WITH DIFFERENTIAL/PLATELET
Abs Immature Granulocytes: 0.04 10*3/uL (ref 0.00–0.07)
Basophils Absolute: 0.1 10*3/uL (ref 0.0–0.1)
Basophils Relative: 1 %
Eosinophils Absolute: 0.1 10*3/uL (ref 0.0–0.5)
Eosinophils Relative: 1 %
HCT: 32.6 % — ABNORMAL LOW (ref 36.0–46.0)
Hemoglobin: 10.7 g/dL — ABNORMAL LOW (ref 12.0–15.0)
Immature Granulocytes: 0 %
Lymphocytes Relative: 6 %
Lymphs Abs: 0.6 10*3/uL — ABNORMAL LOW (ref 0.7–4.0)
MCH: 30.5 pg (ref 26.0–34.0)
MCHC: 32.8 g/dL (ref 30.0–36.0)
MCV: 92.9 fL (ref 80.0–100.0)
Monocytes Absolute: 0.3 10*3/uL (ref 0.1–1.0)
Monocytes Relative: 3 %
Neutro Abs: 9.3 10*3/uL — ABNORMAL HIGH (ref 1.7–7.7)
Neutrophils Relative %: 89 %
Platelets: 346 10*3/uL (ref 150–400)
RBC: 3.51 MIL/uL — ABNORMAL LOW (ref 3.87–5.11)
RDW: 15.2 % (ref 11.5–15.5)
WBC: 10.3 10*3/uL (ref 4.0–10.5)
nRBC: 0 % (ref 0.0–0.2)

## 2019-08-16 LAB — COMPREHENSIVE METABOLIC PANEL
ALT: 15 U/L (ref 0–44)
AST: 18 U/L (ref 15–41)
Albumin: 3.9 g/dL (ref 3.5–5.0)
Alkaline Phosphatase: 121 U/L (ref 38–126)
Anion gap: 6 (ref 5–15)
BUN: 19 mg/dL (ref 8–23)
CO2: 25 mmol/L (ref 22–32)
Calcium: 8.9 mg/dL (ref 8.9–10.3)
Chloride: 101 mmol/L (ref 98–111)
Creatinine, Ser: 0.63 mg/dL (ref 0.44–1.00)
GFR calc Af Amer: >60 mL/min (ref >60)
GFR calc non Af Amer: >60 mL/min (ref >60)
Glucose, Bld: 110 mg/dL — ABNORMAL HIGH (ref 70–99)
Potassium: 4.1 mmol/L (ref 3.5–5.1)
Sodium: 132 mmol/L — ABNORMAL LOW (ref 135–145)
Total Bilirubin: 0.5 mg/dL (ref 0.3–1.2)
Total Protein: 6.7 g/dL (ref 6.5–8.1)

## 2019-08-16 LAB — LACTATE DEHYDROGENASE: LDH: 186 U/L (ref 98–192)

## 2019-08-16 MED ORDER — SODIUM CHLORIDE 0.9 % IV SOLN
Freq: Once | INTRAVENOUS | Status: AC
  Administered 2019-08-16: 10:00:00 via INTRAVENOUS
  Filled 2019-08-16: qty 5

## 2019-08-16 MED ORDER — SODIUM CHLORIDE 0.9 % IV SOLN
400.0000 mg/m2 | Freq: Once | INTRAVENOUS | Status: AC
  Administered 2019-08-16: 580 mg via INTRAVENOUS
  Filled 2019-08-16: qty 29

## 2019-08-16 MED ORDER — VINCRISTINE SULFATE CHEMO INJECTION 1 MG/ML
2.0000 mg | Freq: Once | INTRAVENOUS | Status: AC
  Administered 2019-08-16: 2 mg via INTRAVENOUS
  Filled 2019-08-16: qty 2

## 2019-08-16 MED ORDER — PALONOSETRON HCL INJECTION 0.25 MG/5ML
0.2500 mg | Freq: Once | INTRAVENOUS | Status: AC
  Administered 2019-08-16: 0.25 mg via INTRAVENOUS
  Filled 2019-08-16: qty 5

## 2019-08-16 MED ORDER — SODIUM CHLORIDE 0.9 % IV SOLN
Freq: Once | INTRAVENOUS | Status: AC
  Administered 2019-08-16: 10:00:00 via INTRAVENOUS
  Filled 2019-08-16: qty 250

## 2019-08-16 MED ORDER — SODIUM CHLORIDE 0.9 % IV SOLN
375.0000 mg/m2 | Freq: Once | INTRAVENOUS | Status: AC
  Administered 2019-08-16: 500 mg via INTRAVENOUS
  Filled 2019-08-16: qty 50

## 2019-08-16 MED ORDER — SODIUM CHLORIDE 0.9% FLUSH
10.0000 mL | Freq: Once | INTRAVENOUS | Status: AC
  Administered 2019-08-16: 10 mL via INTRAVENOUS
  Filled 2019-08-16: qty 10

## 2019-08-16 MED ORDER — DOXORUBICIN HCL CHEMO IV INJECTION 2 MG/ML
25.0000 mg/m2 | Freq: Once | INTRAVENOUS | Status: AC
  Administered 2019-08-16: 36 mg via INTRAVENOUS
  Filled 2019-08-16: qty 18

## 2019-08-16 MED ORDER — DIPHENHYDRAMINE HCL 25 MG PO CAPS
50.0000 mg | ORAL_CAPSULE | Freq: Once | ORAL | Status: AC
  Administered 2019-08-16: 50 mg via ORAL
  Filled 2019-08-16: qty 2

## 2019-08-16 MED ORDER — HEPARIN SOD (PORK) LOCK FLUSH 100 UNIT/ML IV SOLN
500.0000 [IU] | Freq: Once | INTRAVENOUS | Status: AC | PRN
  Administered 2019-08-16: 500 [IU]
  Filled 2019-08-16: qty 5

## 2019-08-16 MED ORDER — ACETAMINOPHEN 325 MG PO TABS
650.0000 mg | ORAL_TABLET | Freq: Once | ORAL | Status: AC
  Administered 2019-08-16: 650 mg via ORAL
  Filled 2019-08-16: qty 2

## 2019-08-16 NOTE — Progress Notes (Signed)
[facility]  [phone] [phone]   Name: Stephanie Davidson Date: 08/16/2019 MRN: [id number]  DOB: 12-03-1930  Patient Care Team: Derinda Late, MD as PCP - General (Family Medicine)    REASON FOR CONSULTATION: Palliative Care consult requested for this 85 y.o. 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  Patient had severe symptoms from previous treatment and was initially reluctant to pursue further cancer treatment.  Patient was referred to palliative care to help address goals and provide with support.   SOCIAL HISTORY:     reports that she has never smoked. She has never used smokeless tobacco. She reports that she does not drink alcohol.   Patient is widowed.  She lives at [facility] in independent living.  She has a son and [place] and a daughter in [place].  ADVANCE DIRECTIVES:  Not on file.  Her son, [name], is her healthcare power of attorney and her daughter, [name], is her financial POA.  Patient reportedly has a living will.  CODE STATUS:   PAST MEDICAL HISTORY: Past Medical History:  Diagnosis Date   Breast cancer ([place])    Hypertension    Personal history of chemotherapy    Personal history of radiation therapy     PAST SURGICAL HISTORY:  Past Surgical History:  Procedure Laterality Date   BREAST BIOPSY Right ?   needle bx-benign   BREAST BIOPSY Left 05/12/2019   [place] bx 12:00, [place] marker, path pending   BREAST EXCISIONAL BIOPSY Left 2003   lymphoma   BREAST EXCISIONAL BIOPSY Left ?   lump was removed, benign   IR IMAGING GUIDED PORT INSERTION  06/02/2019    HEMATOLOGY/ONCOLOGY HISTORY:  Oncology History Overview Note  # AUG 2020- LEFT BREAST DLBCL [non-GCB subtype; Double expressor;NEG- CD-30; Ki-67-80-90%]; FISH -negative for translocations; SEP  2020- PET-left breast intense uptake; no distant disease noted.  Hold off bone marrow biopsy [patient/family preference not to be aggressive]; Stage adjusted IPI-"2 points" cure rate 50-60%.   # 9/21- Pred+Rituxan;  -------------------------------------------------------------------------------------------- # 2006- DLBCL of Breast [s? 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    ;GOALS: Cure  CURRENT/MOST RECENT THERAPY:Rituxan-+Pred    Lymphoma of breast ([place])  05/25/2019 Initial Diagnosis   Lymphoma of breast ([place])   06/14/2019 -  Chemotherapy   The patient had DOXOrubicin (ADRIAMYCIN) chemo injection 36 mg, 25 mg/m2 = 36 mg (100 % of original dose 25 mg/m2), Intravenous,  Once, 3 of 4 cycles Dose modification: 25 mg/m2 (original dose 25 mg/m2, Cycle 2, Reason: Provider Judgment) Administration: 36 mg (07/05/2019), 36 mg (07/26/2019) palonosetron (ALOXI) injection 0.25 mg, 0.25 mg, Intravenous,  Once, 3 of 4 cycles Administration: 0.25 mg (07/05/2019), 0.25 mg (07/26/2019) pegfilgrastim-jmdb (FULPHILA) injection 6 mg, 6 mg, Subcutaneous,  Once, 3 of 4 cycles Administration: 6 mg (07/06/2019), 6 mg (07/27/2019) vinCRIStine (ONCOVIN) 2 mg in sodium chloride 0.9 % 50 mL chemo infusion, 2 mg, Intravenous,  Once, 3 of 4 cycles Administration: 2 mg (07/05/2019), 2 mg (07/26/2019) riTUXimab (RITUXAN) 500 mg in sodium chloride 0.9 % 250 mL (1.6667 mg/mL) infusion, 375 mg/m2 = 500 mg, Intravenous,  Once, 1 of 1 cycle Administration: 500 mg (06/14/2019) cyclophosphamide (CYTOXAN) 580 mg in sodium chloride 0.9 % 250 mL chemo infusion, 400 mg/m2 = 580  mg (100 % of original dose 400 mg/m2), Intravenous,  Once, 3 of 4 cycles Dose modification: 600 mg/m2 (original dose 400 mg/m2, Cycle 2, Reason: Provider Judgment), 400 mg/m2 (original dose 400 mg/m2, Cycle 2, Reason: Provider Judgment) Administration: 580 mg (07/05/2019), 580 mg  (07/26/2019) fosaprepitant (EMEND) 150 mg, dexamethasone (DECADRON) 12 mg in sodium chloride 0.9 % 145 mL IVPB, , Intravenous,  Once, 3 of 4 cycles Administration:  (07/05/2019),  (07/26/2019) riTUXimab-pvvr (RUXIENCE) 500 mg in sodium chloride 0.9 % 250 mL (1.6667 mg/mL) infusion, 375 mg/m2 = 500 mg (100 % of original dose 375 mg/m2), Intravenous,  Once, 1 of 1 cycle Dose modification: 375 mg/m2 (original dose 375 mg/m2, Cycle 2, Reason: Other (see comments), Comment: insurance)  for chemotherapy treatment.      ALLERGIES:  has No Known Allergies.  MEDICATIONS:  Current Outpatient Medications  Medication Sig Dispense Refill   acetaminophen (TYLENOL) 500 MG tablet Take 500 mg by mouth every 6 (six) hours as needed.     amLODipine (NORVASC) 10 MG tablet Take 1 tablet by mouth daily.     Calcium Carbonate-Vitamin D (CALTRATE 600+D PO) Take 1 tablet by mouth 2 (two) times daily.     docusate calcium (SURFAK) 240 MG capsule Take 1 capsule by mouth 2 (two) times daily.     donepezil (ARICEPT) 10 MG tablet Take 1 tablet by mouth at bedtime.     fluticasone (FLONASE) 50 MCG/ACT nasal spray Place 2 sprays into both nostrils daily.     hydrochlorothiazide (HYDRODIURIL) 25 MG tablet Take 1 tablet by mouth daily.     irbesartan (AVAPRO) 300 MG tablet Take 1 tablet by mouth daily.     lidocaine-prilocaine (EMLA) cream Apply to affected area once 30 g 3   MELATONIN PO Take 1 tablet by mouth daily.     meloxicam (MOBIC) 15 MG tablet Take 1 tablet by mouth as needed.     Multiple Vitamin (MULTI-VITAMIN) tablet Take 1 tablet by mouth daily.     Omega-3 Fatty Acids (FISH OIL) 1000 MG CAPS Take 1 tablet by mouth daily.     omeprazole (PRILOSEC) 20 MG capsule Take 1 capsule by mouth daily.     ondansetron (ZOFRAN) 8 MG tablet Take 1 tablet (8 mg total) by mouth 2 (two) times daily as needed for refractory nausea / vomiting. 30 tablet 1   predniSONE (DELTASONE) 20 MG tablet Take 3 tablets (60  mg total) by mouth daily with breakfast. Take on days 1-5 of chemotherapy. 60 tablet 1   prochlorperazine (COMPAZINE) 10 MG tablet Take 1 tablet (10 mg total) by mouth every 6 (six) hours as needed (Nausea or vomiting). 30 tablet 6   traZODone (DESYREL) 50 MG tablet Take 1 tablet by mouth 2 (two) times daily at 8 am and 10 pm.     No current facility-administered medications for this visit.    Facility-Administered Medications Ordered in Other Visits  Medication Dose Route Frequency Provider Last Rate Last Dose   0.9 %  sodium chloride infusion   Intravenous Once Cammie Sickle, MD       acetaminophen (TYLENOL) tablet 650 mg  650 mg Oral Once Cammie Sickle, MD       cyclophosphamide (CYTOXAN) 580 mg in sodium chloride 0.9 % 250 mL chemo infusion  400 mg/m2 (Treatment Plan Recorded) Intravenous Once Cammie Sickle, MD       diphenhydrAMINE (BENADRYL) capsule 50 mg  50 mg Oral Once Cammie Sickle, MD  DOXOrubicin (ADRIAMYCIN) chemo injection 36 mg  25 mg/m2 (Treatment Plan Recorded) Intravenous Once Cammie Sickle, MD       fosaprepitant (EMEND) 150 mg, dexamethasone (DECADRON) 12 mg in sodium chloride 0.9 % 145 mL IVPB   Intravenous Once Charlaine Dalton R, MD       heparin lock flush 100 unit/mL  500 Units Intravenous Once Faythe Casa E, NP       heparin lock flush 100 unit/mL  500 Units Intracatheter Once PRN Cammie Sickle, MD       palonosetron (ALOXI) injection 0.25 mg  0.25 mg Intravenous Once Cammie Sickle, MD       riTUXimab-pvvr (RUXIENCE) 500 mg in sodium chloride 0.9 % 200 mL infusion  375 mg/m2 (Treatment Plan Recorded) Intravenous Once Cammie Sickle, MD       sodium chloride flush (NS) 0.9 % injection 10 mL  10 mL Intravenous PRN Jacquelin Hawking, NP       vinCRIStine (ONCOVIN) 2 mg in sodium chloride 0.9 % 50 mL chemo infusion  2 mg Intravenous Once Cammie Sickle, MD        VITAL  SIGNS: There were no vitals taken for this visit. There were no vitals filed for this visit.  Estimated body mass index is 20.14 kg/m as calculated from the following:   Height as of 07/05/19: '5\' 1"'  (1.549 m).   Weight as of an earlier encounter on 08/16/19: 106 lb 9.6 oz (48.4 kg).  LABS: CBC:    Component Value Date/Time   WBC 10.3 08/16/2019 0809   HGB 10.7 (L) 08/16/2019 0809   HGB 14.1 10/28/2012 0950   HCT 32.6 (L) 08/16/2019 0809   HCT 41.4 10/28/2012 0950   PLT 346 08/16/2019 0809   PLT 200 10/28/2012 0950   MCV 92.9 08/16/2019 0809   MCV 92 10/28/2012 0950   NEUTROABS 9.3 (H) 08/16/2019 0809   NEUTROABS 3.8 10/28/2012 0950   LYMPHSABS 0.6 (L) 08/16/2019 0809   LYMPHSABS 1.4 10/28/2012 0950   MONOABS 0.3 08/16/2019 0809   MONOABS 0.4 10/28/2012 0950   EOSABS 0.1 08/16/2019 0809   EOSABS 0.2 10/28/2012 0950   BASOSABS 0.1 08/16/2019 0809   BASOSABS 0.0 10/28/2012 0950   Comprehensive Metabolic Panel:    Component Value Date/Time   NA 132 (L) 08/16/2019 0809   NA 139 10/28/2012 0950   K 4.1 08/16/2019 0809   K 3.4 (L) 02/18/2013 1508   CL 101 08/16/2019 0809   CL 98 10/28/2012 0950   CO2 25 08/16/2019 0809   CO2 33 (H) 10/28/2012 0950   BUN 19 08/16/2019 0809   BUN 16 10/28/2012 0950   CREATININE 0.63 08/16/2019 0809   CREATININE 0.74 10/28/2012 0950   GLUCOSE 110 (H) 08/16/2019 0809   GLUCOSE 105 (H) 10/28/2012 0950   CALCIUM 8.9 08/16/2019 0809   CALCIUM 10.0 10/28/2012 0950   AST 18 08/16/2019 0809   AST 26 10/28/2012 0950   ALT 15 08/16/2019 0809   ALT 28 10/28/2012 0950   ALKPHOS 121 08/16/2019 0809   ALKPHOS 142 (H) 10/28/2012 0950   BILITOT 0.5 08/16/2019 0809   BILITOT 0.4 10/28/2012 0950   PROT 6.7 08/16/2019 0809   PROT 8.2 10/28/2012 0950   ALBUMIN 3.9 08/16/2019 0809   ALBUMIN 4.0 10/28/2012 0950    RADIOGRAPHIC STUDIES: No results found.  PERFORMANCE STATUS (ECOG) : 0 - Asymptomatic  Review of Systems Unless otherwise noted, a  complete review of systems is negative.  Physical Exam General: NAD, frail appearing, thin Pulmonary: Unlabored Extremities: no edema, no joint deformities Skin: no rashes Neurological: Weakness but otherwise nonfocal  IMPRESSION: Routine follow-up visit today.  Patient was a complaint by her son.  Patient feels she is doing reasonably well.  She is still walking a mile a day and living independently at [facility].  Patient denies any distressing symptoms.  Her son was concerned about her insomnia but patient feels like she is getting good quality sleep and is not experiencing any daytime fatigue.  Patient denies any difficulty initiating sleep but just wakes early at about 4:30 or 5:00 each morning.  We discussed use of trazodone as needed.  Appetite is reportedly good.  Weight is fairly stable at 106 pounds.  Patient will see Desmond Lope, RD today.  Discussed use of oral supplements.  We again discussed completion of MOST Form.  Son thinks that he and his sister looked at it but was not sure what they discussed.  He plans to talk with his sister later today.  PLAN: -Continue current scope of treatment -Recommend oral supplements 3 times daily -MOST form previously reviewed -RTC in 3 weeks   Patient expressed understanding and was in agreement with this plan. She also understands that She can call the clinic at any time with any questions, concerns, or complaints.     Time Total: 15 minutes  Visit consisted of counseling and education dealing with the complex and emotionally intense issues of symptom management and palliative care in the setting of serious and potentially life-threatening illness.Greater than 50%  of this time was spent counseling and coordinating care related to the above assessment and plan.  Signed by: Altha Harm, PhD, NP-C [phone] (Work Cell)

## 2019-08-16 NOTE — Progress Notes (Signed)
Nutrition Assessment   Reason for Assessment:   Referral from Josh, NP, weight loss   ASSESSMENT:  85 year old female with large B-cell lymphoma of the breast. Past medical history of breast cancer, HTN, dementia.  Patient receiving chemotherapy.   Met with patient during infusion.  Patient lives at [facility] in 2 bed room apartment.  Moved from villas to apartment to be able to walk to activities which has since been shut down due to COVID-19.  Patient has been walking about 1 mile around the property.  Reports that she has a good appetite.  Typically orders 1 meal per day from kitchen (either lunch or dinner). Always orders meat and 2 vegetables, plus salad sometimes and dessert.  Reports that she usually eats off of it at 2 meals.  If makes her own chicken salad, pimento cheese that she enjoys.  Usually for breakfast east quiche with coffee and later has breakfast bar and more coffee.  Snacks on trail mix that neighbour brought her.  Has not really been drinking oral nutrition supplements.   Denies any nutrition impact symptoms   Nutrition Focused Physical Exam: deferred   Medications: calcium carbonate, Vit D, aricept, predinsone, compazine, MVI, omega 3, zofran   Labs: reviewed   Anthropometrics:   Height: 61 inches Weight: 106 lb 6.9 oz today increased from 103 lb on 11/2 108 lb on 9/29.  Patient reports UBW of 110 lb BMI: 19   Estimated Energy Needs  Kcals: 1400-1600 Protein: 70-80 g Fluid: 1.4 L   NUTRITION DIAGNOSIS: Increased nutrient needs related to cancer diagnosis as evidenced by estimated needs    INTERVENTION:  Discussed strategies to continue to increase calories and protein to promote weight gain.  Handout from [facility] provided. Encouraged frequent meals. Contact information provided   MONITORING, EVALUATION, GOAL: Patient will consume adequate calories and protein to prevent weight loss   Next Visit: Dec 14 during infusion   B. , RD,  LDN Registered Dietitian [phone] (pager)        

## 2019-08-16 NOTE — Assessment & Plan Note (Addendum)
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 Stage I E. S/p 2- R-miniCHOP appx 3 weeks ago.  Partial response.  # proceed with mini-RCHOP today #3; Labs today reviewed;  acceptable for treatment today. Will plan PET prior to next cycle; will plan a total of 4 cycles of chemo; also recommend radiation based upon  # Dementia: stable/continues to be fairly independent.  # constipation:new;  sec to chemo-miralax; increase fluid intake add Dulcolax as needed.  # I spoke at length with the patient's family/ Kim, son-regarding the patient's clinical status/plan of care.  Family agreement.   # DISPOSITION: # Proceed with chemo today; d-2 fulphilia # appx. 1 week labs-cbc/bmp # in 3 weeks- MD;labs-cbc/cmp/LDH. mini R-CHOP.; d-2 fluphilia; PET prior--dr.B

## 2019-08-16 NOTE — Progress Notes (Signed)
Blood return noted before, every 3cc and after Adriamycin push. Blood return noted before and after Vincristine infusion.   1322: Pt and VS stable at discharge.

## 2019-08-16 NOTE — Progress Notes (Signed)
[facility] CONSULT NOTE  Patient Care Team: Derinda Late, MD as PCP - General (Family Medicine)  CHIEF COMPLAINTS/PURPOSE OF CONSULTATION: Breast lymphoma   Oncology History Overview Note  # AUG 2020- LEFT BREAST DLBCL [non-GCB subtype; Double expressor;NEG- CD-30; Ki-67-80-90%]; FISH -negative for translocations; SEP 2020- PET-left breast intense uptake; no distant disease noted.  Hold off bone marrow biopsy [patient/family preference not to be aggressive]; Stage adjusted IPI-"2 points" cure rate 50-60%.   # 9/21- Pred+Rituxan; OCT 2020- mini-R-CHOP -------------------------------------------------------------------------------------------- # 2006- DLBCL of Breast [s? 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    ;GOALS: Cure  CURRENT/MOST RECENT THERAPY:mini-RCHOP    Lymphoma of breast ([place])  05/25/2019 Initial Diagnosis   Lymphoma of breast ([place])   06/14/2019 -  Chemotherapy   The patient had DOXOrubicin (ADRIAMYCIN) chemo injection 36 mg, 25 mg/m2 = 36 mg (100 % of original dose 25 mg/m2), Intravenous,  Once, 3 of 4 cycles Dose modification: 25 mg/m2 (original dose 25 mg/m2, Cycle 2, Reason: Provider Judgment) Administration: 36 mg (07/05/2019), 36 mg (07/26/2019) palonosetron (ALOXI) injection 0.25 mg, 0.25 mg, Intravenous,  Once, 3 of 4 cycles Administration: 0.25 mg (07/05/2019), 0.25 mg (07/26/2019) pegfilgrastim-jmdb (FULPHILA) injection 6 mg, 6 mg, Subcutaneous,  Once, 3 of 4 cycles Administration: 6 mg (07/06/2019), 6 mg (07/27/2019) vinCRIStine (ONCOVIN) 2 mg in sodium chloride 0.9 % 50 mL chemo infusion, 2 mg, Intravenous,  Once, 3 of 4 cycles Administration: 2 mg (07/05/2019), 2 mg (07/26/2019) riTUXimab (RITUXAN) 500 mg in sodium chloride 0.9 % 250 mL (1.6667 mg/mL) infusion, 375 mg/m2 = 500 mg, Intravenous,  Once, 1 of 1 cycle Administration: 500 mg (06/14/2019) cyclophosphamide  (CYTOXAN) 580 mg in sodium chloride 0.9 % 250 mL chemo infusion, 400 mg/m2 = 580 mg (100 % of original dose 400 mg/m2), Intravenous,  Once, 3 of 4 cycles Dose modification: 600 mg/m2 (original dose 400 mg/m2, Cycle 2, Reason: Provider Judgment), 400 mg/m2 (original dose 400 mg/m2, Cycle 2, Reason: Provider Judgment) Administration: 580 mg (07/05/2019), 580 mg (07/26/2019) fosaprepitant (EMEND) 150 mg, dexamethasone (DECADRON) 12 mg in sodium chloride 0.9 % 145 mL IVPB, , Intravenous,  Once, 3 of 4 cycles Administration:  (07/05/2019),  (07/26/2019) riTUXimab-pvvr (RUXIENCE) 500 mg in sodium chloride 0.9 % 250 mL (1.6667 mg/mL) infusion, 375 mg/m2 = 500 mg (100 % of original dose 375 mg/m2), Intravenous,  Once, 1 of 1 cycle Dose modification: 375 mg/m2 (original dose 375 mg/m2, Cycle 2, Reason: Other (see comments), Comment: insurance)  for chemotherapy treatment.       HISTORY OF PRESENTING ILLNESS:  Stephanie Davidson 85 y.o.  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  Patient currently status post 2 cycles of mini R-CHOP.  Positive for hair loss.  Otherwise appetite is good.  Gaining weight.  No nausea no vomiting.  Positive for constipation.  No worsening tingling or numbness.  Review of Systems  Constitutional: Negative for chills, diaphoresis, fever and malaise/fatigue.  HENT: Negative for nosebleeds and sore throat.   Eyes: Negative for double vision.  Respiratory: Negative for cough, hemoptysis, sputum production, shortness of breath and wheezing.   Cardiovascular: Negative for chest pain, palpitations, orthopnea and leg swelling.  Gastrointestinal: Negative for abdominal pain, blood in stool, constipation, diarrhea, heartburn, melena, nausea and vomiting.  Genitourinary: Negative for dysuria, frequency and urgency.  Musculoskeletal: Positive for back pain and  joint pain.  Skin: Negative.  Negative for itching and rash.   Neurological: Negative for dizziness, tingling, focal weakness, weakness and headaches.  Endo/Heme/Allergies: Does not bruise/bleed easily.  Psychiatric/Behavioral: Positive for memory loss. Negative for depression. The patient is not nervous/anxious and does not have insomnia.      MEDICAL HISTORY:  Past Medical History:  Diagnosis Date  . Breast cancer ([place])   . Hypertension   . Personal history of chemotherapy   . Personal history of radiation therapy     SURGICAL HISTORY: Past Surgical History:  Procedure Laterality Date  . BREAST BIOPSY Right ?   needle bx-benign  . BREAST BIOPSY Left 05/12/2019   [place] bx 12:00, [place] marker, path pending  . BREAST EXCISIONAL BIOPSY Left 2003   lymphoma  . BREAST EXCISIONAL BIOPSY Left ?   lump was removed, benign  . IR IMAGING GUIDED PORT INSERTION  06/02/2019    SOCIAL HISTORY: Social History   Socioeconomic History  . Marital status: Single    Spouse name: Not on file  . Number of children: 2  . Years of education: Not on file  . Highest education level: Not on file  Occupational History  . Not on file  Social Needs  . Financial resource strain: Not hard at all  . Food insecurity    Worry: Never true    Inability: Never true  . Transportation needs    Medical: No    Non-medical: No  Tobacco Use  . Smoking status: Never Smoker  . Smokeless tobacco: Never Used  Substance and Sexual Activity  . Alcohol use: No  . Drug use: Not on file  . Sexual activity: Not on file  Lifestyle  . Physical activity    Days per week: 7 days    Minutes per session: 30 min  . Stress: Only a little  Relationships  . Social connections    Talks on phone: More than three times a week    Gets together: More than three times a week    Attends religious service: Not on file    Active member of club or organization: No    Attends meetings of clubs or organizations: Never    Relationship status: Not on file  . Intimate partner violence     Fear of current or ex partner: No    Emotionally abused: No    Physically abused: No    Forced sexual activity: No  Other Topics Concern  . Not on file  Social History Narrative   [place]; own apartment; no smoking; no alcohol. 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     FAMILY HISTORY: Family History  Problem Relation Age of Onset  . Breast cancer Neg Hx     ALLERGIES:  has No Known Allergies.  MEDICATIONS:  Current Outpatient Medications  Medication Sig Dispense Refill  . acetaminophen (TYLENOL) 500 MG tablet Take 500 mg by mouth every 6 (six) hours as needed.    Marland Kitchen amLODipine (NORVASC) 10 MG tablet Take 1 tablet by mouth daily.    . Calcium Carbonate-Vitamin D (CALTRATE 600+D PO) Take 1 tablet by mouth 2 (two) times daily.    Marland Kitchen docusate calcium (SURFAK) 240 MG capsule Take 1 capsule by mouth 2 (two) times daily.    Marland Kitchen donepezil (ARICEPT) 10 MG tablet Take 1 tablet by mouth at bedtime.    . fluticasone (FLONASE) 50 MCG/ACT nasal spray Place 2 sprays into both nostrils daily.    . hydrochlorothiazide (HYDRODIURIL) 25 MG tablet  Take 1 tablet by mouth daily.    . irbesartan (AVAPRO) 300 MG tablet Take 1 tablet by mouth daily.    Marland Kitchen lidocaine-prilocaine (EMLA) cream Apply to affected area once 30 g 3  . MELATONIN PO Take 1 tablet by mouth daily.    . meloxicam (MOBIC) 15 MG tablet Take 1 tablet by mouth as needed.    . Multiple Vitamin (MULTI-VITAMIN) tablet Take 1 tablet by mouth daily.    . Omega-3 Fatty Acids (FISH OIL) 1000 MG CAPS Take 1 tablet by mouth daily.    Marland Kitchen omeprazole (PRILOSEC) 20 MG capsule Take 1 capsule by mouth daily.    . ondansetron (ZOFRAN) 8 MG tablet Take 1 tablet (8 mg total) by mouth 2 (two) times daily as needed for refractory nausea / vomiting. 30 tablet 1  . predniSONE (DELTASONE) 20 MG tablet Take 3 tablets (60 mg total) by mouth daily with breakfast. Take on days 1-5 of chemotherapy. 60 tablet 1  . prochlorperazine (COMPAZINE) 10 MG tablet Take 1 tablet (10 mg total) by mouth  every 6 (six) hours as needed (Nausea or vomiting). 30 tablet 6  . traZODone (DESYREL) 50 MG tablet Take 1 tablet by mouth 2 (two) times daily at 8 am and 10 pm.     No current facility-administered medications for this visit.    Facility-Administered Medications Ordered in Other Visits  Medication Dose Route Frequency Provider Last Rate Last Dose  . heparin lock flush 100 unit/mL  500 Units Intravenous Once Faythe Casa E, NP      . heparin lock flush 100 unit/mL  500 Units Intracatheter Once PRN Charlaine Dalton R, MD      . sodium chloride flush (NS) 0.9 % injection 10 mL  10 mL Intravenous PRN Jacquelin Hawking, NP          .  PHYSICAL EXAMINATION: ECOG PERFORMANCE STATUS: 1 - Symptomatic but completely ambulatory  Vitals:   08/16/19 0845  BP: 140/64  Pulse: 81  Resp: 16  Temp: 98.3 F (36.8 C)  SpO2: 99%   Filed Weights   08/16/19 0845  Weight: 106 lb 9.6 oz (48.4 kg)    Physical Exam  Constitutional: She is oriented to person, place, and time and well-developed, well-nourished, and in no distress.  Accompanied by son.  She is walking herself.  HENT:  Head: Normocephalic and atraumatic.  Mouth/Throat: Oropharynx is clear and moist. No oropharyngeal exudate.  Eyes: Pupils are equal, round, and reactive to light.  Neck: Normal range of motion. Neck supple.  Cardiovascular: Normal rate and regular rhythm.  Pulmonary/Chest: Effort normal and breath sounds normal. No respiratory distress. She has no wheezes.  Abdominal: Soft. Bowel sounds are normal. She exhibits no distension and no mass. There is no abdominal tenderness. There is no rebound and no guarding.  Musculoskeletal: Normal range of motion.        General: No tenderness or edema.  Neurological: She is alert and oriented to person, place, and time.  Skin: Skin is warm.  Left breast upper inner quadrant- 2.5 x 3cm ( improved from basleine-5.5 x 4.5 cm).   Psychiatric: Affect normal.     LABORATORY  DATA:  I have reviewed the data as listed Lab Results  Component Value Date   WBC 10.3 08/16/2019   HGB 10.7 (L) 08/16/2019   HCT 32.6 (L) 08/16/2019   MCV 92.9 08/16/2019   PLT 346 08/16/2019   Recent Labs    07/05/19 0803  07/16/19 0910  07/26/19 0835 08/16/19 0809  NA 132*   < > 131* 135 132*  K 3.8   < > 4.2 3.9 4.1  CL 98   < > 96* 102 101  CO2 26   < > _0 GLUCOSE 116*   < > 109* 96 110*  BUN 17   < > _1 CREATININE 0.77   < > 0.83 0.71 0.63  CALCIUM 9.5   < > 9.5 9.4 8.9  GFRNONAA >60   < > >60 >60 >60  GFRAA >60   < > >60 >60 >60  PROT 7.0  --   --  6.7 6.7  ALBUMIN 4.1  --   --  3.9 3.9  AST 21  --   --  19 18  ALT 15  --   --  15 15  ALKPHOS 95  --   --  106 121  BILITOT 0.7  --   --  0.4 0.5   < > = values in this interval not displayed.    RADIOGRAPHIC STUDIES: I have personally reviewed the radiological images as listed and agreed with the findings in the report. No results found.  ASSESSMENT & PLAN:   Lymphoma of breast ([place]) #Diffuse large B-cell lymphoma left breast Non-GCB subtype; double expresser. Stage I E. S/p 2- R-miniCHOP appx 3 weeks ago.  Partial response.  # proceed with mini-RCHOP today #3; Labs today reviewed;  acceptable for treatment today. Will plan PET prior to next cycle; will plan a total of 4 cycles of chemo; also recommend radiation based upon  # Dementia: stable/continues to be fairly independent.  # constipation:new;  sec to chemo-miralax; increase fluid intake add Dulcolax as needed.  # I spoke at length with the patient's family/ Kim, son-regarding the patient's clinical status/plan of care.  Family agreement.   # DISPOSITION: # Proceed with chemo today; d-2 fulphilia # appx. 1 week labs-cbc/bmp # in 3 weeks- MD;labs-cbc/cmp/LDH. mini R-CHOP.; d-2 fluphilia; PET prior--dr.B    All questions were answered. The patient knows to call the clinic with any problems, questions or concerns.    Cammie Sickle, MD 08/16/2019 12:50 PM

## 2019-08-17 ENCOUNTER — Other Ambulatory Visit: Payer: Self-pay

## 2019-08-17 ENCOUNTER — Inpatient Hospital Stay: Payer: Medicare Other

## 2019-08-17 DIAGNOSIS — C8599 Non-Hodgkin lymphoma, unspecified, extranodal and solid organ sites: Secondary | ICD-10-CM

## 2019-08-17 DIAGNOSIS — Z5111 Encounter for antineoplastic chemotherapy: Secondary | ICD-10-CM | POA: Diagnosis not present

## 2019-08-17 MED ORDER — PEGFILGRASTIM-JMDB 6 MG/0.6ML ~~LOC~~ SOSY
6.0000 mg | PREFILLED_SYRINGE | Freq: Once | SUBCUTANEOUS | Status: AC
  Administered 2019-08-17: 6 mg via SUBCUTANEOUS
  Filled 2019-08-17: qty 0.6

## 2019-08-23 ENCOUNTER — Other Ambulatory Visit: Payer: Self-pay | Admitting: *Deleted

## 2019-08-23 ENCOUNTER — Inpatient Hospital Stay: Payer: Medicare Other

## 2019-08-23 MED ORDER — PREDNISONE 20 MG PO TABS: 60.0000 mg | ORAL_TABLET | Freq: Every day | ORAL | 3 refills | Status: DC

## 2019-08-23 NOTE — Telephone Encounter (Signed)
Daughter would like to cnl the lab apt for today and move apts to Monday. Daughter is sick and not feeling well. For this reason, she is not able to drive from [place] today for her mom's apt.  She is also inquiring if her mother needs RF on prednisone. If so, please submit script to her pharmacy.

## 2019-08-25 ENCOUNTER — Non-Acute Institutional Stay: Payer: Medicare Other | Admitting: Adult Health Nurse Practitioner

## 2019-08-25 ENCOUNTER — Other Ambulatory Visit: Payer: Self-pay

## 2019-08-25 DIAGNOSIS — Z515 Encounter for palliative care: Secondary | ICD-10-CM

## 2019-08-25 DIAGNOSIS — C8599 Non-Hodgkin lymphoma, unspecified, extranodal and solid organ sites: Secondary | ICD-10-CM

## 2019-08-25 NOTE — Progress Notes (Signed)
[facility] Consult Note Telephone: [phone]  Fax: [phone]  PATIENT NAME: Stephanie Davidson DOB: Sep 17, 1931 MRN: [id number]  PRIMARY CARE PROVIDER:   Derinda Late, MD  REFERRING PROVIDER:  Altha Harm NP  RESPONSIBLE PARTY:   Becky Sax, daughter [phone]    RECOMMENDATIONS and PLAN:  1.  Advanced care planning.  Patient is full code.  Does wish to have CPR but not prolonged CPR.  Wishes to have full hospital interventions but does not want to stay of ventilation or life support for a prolonged period.  Does state that her husband was on a ventilator for 6 weeks before passing away and states that it was too long to be on a ventilator.  Wants antibiotics and IV fluids as indicated and a feeding tube for a trial period.  MOST form filled out, uploaded to [facility] and original left in patient's home.  2.  Lymphoma of breast.  Patient has undergone 3 rounds of chemotherapy.  Will undergo 4th round on 09/06/2019.  She continues to do well with the only side effect being hair loss.  The tumor has not shrunk much due to scaling back on the dosage of the chemo to prevent a lot of side effects.  Overall seeing some reduction in tumor size.  Plans are to undergo radiation after chemo.  Also has PET scan scheduled for 09/01/2019.  Patient still taking walks everyday, do light house cleaning, and perform self care.  Her medications including her prednisone are now pre sorted in a pill box and are brought to her weekly.  She seems to be doing well with this and reminder notes left by her daughter.  Her appetite is good and she has gained some weight.  Denies pain, SOB, cough, fever, chest pain, N/V/D, constipation, headaches, dizziness. Continue recommendations and follow up by oncology.  No reported falls, infections, or hospital visits since last visit.  Palliative will continue to monitor for symptom management and make recommendations as  needed.  I spent 50 minutes providing this consultation,  from 9:00 to 9:50. More than 50% of the time in this consultation was spent coordinating communication.   HISTORY OF PRESENT ILLNESS:  Stephanie Davidson is a 6 y.o. year old female with multiple medical problems including lymphoma of breast, early alzheimer's dementia, HTN. Palliative Care was asked to help address goals of care.   CODE STATUS: full code  PPS: 60% HOSPICE ELIGIBILITY/DIAGNOSIS: TBD  PHYSICAL EXAM:   General: NAD, frail appearing, thin Extremities: no edema, no joint deformities Neurological: Weakness but otherwise nonfocal; does have some forgetfulness related to early Alzheimer's dementia  PAST MEDICAL HISTORY:  Past Medical History:  Diagnosis Date  . Breast cancer ([place])   . Hypertension   . Personal history of chemotherapy   . Personal history of radiation therapy     SOCIAL HX:  Social History   Tobacco Use  . Smoking status: Never Smoker  . Smokeless tobacco: Never Used  Substance Use Topics  . Alcohol use: No    ALLERGIES: No Known Allergies   PERTINENT MEDICATIONS:  Outpatient Encounter Medications as of 08/25/2019  Medication Sig  . acetaminophen (TYLENOL) 500 MG tablet Take 500 mg by mouth every 6 (six) hours as needed.  Marland Kitchen amLODipine (NORVASC) 10 MG tablet Take 1 tablet by mouth daily.  . Calcium Carbonate-Vitamin D (CALTRATE 600+D PO) Take 1 tablet by mouth 2 (two) times daily.  Marland Kitchen docusate calcium (SURFAK) 240 MG capsule  Take 1 capsule by mouth 2 (two) times daily.  Marland Kitchen donepezil (ARICEPT) 10 MG tablet Take 1 tablet by mouth at bedtime.  . fluticasone (FLONASE) 50 MCG/ACT nasal spray Place 2 sprays into both nostrils daily.  . hydrochlorothiazide (HYDRODIURIL) 25 MG tablet Take 1 tablet by mouth daily.  . irbesartan (AVAPRO) 300 MG tablet Take 1 tablet by mouth daily.  Marland Kitchen lidocaine-prilocaine (EMLA) cream Apply to affected area once  . MELATONIN PO Take 1 tablet by mouth daily.  . meloxicam  (MOBIC) 15 MG tablet Take 1 tablet by mouth as needed.  . Multiple Vitamin (MULTI-VITAMIN) tablet Take 1 tablet by mouth daily.  . Omega-3 Fatty Acids (FISH OIL) 1000 MG CAPS Take 1 tablet by mouth daily.  Marland Kitchen omeprazole (PRILOSEC) 20 MG capsule Take 1 capsule by mouth daily.  . ondansetron (ZOFRAN) 8 MG tablet Take 1 tablet (8 mg total) by mouth 2 (two) times daily as needed for refractory nausea / vomiting.  . predniSONE (DELTASONE) 20 MG tablet Take 3 tablets (60 mg total) by mouth daily with breakfast. Take on days 1-5 of chemotherapy.  . prochlorperazine (COMPAZINE) 10 MG tablet Take 1 tablet (10 mg total) by mouth every 6 (six) hours as needed (Nausea or vomiting).  . traZODone (DESYREL) 50 MG tablet Take 1 tablet by mouth 2 (two) times daily at 8 am and 10 pm.   Facility-Administered Encounter Medications as of 08/25/2019  Medication  . heparin lock flush 100 unit/mL  . sodium chloride flush (NS) 0.9 % injection 10 mL     Jonae Renshaw Jenetta Downer, NP

## 2019-08-26 ENCOUNTER — Other Ambulatory Visit: Payer: Self-pay

## 2019-08-27 ENCOUNTER — Inpatient Hospital Stay: Payer: Medicare Other | Attending: Internal Medicine

## 2019-08-27 ENCOUNTER — Other Ambulatory Visit: Payer: Self-pay

## 2019-08-27 DIAGNOSIS — Z79899 Other long term (current) drug therapy: Secondary | ICD-10-CM | POA: Insufficient documentation

## 2019-08-27 DIAGNOSIS — F0390 Unspecified dementia without behavioral disturbance: Secondary | ICD-10-CM | POA: Insufficient documentation

## 2019-08-27 DIAGNOSIS — Z5111 Encounter for antineoplastic chemotherapy: Secondary | ICD-10-CM | POA: Diagnosis not present

## 2019-08-27 DIAGNOSIS — I10 Essential (primary) hypertension: Secondary | ICD-10-CM | POA: Insufficient documentation

## 2019-08-27 DIAGNOSIS — C8339 Diffuse large B-cell lymphoma, extranodal and solid organ sites: Secondary | ICD-10-CM | POA: Diagnosis present

## 2019-08-27 DIAGNOSIS — K5900 Constipation, unspecified: Secondary | ICD-10-CM | POA: Diagnosis not present

## 2019-08-27 DIAGNOSIS — C8599 Non-Hodgkin lymphoma, unspecified, extranodal and solid organ sites: Secondary | ICD-10-CM

## 2019-08-27 LAB — CBC WITH DIFFERENTIAL/PLATELET
Abs Immature Granulocytes: 0.8 10*3/uL — ABNORMAL HIGH (ref 0.00–0.07)
Basophils Absolute: 0.1 10*3/uL (ref 0.0–0.1)
Basophils Relative: 1 %
Eosinophils Absolute: 0.2 10*3/uL (ref 0.0–0.5)
Eosinophils Relative: 1 %
HCT: 33.9 % — ABNORMAL LOW (ref 36.0–46.0)
Hemoglobin: 10.8 g/dL — ABNORMAL LOW (ref 12.0–15.0)
Immature Granulocytes: 5 %
Lymphocytes Relative: 6 %
Lymphs Abs: 1 10*3/uL (ref 0.7–4.0)
MCH: 30.3 pg (ref 26.0–34.0)
MCHC: 31.9 g/dL (ref 30.0–36.0)
MCV: 95.2 fL (ref 80.0–100.0)
Monocytes Absolute: 1 10*3/uL (ref 0.1–1.0)
Monocytes Relative: 6 %
Neutro Abs: 12.8 10*3/uL — ABNORMAL HIGH (ref 1.7–7.7)
Neutrophils Relative %: 81 %
Platelets: 255 10*3/uL (ref 150–400)
RBC: 3.56 MIL/uL — ABNORMAL LOW (ref 3.87–5.11)
RDW: 15.8 % — ABNORMAL HIGH (ref 11.5–15.5)
WBC: 15.9 10*3/uL — ABNORMAL HIGH (ref 4.0–10.5)
nRBC: 0 % (ref 0.0–0.2)

## 2019-08-27 LAB — BASIC METABOLIC PANEL
Anion gap: 9 (ref 5–15)
BUN: 18 mg/dL (ref 8–23)
CO2: 25 mmol/L (ref 22–32)
Calcium: 9.2 mg/dL (ref 8.9–10.3)
Chloride: 99 mmol/L (ref 98–111)
Creatinine, Ser: 0.72 mg/dL (ref 0.44–1.00)
GFR calc Af Amer: >60 mL/min (ref >60)
GFR calc non Af Amer: >60 mL/min (ref >60)
Glucose, Bld: 114 mg/dL — ABNORMAL HIGH (ref 70–99)
Potassium: 3.8 mmol/L (ref 3.5–5.1)
Sodium: 133 mmol/L — ABNORMAL LOW (ref 135–145)

## 2019-09-01 ENCOUNTER — Other Ambulatory Visit: Payer: Self-pay

## 2019-09-01 ENCOUNTER — Ambulatory Visit
Admission: RE | Admit: 2019-09-01 | Discharge: 2019-09-01 | Disposition: A | Discharge status: Home / Self Care | Payer: Medicare Other | Source: Ambulatory Visit | Attending: Internal Medicine | Admitting: Internal Medicine

## 2019-09-01 DIAGNOSIS — C8599 Non-Hodgkin lymphoma, unspecified, extranodal and solid organ sites: Secondary | ICD-10-CM

## 2019-09-02 ENCOUNTER — Encounter: Payer: Self-pay | Admitting: Internal Medicine

## 2019-09-03 ENCOUNTER — Encounter: Payer: Self-pay | Admitting: Internal Medicine

## 2019-09-03 ENCOUNTER — Other Ambulatory Visit: Payer: Self-pay

## 2019-09-03 NOTE — Progress Notes (Signed)
Patient is her for a follow-up with no concerns.

## 2019-09-06 ENCOUNTER — Other Ambulatory Visit: Payer: Self-pay

## 2019-09-06 ENCOUNTER — Inpatient Hospital Stay (HOSPITAL_BASED_OUTPATIENT_CLINIC_OR_DEPARTMENT_OTHER): Payer: Medicare Other | Admitting: Hospice and Palliative Medicine

## 2019-09-06 ENCOUNTER — Inpatient Hospital Stay: Payer: Medicare Other

## 2019-09-06 ENCOUNTER — Inpatient Hospital Stay (HOSPITAL_BASED_OUTPATIENT_CLINIC_OR_DEPARTMENT_OTHER): Payer: Medicare Other | Admitting: Internal Medicine

## 2019-09-06 VITALS — BP 110/57 | HR 76 | Temp 96.7°F | Resp 17

## 2019-09-06 DIAGNOSIS — C8599 Non-Hodgkin lymphoma, unspecified, extranodal and solid organ sites: Secondary | ICD-10-CM

## 2019-09-06 DIAGNOSIS — Z515 Encounter for palliative care: Secondary | ICD-10-CM | POA: Diagnosis not present

## 2019-09-06 DIAGNOSIS — Z5111 Encounter for antineoplastic chemotherapy: Secondary | ICD-10-CM | POA: Diagnosis not present

## 2019-09-06 LAB — CBC WITH DIFFERENTIAL/PLATELET
Abs Immature Granulocytes: 0.06 10*3/uL (ref 0.00–0.07)
Basophils Absolute: 0 10*3/uL (ref 0.0–0.1)
Basophils Relative: 1 %
Eosinophils Absolute: 0 10*3/uL (ref 0.0–0.5)
Eosinophils Relative: 0 %
HCT: 33.8 % — ABNORMAL LOW (ref 36.0–46.0)
Hemoglobin: 10.8 g/dL — ABNORMAL LOW (ref 12.0–15.0)
Immature Granulocytes: 1 %
Lymphocytes Relative: 5 %
Lymphs Abs: 0.4 10*3/uL — ABNORMAL LOW (ref 0.7–4.0)
MCH: 30.1 pg (ref 26.0–34.0)
MCHC: 32 g/dL (ref 30.0–36.0)
MCV: 94.2 fL (ref 80.0–100.0)
Monocytes Absolute: 0.1 10*3/uL (ref 0.1–1.0)
Monocytes Relative: 1 %
Neutro Abs: 8.1 10*3/uL — ABNORMAL HIGH (ref 1.7–7.7)
Neutrophils Relative %: 92 %
Platelets: 344 10*3/uL (ref 150–400)
RBC: 3.59 MIL/uL — ABNORMAL LOW (ref 3.87–5.11)
RDW: 15.9 % — ABNORMAL HIGH (ref 11.5–15.5)
WBC: 8.7 10*3/uL (ref 4.0–10.5)
nRBC: 0 % (ref 0.0–0.2)

## 2019-09-06 LAB — COMPREHENSIVE METABOLIC PANEL
ALT: 17 U/L (ref 0–44)
AST: 19 U/L (ref 15–41)
Albumin: 4 g/dL (ref 3.5–5.0)
Alkaline Phosphatase: 118 U/L (ref 38–126)
Anion gap: 9 (ref 5–15)
BUN: 25 mg/dL — ABNORMAL HIGH (ref 8–23)
CO2: 24 mmol/L (ref 22–32)
Calcium: 9.3 mg/dL (ref 8.9–10.3)
Chloride: 102 mmol/L (ref 98–111)
Creatinine, Ser: 0.77 mg/dL (ref 0.44–1.00)
GFR calc Af Amer: >60 mL/min (ref >60)
GFR calc non Af Amer: >60 mL/min (ref >60)
Glucose, Bld: 121 mg/dL — ABNORMAL HIGH (ref 70–99)
Potassium: 3.7 mmol/L (ref 3.5–5.1)
Sodium: 135 mmol/L (ref 135–145)
Total Bilirubin: 0.7 mg/dL (ref 0.3–1.2)
Total Protein: 6.8 g/dL (ref 6.5–8.1)

## 2019-09-06 LAB — LACTATE DEHYDROGENASE: LDH: 193 U/L — ABNORMAL HIGH (ref 98–192)

## 2019-09-06 MED ORDER — PALONOSETRON HCL INJECTION 0.25 MG/5ML
0.2500 mg | Freq: Once | INTRAVENOUS | Status: AC
  Administered 2019-09-06: 0.25 mg via INTRAVENOUS
  Filled 2019-09-06: qty 5

## 2019-09-06 MED ORDER — SODIUM CHLORIDE 0.9 % IV SOLN
Freq: Once | INTRAVENOUS | Status: AC
  Administered 2019-09-06: 10:00:00 via INTRAVENOUS
  Filled 2019-09-06: qty 5

## 2019-09-06 MED ORDER — DOXORUBICIN HCL CHEMO IV INJECTION 2 MG/ML
25.0000 mg/m2 | Freq: Once | INTRAVENOUS | Status: AC
  Administered 2019-09-06: 10:00:00 36 mg via INTRAVENOUS
  Filled 2019-09-06: qty 18

## 2019-09-06 MED ORDER — SODIUM CHLORIDE 0.9 % IV SOLN
Freq: Once | INTRAVENOUS | Status: AC
  Administered 2019-09-06: 10:00:00 via INTRAVENOUS
  Filled 2019-09-06: qty 250

## 2019-09-06 MED ORDER — HEPARIN SOD (PORK) LOCK FLUSH 100 UNIT/ML IV SOLN
500.0000 [IU] | Freq: Once | INTRAVENOUS | Status: AC
  Administered 2019-09-06: 14:00:00 500 [IU] via INTRAVENOUS
  Filled 2019-09-06: qty 5

## 2019-09-06 MED ORDER — SODIUM CHLORIDE 0.9 % IV SOLN
400.0000 mg/m2 | Freq: Once | INTRAVENOUS | Status: AC
  Administered 2019-09-06: 11:00:00 580 mg via INTRAVENOUS
  Filled 2019-09-06: qty 29

## 2019-09-06 MED ORDER — SODIUM CHLORIDE 0.9% FLUSH
10.0000 mL | Freq: Once | INTRAVENOUS | Status: AC
  Administered 2019-09-06: 08:00:00 10 mL via INTRAVENOUS
  Filled 2019-09-06: qty 10

## 2019-09-06 MED ORDER — ACETAMINOPHEN 325 MG PO TABS
650.0000 mg | ORAL_TABLET | Freq: Once | ORAL | Status: AC
  Administered 2019-09-06: 10:00:00 650 mg via ORAL
  Filled 2019-09-06: qty 2

## 2019-09-06 MED ORDER — SODIUM CHLORIDE 0.9 % IV SOLN
375.0000 mg/m2 | Freq: Once | INTRAVENOUS | Status: AC
  Administered 2019-09-06: 12:00:00 500 mg via INTRAVENOUS
  Filled 2019-09-06: qty 50

## 2019-09-06 MED ORDER — VINCRISTINE SULFATE CHEMO INJECTION 1 MG/ML
2.0000 mg | Freq: Once | INTRAVENOUS | Status: AC
  Administered 2019-09-06: 2 mg via INTRAVENOUS
  Filled 2019-09-06: qty 2

## 2019-09-06 MED ORDER — DIPHENHYDRAMINE HCL 25 MG PO CAPS
50.0000 mg | ORAL_CAPSULE | Freq: Once | ORAL | Status: AC
  Administered 2019-09-06: 10:00:00 50 mg via ORAL
  Filled 2019-09-06: qty 2

## 2019-09-06 NOTE — Progress Notes (Signed)
[facility] CONSULT NOTE  Patient Care Team: Derinda Late, MD as PCP - General (Family Medicine)  CHIEF COMPLAINTS/PURPOSE OF CONSULTATION: Breast lymphoma   Oncology History Overview Note  # AUG 2020- LEFT BREAST DLBCL [non-GCB subtype; Double expressor;NEG- CD-30; Ki-67-80-90%]; FISH -negative for translocations; SEP 2020- PET-left breast intense uptake; no distant disease noted.  Hold off bone marrow biopsy [patient/family preference not to be aggressive]; Stage adjusted IPI-"2 points" cure rate 50-60%.   # 9/21- Pred+Rituxan; OCT 2020- mini-R-CHOP -------------------------------------------------------------------------------------------- # 2006- DLBCL of Breast [s? 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    ;GOALS: Cure  CURRENT/MOST RECENT THERAPY:mini-RCHOP    Lymphoma of breast ([place])  05/25/2019 Initial Diagnosis   Lymphoma of breast ([place])   06/14/2019 -  Chemotherapy   The patient had DOXOrubicin (ADRIAMYCIN) chemo injection 36 mg, 25 mg/m2 = 36 mg (100 % of original dose 25 mg/m2), Intravenous,  Once, 3 of 4 cycles Dose modification: 25 mg/m2 (original dose 25 mg/m2, Cycle 2, Reason: Provider Judgment) Administration: 36 mg (07/05/2019), 36 mg (08/16/2019), 36 mg (07/26/2019) palonosetron (ALOXI) injection 0.25 mg, 0.25 mg, Intravenous,  Once, 3 of 4 cycles Administration: 0.25 mg (07/05/2019), 0.25 mg (08/16/2019), 0.25 mg (07/26/2019) pegfilgrastim-jmdb (FULPHILA) injection 6 mg, 6 mg, Subcutaneous,  Once, 3 of 4 cycles Administration: 6 mg (07/06/2019), 6 mg (08/17/2019), 6 mg (07/27/2019) vinCRIStine (ONCOVIN) 2 mg in sodium chloride 0.9 % 50 mL chemo infusion, 2 mg, Intravenous,  Once, 3 of 4 cycles Administration: 2 mg (07/05/2019), 2 mg (08/16/2019), 2 mg (07/26/2019) riTUXimab (RITUXAN) 500 mg in sodium chloride 0.9 %  250 mL (1.6667 mg/mL) infusion, 375 mg/m2 = 500 mg, Intravenous,  Once, 1 of 1 cycle Administration: 500 mg (06/14/2019) cyclophosphamide (CYTOXAN) 580 mg in sodium chloride 0.9 % 250 mL chemo infusion, 400 mg/m2 = 580 mg (100 % of original dose 400 mg/m2), Intravenous,  Once, 3 of 4 cycles Dose modification: 600 mg/m2 (original dose 400 mg/m2, Cycle 2, Reason: Provider Judgment), 400 mg/m2 (original dose 400 mg/m2, Cycle 2, Reason: Provider Judgment) Administration: 580 mg (07/05/2019), 580 mg (08/16/2019), 580 mg (07/26/2019) fosaprepitant (EMEND) 150 mg, dexamethasone (DECADRON) 12 mg in sodium chloride 0.9 % 145 mL IVPB, , Intravenous,  Once, 3 of 4 cycles Administration:  (07/05/2019),  (08/16/2019),  (07/26/2019) riTUXimab-pvvr (RUXIENCE) 500 mg in sodium chloride 0.9 % 250 mL (1.6667 mg/mL) infusion, 375 mg/m2 = 500 mg (100 % of original dose 375 mg/m2), Intravenous,  Once, 1 of 1 cycle Dose modification: 375 mg/m2 (original dose 375 mg/m2, Cycle 2, Reason: Other (see comments), Comment: insurance)  for chemotherapy treatment.       HISTORY OF PRESENTING ILLNESS:  Stephanie Davidson 85 y.o.  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 Patient currently status post 3 cycles of mini R-CHOP.   Positive for hair loss.  Otherwise appetite is good.  Weight stable.  No nausea no vomiting.  Positive for constipation better on MiraLAX.  No worsening tingling or numbness.  Review of Systems  Constitutional: Negative for chills, diaphoresis, fever and malaise/fatigue.  HENT: Negative for nosebleeds and sore throat.   Eyes: Negative for double vision.  Respiratory: Negative for cough, hemoptysis,  sputum production, shortness of breath and wheezing.   Cardiovascular: Negative for chest pain, palpitations, orthopnea and leg swelling.   Gastrointestinal: Negative for abdominal pain, blood in stool, constipation, diarrhea, heartburn, melena, nausea and vomiting.  Genitourinary: Negative for dysuria, frequency and urgency.  Musculoskeletal: Positive for back pain and joint pain.  Skin: Negative.  Negative for itching and rash.  Neurological: Negative for dizziness, tingling, focal weakness, weakness and headaches.  Endo/Heme/Allergies: Does not bruise/bleed easily.  Psychiatric/Behavioral: Positive for memory loss. Negative for depression. The patient is not nervous/anxious and does not have insomnia.      MEDICAL HISTORY:  Past Medical History:  Diagnosis Date  . Breast cancer ([place])   . Hypertension   . Personal history of chemotherapy   . Personal history of radiation therapy     SURGICAL HISTORY: Past Surgical History:  Procedure Laterality Date  . BREAST BIOPSY Right ?   needle bx-benign  . BREAST BIOPSY Left 05/12/2019   [place] bx 12:00, venus marker, path pending  . BREAST EXCISIONAL BIOPSY Left 2003   lymphoma  . BREAST EXCISIONAL BIOPSY Left ?   lump was removed, benign  . IR IMAGING GUIDED PORT INSERTION  06/02/2019    SOCIAL HISTORY: Social History   Socioeconomic History  . Marital status: Single    Spouse name: Not on file  . Number of children: 2  . Years of education: Not on file  . Highest education level: Not on file  Occupational History  . Not on file  Tobacco Use  . Smoking status: Never Smoker  . Smokeless tobacco: Never Used  Substance and Sexual Activity  . Alcohol use: No  . Drug use: Not on file  . Sexual activity: Not on file  Other Topics Concern  . Not on file  Social History Narrative   [facility]; own apartment; no smoking; no alcohol. 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    Social Determinants of Health   Financial Resource Strain: Low Risk   . Difficulty of Paying Living Expenses: Not hard at all  Food Insecurity: No Food Insecurity  . Worried About Charity fundraiser in the Last  Year: Never true  . Ran Out of Food in the Last Year: Never true  Transportation Needs: No Transportation Needs  . Lack of Transportation (Medical): No  . Lack of Transportation (Non-Medical): No  Physical Activity: Sufficiently Active  . Days of Exercise per Week: 7 days  . Minutes of Exercise per Session: 30 min  Stress: No Stress Concern Present  . Feeling of Stress : Only a little  Social Connections: Unknown  . Frequency of Communication with Friends and Family: More than three times a week  . Frequency of Social Gatherings with Friends and Family: More than three times a week  . Attends Religious Services: Not on file  . Active Member of Clubs or Organizations: No  . Attends Archivist Meetings: Never  . Marital Status: Not on file  Intimate Partner Violence: Not At Risk  . Fear of Current or Ex-Partner: No  . Emotionally Abused: No  . Physically Abused: No  . Sexually Abused: No    FAMILY HISTORY: Family History  Problem Relation Age of Onset  . Breast cancer Neg Hx     ALLERGIES:  has No Known Allergies.  MEDICATIONS:  Current Outpatient Medications  Medication Sig Dispense Refill  . amLODipine (NORVASC) 10 MG tablet Take 1 tablet by mouth daily.    . Calcium Carbonate-Vitamin D (CALTRATE  600+D PO) Take 1 tablet by mouth 2 (two) times daily.    Marland Kitchen docusate calcium (SURFAK) 240 MG capsule Take 1 capsule by mouth 2 (two) times daily.    Marland Kitchen donepezil (ARICEPT) 10 MG tablet Take 1 tablet by mouth at bedtime.    . fluticasone (FLONASE) 50 MCG/ACT nasal spray Place 2 sprays into both nostrils daily.    . hydrochlorothiazide (HYDRODIURIL) 25 MG tablet Take 1 tablet by mouth daily.    . irbesartan (AVAPRO) 300 MG tablet Take 1 tablet by mouth daily.    Marland Kitchen lidocaine-prilocaine (EMLA) cream Apply to affected area once 30 g 3  . MELATONIN PO Take 1 tablet by mouth daily.    . meloxicam (MOBIC) 15 MG tablet Take 1 tablet by mouth as needed.    . predniSONE (DELTASONE)  20 MG tablet Take 3 tablets (60 mg total) by mouth daily with breakfast. Take on days 1-5 of chemotherapy. 60 tablet 3  . traZODone (DESYREL) 50 MG tablet Take 1 tablet by mouth 2 (two) times daily at 8 am and 10 pm.    . acetaminophen (TYLENOL) 500 MG tablet Take 500 mg by mouth every 6 (six) hours as needed.    . Multiple Vitamin (MULTI-VITAMIN) tablet Take 1 tablet by mouth daily.    . Omega-3 Fatty Acids (FISH OIL) 1000 MG CAPS Take 1 tablet by mouth daily.    Marland Kitchen omeprazole (PRILOSEC) 20 MG capsule Take 1 capsule by mouth daily.    . ondansetron (ZOFRAN) 8 MG tablet Take 1 tablet (8 mg total) by mouth 2 (two) times daily as needed for refractory nausea / vomiting. (Patient not taking: Reported on 09/03/2019) 30 tablet 1  . prochlorperazine (COMPAZINE) 10 MG tablet Take 1 tablet (10 mg total) by mouth every 6 (six) hours as needed (Nausea or vomiting). (Patient not taking: Reported on 09/03/2019) 30 tablet 6   No current facility-administered medications for this visit.   Facility-Administered Medications Ordered in Other Visits  Medication Dose Route Frequency Provider Last Rate Last Admin  . heparin lock flush 100 unit/mL  500 Units Intravenous Once Faythe Casa E, NP      . heparin lock flush 100 unit/mL  500 Units Intravenous Once Charlaine Dalton R, MD      . sodium chloride flush (NS) 0.9 % injection 10 mL  10 mL Intravenous PRN Jacquelin Hawking, NP          .  PHYSICAL EXAMINATION: ECOG PERFORMANCE STATUS: 1 - Symptomatic but completely ambulatory  Vitals:   09/06/19 0842  BP: (!) 131/58  Pulse: 85  Temp: (!) 96.9 F (36.1 C)   Filed Weights   09/06/19 0842  Weight: 105 lb 9.6 oz (47.9 kg)    Physical Exam  Constitutional: She is oriented to person, place, and time and well-developed, well-nourished, and in no distress.  Accompanied bydaughter.  She is walking herself.  HENT:  Head: Normocephalic and atraumatic.  Mouth/Throat: Oropharynx is clear and moist. No  oropharyngeal exudate.  Eyes: Pupils are equal, round, and reactive to light.  Cardiovascular: Normal rate and regular rhythm.  Pulmonary/Chest: Effort normal and breath sounds normal. No respiratory distress. She has no wheezes.  Abdominal: Soft. Bowel sounds are normal. She exhibits no distension and no mass. There is no abdominal tenderness. There is no rebound and no guarding.  Musculoskeletal:        General: No tenderness or edema. Normal range of motion.     Cervical back: Normal range  of motion and neck supple.  Neurological: She is alert and oriented to person, place, and time.  Skin: Skin is warm.  Left breast upper inner quadrant- ~2.5cm ( improved from basleine-5.5 x 4.5 cm).   Psychiatric: Affect normal.     LABORATORY DATA:  I have reviewed the data as listed Lab Results  Component Value Date   WBC 8.7 09/06/2019   HGB 10.8 (L) 09/06/2019   HCT 33.8 (L) 09/06/2019   MCV 94.2 09/06/2019   PLT 344 09/06/2019   Recent Labs    07/26/19 0835 08/16/19 0809 08/27/19 1058 09/06/19 0819  NA 135 132* 133* 135  K 3.9 4.1 3.8 3.7  CL 102 101 99 102  CO2 '24 25 25 24  ' GLUCOSE 96 110* 114* 121*  BUN '15 19 18 ' 25*  CREATININE 0.71 0.63 0.72 0.77  CALCIUM 9.4 8.9 9.2 9.3  GFRNONAA >60 >60 >60 >60  GFRAA >60 >60 >60 >60  PROT 6.7 6.7  --  6.8  ALBUMIN 3.9 3.9  --  4.0  AST 19 18  --  19  ALT 15 15  --  17  ALKPHOS 106 121  --  118  BILITOT 0.4 0.5  --  0.7    RADIOGRAPHIC STUDIES: I have personally reviewed the radiological images as listed and agreed with the findings in the report. No results found.  ASSESSMENT & PLAN:   Lymphoma of breast ([place]) #Diffuse large B-cell lymphoma left breast Non-GCB subtype; double expresser. Stage I E. S/p 3- R-miniCHOP appx 3 weeks ago.  Partial response.  # proceed with mini-RCHOP today # 4; Labs today reviewed;  acceptable for treatment today. Will get PET scan in 4 weeks.  Discussed that this should be her last cycle of  chemotherapy.  Would recommend radiation based upon the results of the PET scan; however patient is likely to get radiation therapy than not.  # Dementia: STABLE; continues to be fairly independent.  # Constipation: sec to chemo- on miralax;- STABLE.   # I spoke at length with the patient's family/daughter, son-regarding the patient's clinical status/plan of care.  Family agreement.   # DISPOSITION: # Proceed with chemo today; d-2 fulphilia # in 73month MD;labs-cbc/cmp/LDH; PET prior--dr.B.    All questions were answered. The patient knows to call the clinic with any problems, questions or concerns.    GCammie Sickle MD 09/06/2019 9:35 AM

## 2019-09-06 NOTE — Progress Notes (Signed)
Nutrition Follow-up:  Patient with large B-cell lymphoma of the breast.  Patient receiving chemotherapy and anticipate today being last treatment.   Met with patient during infusion today.  Patient reports that her appetite is good.  Patient continues to order 1 meal per day as she lives at [facility].  Likes to eat quiche or sausage balls for breakfast.  Cooked scrambled eggs and sausage for breakfast this am.  Does not drink oral nutrition supplements but reports that she has one in her refrigerator that she has not tried yet.  Denies nausea or nutrition impact symptoms.   Medications: reviewed  Labs: reviewed  Anthropometrics:   Weight stable at 105 lb 9.6 oz today.  106 lb on 11/23.  108 lb on 9/29  NUTRITION DIAGNOSIS: Increased nutrient needs continue    INTERVENTION:  Recommend patient try oral nutrition supplement. Could benefit from additional calories and protein.   Reviewed strategies to help increase calories and protein.  Handout given on 11/23 visit to patient.      MONITORING, EVALUATION, GOAL: Patient will consume adequate calories and protein to prevent weight loss   NEXT VISIT: Jan 11 after MD appointment  Aundre Hietala B. Zenia Resides, [place], [place] Registered Dietitian [phone] (pager)

## 2019-09-06 NOTE — Progress Notes (Signed)
[facility]  Telephone:([phone] [phone]   Name: Stephanie Davidson Date: 09/06/2019 MRN: [id number]  DOB: October 09, 1930  Patient Care Team: Derinda Late, MD as PCP - General (Family Medicine)    REASON FOR CONSULTATION: Palliative Care consult requested for this 85 y.o. 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  Patient had severe symptoms from previous treatment and was initially reluctant to pursue further cancer treatment.  She agreed with mini-RCHOP. Patient was referred to palliative care to help address goals and provide with support.  SOCIAL HISTORY:     reports that she has never smoked. She has never used smokeless tobacco. She reports that she does not drink alcohol.   Patient is widowed.  She lives at [facility] in independent living.  She has a son and [place] and a daughter in [place].  ADVANCE DIRECTIVES:  Not on file.  Her son, [name], is her healthcare power of attorney and her daughter, [name], is her financial POA.  Patient reportedly has a living will.  CODE STATUS:   PAST MEDICAL HISTORY: Past Medical History:  Diagnosis Date  . Breast cancer ([place])   . Hypertension   . Personal history of chemotherapy   . Personal history of radiation therapy     PAST SURGICAL HISTORY:  Past Surgical History:  Procedure Laterality Date  . BREAST BIOPSY Right ?   needle bx-benign  . BREAST BIOPSY Left 05/12/2019   [place] bx 12:00, venus marker, path pending  . BREAST EXCISIONAL BIOPSY Left 2003   lymphoma  . BREAST EXCISIONAL BIOPSY Left ?   lump was removed, benign  . IR IMAGING GUIDED PORT INSERTION  06/02/2019    HEMATOLOGY/ONCOLOGY HISTORY:  Oncology History Overview Note  # AUG 2020- LEFT BREAST DLBCL [non-GCB subtype; Double expressor;NEG- CD-30; Ki-67-80-90%]; FISH -negative  for translocations; SEP 2020- PET-left breast intense uptake; no distant disease noted.  Hold off bone marrow biopsy [patient/family preference not to be aggressive]; Stage adjusted IPI-"2 points" cure rate 50-60%.   # 9/21- Pred+Rituxan; OCT 2020- mini-R-CHOP -------------------------------------------------------------------------------------------- # 2006- DLBCL of Breast [s? 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    ;GOALS: Cure  CURRENT/MOST RECENT THERAPY:mini-RCHOP    Lymphoma of breast ([place])  05/25/2019 Initial Diagnosis   Lymphoma of breast ([place])   06/14/2019 -  Chemotherapy   The patient had DOXOrubicin (ADRIAMYCIN) chemo injection 36 mg, 25 mg/m2 = 36 mg (100 % of original dose 25 mg/m2), Intravenous,  Once, 4 of 4 cycles Dose modification: 25 mg/m2 (original dose 25 mg/m2, Cycle 2, Reason: Provider Judgment) Administration: 36 mg (07/05/2019), 36 mg (08/16/2019), 36 mg (07/26/2019) palonosetron (ALOXI) injection 0.25 mg, 0.25 mg, Intravenous,  Once, 4 of 4 cycles Administration: 0.25 mg (07/05/2019), 0.25 mg (08/16/2019), 0.25 mg (07/26/2019) pegfilgrastim-jmdb (FULPHILA) injection 6 mg, 6 mg, Subcutaneous,  Once, 4 of 4 cycles Administration: 6 mg (07/06/2019), 6 mg (08/17/2019), 6 mg (07/27/2019) vinCRIStine (ONCOVIN) 2 mg in sodium chloride 0.9 % 50 mL chemo infusion, 2 mg, Intravenous,  Once, 4 of 4 cycles Administration: 2 mg (07/05/2019), 2 mg (08/16/2019), 2 mg (07/26/2019) riTUXimab (RITUXAN) 500 mg in sodium chloride 0.9 % 250 mL (1.6667 mg/mL) infusion, 375 mg/m2 = 500 mg, Intravenous,  Once, 1 of 1 cycle Administration: 500 mg (06/14/2019) cyclophosphamide (CYTOXAN) 580 mg in sodium chloride 0.9 % 250 mL chemo infusion, 400 mg/m2 = 580 mg (100 % of original dose 400 mg/m2), Intravenous,  Once, 4 of 4 cycles Dose modification: 600 mg/m2  (original dose 400 mg/m2, Cycle 2, Reason: Provider Judgment), 400 mg/m2 (original dose 400 mg/m2, Cycle 2, Reason: Provider Judgment) Administration: 580 mg (07/05/2019), 580 mg (08/16/2019), 580 mg (07/26/2019) fosaprepitant (EMEND) 150 mg, dexamethasone (DECADRON) 12 mg in sodium chloride 0.9 % 145 mL IVPB, , Intravenous,  Once, 4 of 4 cycles Administration:  (07/05/2019),  (08/16/2019),  (07/26/2019) riTUXimab-pvvr (RUXIENCE) 500 mg in sodium chloride 0.9 % 250 mL (1.6667 mg/mL) infusion, 375 mg/m2 = 500 mg (100 % of original dose 375 mg/m2), Intravenous,  Once, 1 of 1 cycle Dose modification: 375 mg/m2 (original dose 375 mg/m2, Cycle 2, Reason: Other (see comments), Comment: insurance)  for chemotherapy treatment.      ALLERGIES:  has No Known Allergies.  MEDICATIONS:  Current Outpatient Medications  Medication Sig Dispense Refill  . acetaminophen (TYLENOL) 500 MG tablet Take 500 mg by mouth every 6 (six) hours as needed.    Marland Kitchen amLODipine (NORVASC) 10 MG tablet Take 1 tablet by mouth daily.    . Calcium Carbonate-Vitamin D (CALTRATE 600+D PO) Take 1 tablet by mouth 2 (two) times daily.    Marland Kitchen docusate calcium (SURFAK) 240 MG capsule Take 1 capsule by mouth 2 (two) times daily.    Marland Kitchen donepezil (ARICEPT) 10 MG tablet Take 1 tablet by mouth at bedtime.    . fluticasone (FLONASE) 50 MCG/ACT nasal spray Place 2 sprays into both nostrils daily.    . hydrochlorothiazide (HYDRODIURIL) 25 MG tablet Take 1 tablet by mouth daily.    . irbesartan (AVAPRO) 300 MG tablet Take 1 tablet by mouth daily.    Marland Kitchen lidocaine-prilocaine (EMLA) cream Apply to affected area once 30 g 3  . MELATONIN PO Take 1 tablet by mouth daily.    . meloxicam (MOBIC) 15 MG tablet Take 1 tablet by mouth as needed.    . Multiple Vitamin (MULTI-VITAMIN) tablet Take 1 tablet by mouth daily.    . Omega-3 Fatty Acids (FISH OIL) 1000 MG CAPS Take 1 tablet by mouth daily.    Marland Kitchen omeprazole (PRILOSEC) 20 MG capsule Take 1 capsule by mouth  daily.    . ondansetron (ZOFRAN) 8 MG tablet Take 1 tablet (8 mg total) by mouth 2 (two) times daily as needed for refractory nausea / vomiting. (Patient not taking: Reported on 09/03/2019) 30 tablet 1  . predniSONE (DELTASONE) 20 MG tablet Take 3 tablets (60 mg total) by mouth daily with breakfast. Take on days 1-5 of chemotherapy. 60 tablet 3  . prochlorperazine (COMPAZINE) 10 MG tablet Take 1 tablet (10 mg total) by mouth every 6 (six) hours as needed (Nausea or vomiting). (Patient not taking: Reported on 09/03/2019) 30 tablet 6  . traZODone (DESYREL) 50 MG tablet Take 1 tablet by mouth 2 (two) times daily at 8 am and 10 pm.     No current facility-administered medications for this visit.   Facility-Administered Medications Ordered in Other Visits  Medication Dose Route Frequency Provider Last Rate Last Admin  . heparin lock flush 100 unit/mL  500 Units Intravenous Once Faythe Casa E, NP      . heparin lock flush 100 unit/mL  500 Units Intravenous Once Charlaine Dalton R, MD      . riTUXimab-pvvr (RUXIENCE) 500 mg in  sodium chloride 0.9 % 200 mL infusion  375 mg/m2 (Treatment Plan Recorded) Intravenous Once Charlaine Dalton R, MD      . sodium chloride flush (NS) 0.9 % injection 10 mL  10 mL Intravenous PRN Jacquelin Hawking, NP        VITAL SIGNS: There were no vitals taken for this visit. There were no vitals filed for this visit.  Estimated body mass index is 19.95 kg/m as calculated from the following:   Height as of 07/05/19: _0  (1.549 m).   Weight as of an earlier encounter on 09/06/19: 105 lb 9.6 oz (47.9 kg).  LABS: CBC:    Component Value Date/Time   WBC 8.7 09/06/2019 0819   HGB 10.8 (L) 09/06/2019 0819   HGB 14.1 10/28/2012 0950   HCT 33.8 (L) 09/06/2019 0819   HCT 41.4 10/28/2012 0950   PLT 344 09/06/2019 0819   PLT 200 10/28/2012 0950   MCV 94.2 09/06/2019 0819   MCV 92 10/28/2012 0950   NEUTROABS 8.1 (H) 09/06/2019 0819   NEUTROABS 3.8 10/28/2012  0950   LYMPHSABS 0.4 (L) 09/06/2019 0819   LYMPHSABS 1.4 10/28/2012 0950   MONOABS 0.1 09/06/2019 0819   MONOABS 0.4 10/28/2012 0950   EOSABS 0.0 09/06/2019 0819   EOSABS 0.2 10/28/2012 0950   BASOSABS 0.0 09/06/2019 0819   BASOSABS 0.0 10/28/2012 0950   Comprehensive Metabolic Panel:    Component Value Date/Time   NA 135 09/06/2019 0819   NA 139 10/28/2012 0950   K 3.7 09/06/2019 0819   K 3.4 (L) 02/18/2013 1508   CL 102 09/06/2019 0819   CL 98 10/28/2012 0950   CO2 24 09/06/2019 0819   CO2 33 (H) 10/28/2012 0950   BUN 25 (H) 09/06/2019 0819   BUN 16 10/28/2012 0950   CREATININE 0.77 09/06/2019 0819   CREATININE 0.74 10/28/2012 0950   GLUCOSE 121 (H) 09/06/2019 0819   GLUCOSE 105 (H) 10/28/2012 0950   CALCIUM 9.3 09/06/2019 0819   CALCIUM 10.0 10/28/2012 0950   AST 19 09/06/2019 0819   AST 26 10/28/2012 0950   ALT 17 09/06/2019 0819   ALT 28 10/28/2012 0950   ALKPHOS 118 09/06/2019 0819   ALKPHOS 142 (H) 10/28/2012 0950   BILITOT 0.7 09/06/2019 0819   BILITOT 0.4 10/28/2012 0950   PROT 6.8 09/06/2019 0819   PROT 8.2 10/28/2012 0950   ALBUMIN 4.0 09/06/2019 0819   ALBUMIN 4.0 10/28/2012 0950    RADIOGRAPHIC STUDIES: No results found.  PERFORMANCE STATUS (ECOG) : 0 - Asymptomatic  Review of Systems Unless otherwise noted, a complete review of systems is negative.  Physical Exam General: NAD, frail appearing, thin Pulmonary: Unlabored Extremities: no edema, no joint deformities Skin: no rashes Neurological: Weakness but otherwise nonfocal  IMPRESSION: Routine follow-up visit today.  Patient was seen in the infusion area.  Patient reports that she is doing reasonably well.  She denies any acute changes or concerns.  She reports good oral intake.  She continues to receive good [facility] from [facility].  Weight is stable to 105 pounds.   PLAN: -Continue current scope of treatment -MOST form previously reviewed -Follow-up telephone visit in 3 to 4  weeks   Patient expressed understanding and was in agreement with this plan. She also understands that She can call the clinic at any time with any questions, concerns, or complaints.     Time Total: 10 minutes  Visit consisted of counseling and education dealing with the complex and  emotionally intense issues of symptom management and palliative care in the setting of serious and potentially life-threatening illness.Greater than 50%  of this time was spent counseling and coordinating care related to the above assessment and plan.  Signed by: Altha Harm, PhD, NP-C [phone] (Work Cell)

## 2019-09-06 NOTE — Assessment & Plan Note (Addendum)
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 S/p 3- R-miniCHOP appx 3 weeks ago.  Partial response.  # proceed with mini-RCHOP today # 4; Labs today reviewed;  acceptable for treatment today. Will get PET scan in 4 weeks.  Discussed that this should be her last cycle of chemotherapy.  Would recommend radiation based upon the results of the PET scan; however patient is likely to get radiation therapy than not.  # Dementia: STABLE; continues to be fairly independent.  # Constipation: sec to chemo- on miralax;- STABLE.   # I spoke at length with the patient's family/daughter, son-regarding the patient's clinical status/plan of care.  Family agreement.   # DISPOSITION: # Proceed with chemo today; d-2 fulphilia # in 2month- MD;labs-cbc/cmp/LDH; PET prior--dr.B.

## 2019-09-07 ENCOUNTER — Inpatient Hospital Stay: Payer: Medicare Other

## 2019-09-07 ENCOUNTER — Other Ambulatory Visit: Payer: Self-pay

## 2019-09-07 ENCOUNTER — Other Ambulatory Visit: Payer: Medicare Other

## 2019-09-07 DIAGNOSIS — Z5111 Encounter for antineoplastic chemotherapy: Secondary | ICD-10-CM | POA: Diagnosis not present

## 2019-09-07 DIAGNOSIS — C8599 Non-Hodgkin lymphoma, unspecified, extranodal and solid organ sites: Secondary | ICD-10-CM

## 2019-09-07 MED ORDER — PEGFILGRASTIM-JMDB 6 MG/0.6ML ~~LOC~~ SOSY
6.0000 mg | PREFILLED_SYRINGE | Freq: Once | SUBCUTANEOUS | Status: AC
  Administered 2019-09-07: 6 mg via SUBCUTANEOUS
  Filled 2019-09-07: qty 0.6

## 2019-09-14 ENCOUNTER — Encounter: Payer: Self-pay | Admitting: Nurse Practitioner

## 2019-09-27 ENCOUNTER — Inpatient Hospital Stay: Payer: Medicare Other | Attending: Hospice and Palliative Medicine | Admitting: Hospice and Palliative Medicine

## 2019-09-27 DIAGNOSIS — Z9221 Personal history of antineoplastic chemotherapy: Secondary | ICD-10-CM | POA: Insufficient documentation

## 2019-09-27 DIAGNOSIS — C8599 Non-Hodgkin lymphoma, unspecified, extranodal and solid organ sites: Secondary | ICD-10-CM | POA: Insufficient documentation

## 2019-09-27 DIAGNOSIS — Z515 Encounter for palliative care: Secondary | ICD-10-CM

## 2019-09-27 DIAGNOSIS — K5900 Constipation, unspecified: Secondary | ICD-10-CM | POA: Insufficient documentation

## 2019-09-27 DIAGNOSIS — Z923 Personal history of irradiation: Secondary | ICD-10-CM | POA: Insufficient documentation

## 2019-09-27 NOTE — Progress Notes (Signed)
Virtual Visit via Telephone Note  I connected with [name] on 09/27/19 at 11:30 AM EST by telephone and verified that I am speaking with the correct person using two identifiers.   I discussed the limitations, risks, security and privacy concerns of performing an evaluation and management service by telephone and the availability of in person appointments. I also discussed with the patient that there may be a patient responsible charge related to this service. The patient expressed understanding and agreed to proceed.   History of Present Illness: [name] is an 86 y.o. 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  Patient had severe symptoms from previous treatment and was initially reluctant to pursue further cancer treatment.  She agreed with mini-RCHOP. Patient was referred to palliative care to help address goals and provide with support.   Observations/Objective: I called spoke with patient by phone.  She reports doing well.  She denies any significant changes or concerns.  No distressing symptoms reported.  Patient did describe occasional constipation.  She is currently managing with daily MiraLAX but we also discussed use of Senokot as needed.  No changes to oral intake.  Performance status is stable.  Assessment and Plan: Lymphoma -mini R-CHOP.  Followed by Dr. Rogue Bussing with next clinic appointment on 1/11 following PET scan on 1/6.  Follow Up Instructions: Follow-up telephone visit in 3 to 4 weeks   I discussed the assessment and treatment plan with the patient. The patient was provided an opportunity to ask questions and all were answered. The patient agreed with the plan and demonstrated an understanding of the instructions.   The patient was advised to call back or seek an in-person evaluation if the symptoms worsen or if the condition fails  to improve as anticipated.  I provided 5 minutes of non-face-to-face time during this encounter.   Irean Hong, NP

## 2019-09-29 ENCOUNTER — Encounter
Admission: RE | Admit: 2019-09-29 | Discharge: 2019-09-29 | Disposition: A | Discharge status: Home / Self Care | Payer: Medicare Other | Source: Ambulatory Visit | Attending: Internal Medicine | Admitting: Internal Medicine

## 2019-09-29 ENCOUNTER — Other Ambulatory Visit: Payer: Self-pay

## 2019-09-29 DIAGNOSIS — Z79899 Other long term (current) drug therapy: Secondary | ICD-10-CM | POA: Insufficient documentation

## 2019-09-29 DIAGNOSIS — C8599 Non-Hodgkin lymphoma, unspecified, extranodal and solid organ sites: Secondary | ICD-10-CM | POA: Diagnosis not present

## 2019-09-29 DIAGNOSIS — Z923 Personal history of irradiation: Secondary | ICD-10-CM | POA: Diagnosis not present

## 2019-09-29 DIAGNOSIS — I10 Essential (primary) hypertension: Secondary | ICD-10-CM | POA: Insufficient documentation

## 2019-09-29 LAB — GLUCOSE, CAPILLARY: Glucose-Capillary: 75 mg/dL (ref 70–99)

## 2019-09-29 MED ORDER — FLUDEOXYGLUCOSE F - 18 (FDG) INJECTION
5.8000 | Freq: Once | INTRAVENOUS | Status: AC | PRN
  Administered 2019-09-29: 5.8 via INTRAVENOUS

## 2019-09-30 ENCOUNTER — Telehealth: Payer: Self-pay | Admitting: Internal Medicine

## 2019-09-30 NOTE — Telephone Encounter (Signed)
Left a message for daughter, [name] that PET scan shows improved response.  Will discuss regarding radiation at next visit.

## 2019-10-01 NOTE — Progress Notes (Signed)
Patient is coming in for follow up she is doing well no complaints  

## 2019-10-04 ENCOUNTER — Other Ambulatory Visit: Payer: Self-pay

## 2019-10-04 ENCOUNTER — Inpatient Hospital Stay: Payer: Medicare Other

## 2019-10-04 ENCOUNTER — Inpatient Hospital Stay (HOSPITAL_BASED_OUTPATIENT_CLINIC_OR_DEPARTMENT_OTHER): Payer: Medicare Other | Admitting: Internal Medicine

## 2019-10-04 DIAGNOSIS — Z923 Personal history of irradiation: Secondary | ICD-10-CM | POA: Diagnosis not present

## 2019-10-04 DIAGNOSIS — C8599 Non-Hodgkin lymphoma, unspecified, extranodal and solid organ sites: Secondary | ICD-10-CM

## 2019-10-04 DIAGNOSIS — Z9221 Personal history of antineoplastic chemotherapy: Secondary | ICD-10-CM | POA: Diagnosis not present

## 2019-10-04 DIAGNOSIS — K5900 Constipation, unspecified: Secondary | ICD-10-CM | POA: Diagnosis not present

## 2019-10-04 LAB — COMPREHENSIVE METABOLIC PANEL
ALT: 19 U/L (ref 0–44)
AST: 23 U/L (ref 15–41)
Albumin: 4.2 g/dL (ref 3.5–5.0)
Alkaline Phosphatase: 118 U/L (ref 38–126)
Anion gap: 10 (ref 5–15)
BUN: 26 mg/dL — ABNORMAL HIGH (ref 8–23)
CO2: 26 mmol/L (ref 22–32)
Calcium: 9.7 mg/dL (ref 8.9–10.3)
Chloride: 101 mmol/L (ref 98–111)
Creatinine, Ser: 0.68 mg/dL (ref 0.44–1.00)
GFR calc Af Amer: >60 mL/min (ref >60)
GFR calc non Af Amer: >60 mL/min (ref >60)
Glucose, Bld: 104 mg/dL — ABNORMAL HIGH (ref 70–99)
Potassium: 4.2 mmol/L (ref 3.5–5.1)
Sodium: 137 mmol/L (ref 135–145)
Total Bilirubin: 0.6 mg/dL (ref 0.3–1.2)
Total Protein: 7 g/dL (ref 6.5–8.1)

## 2019-10-04 LAB — CBC WITH DIFFERENTIAL/PLATELET
Abs Immature Granulocytes: 0.02 10*3/uL (ref 0.00–0.07)
Basophils Absolute: 0.1 10*3/uL (ref 0.0–0.1)
Basophils Relative: 1 %
Eosinophils Absolute: 0.2 10*3/uL (ref 0.0–0.5)
Eosinophils Relative: 3 %
HCT: 35.7 % — ABNORMAL LOW (ref 36.0–46.0)
Hemoglobin: 11.1 g/dL — ABNORMAL LOW (ref 12.0–15.0)
Immature Granulocytes: 0 %
Lymphocytes Relative: 18 %
Lymphs Abs: 1.3 10*3/uL (ref 0.7–4.0)
MCH: 30.3 pg (ref 26.0–34.0)
MCHC: 31.1 g/dL (ref 30.0–36.0)
MCV: 97.5 fL (ref 80.0–100.0)
Monocytes Absolute: 0.6 10*3/uL (ref 0.1–1.0)
Monocytes Relative: 9 %
Neutro Abs: 5 10*3/uL (ref 1.7–7.7)
Neutrophils Relative %: 69 %
Platelets: 310 10*3/uL (ref 150–400)
RBC: 3.66 MIL/uL — ABNORMAL LOW (ref 3.87–5.11)
RDW: 14.8 % (ref 11.5–15.5)
WBC: 7.1 10*3/uL (ref 4.0–10.5)
nRBC: 0 % (ref 0.0–0.2)

## 2019-10-04 LAB — LACTATE DEHYDROGENASE: LDH: 193 U/L — ABNORMAL HIGH (ref 98–192)

## 2019-10-04 NOTE — Progress Notes (Signed)
[facility] CONSULT NOTE  Patient Care Team: Derinda Late, MD as PCP - General (Family Medicine)  CHIEF COMPLAINTS/PURPOSE OF CONSULTATION: Breast lymphoma   Oncology History Overview Note  # AUG 2020- LEFT BREAST DLBCL [non-GCB subtype; Double expressor;NEG- CD-30; Ki-67-80-90%]; FISH -negative for translocations; SEP 2020- PET-left breast intense uptake; no distant disease noted.  Hold off bone marrow biopsy [patient/family preference not to be aggressive]; Stage adjusted IPI-"2 points" cure rate 50-60%.   # 9/21- Pred+Rituxan; OCT 2020- mini-R-CHOP -------------------------------------------------------------------------------------------- # 2006- DLBCL of Breast [s? 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    ;GOALS: Cure  CURRENT/MOST RECENT THERAPY:mini-RCHOP    Lymphoma of breast ([place])  05/25/2019 Initial Diagnosis   Lymphoma of breast ([place])   06/14/2019 -  Chemotherapy   The patient had DOXOrubicin (ADRIAMYCIN) chemo injection 36 mg, 25 mg/m2 = 36 mg (100 % of original dose 25 mg/m2), Intravenous,  Once, 4 of 4 cycles Dose modification: 25 mg/m2 (original dose 25 mg/m2, Cycle 2, Reason: Provider Judgment) Administration: 36 mg (07/05/2019), 36 mg (08/16/2019), 36 mg (07/26/2019), 36 mg (09/06/2019) palonosetron (ALOXI) injection 0.25 mg, 0.25 mg, Intravenous,  Once, 4 of 4 cycles Administration: 0.25 mg (07/05/2019), 0.25 mg (08/16/2019), 0.25 mg (07/26/2019), 0.25 mg (09/06/2019) pegfilgrastim-jmdb (FULPHILA) injection 6 mg, 6 mg, Subcutaneous,  Once, 4 of 4 cycles Administration: 6 mg (07/06/2019), 6 mg (08/17/2019), 6 mg (07/27/2019), 6 mg (09/07/2019) vinCRIStine (ONCOVIN) 2 mg in sodium chloride 0.9 % 50 mL chemo infusion, 2 mg, Intravenous,  Once, 4 of 4 cycles Administration: 2 mg (07/05/2019), 2 mg (08/16/2019), 2 mg  (07/26/2019), 2 mg (09/06/2019) riTUXimab (RITUXAN) 500 mg in sodium chloride 0.9 % 250 mL (1.6667 mg/mL) infusion, 375 mg/m2 = 500 mg, Intravenous,  Once, 1 of 1 cycle Administration: 500 mg (06/14/2019) cyclophosphamide (CYTOXAN) 580 mg in sodium chloride 0.9 % 250 mL chemo infusion, 400 mg/m2 = 580 mg (100 % of original dose 400 mg/m2), Intravenous,  Once, 4 of 4 cycles Dose modification: 600 mg/m2 (original dose 400 mg/m2, Cycle 2, Reason: Provider Judgment), 400 mg/m2 (original dose 400 mg/m2, Cycle 2, Reason: Provider Judgment) Administration: 580 mg (07/05/2019), 580 mg (08/16/2019), 580 mg (07/26/2019), 580 mg (09/06/2019) fosaprepitant (EMEND) 150 mg, dexamethasone (DECADRON) 12 mg in sodium chloride 0.9 % 145 mL IVPB, , Intravenous,  Once, 4 of 4 cycles Administration:  (07/05/2019),  (08/16/2019),  (07/26/2019),  (09/06/2019) riTUXimab-pvvr (RUXIENCE) 500 mg in sodium chloride 0.9 % 250 mL (1.6667 mg/mL) infusion, 375 mg/m2 = 500 mg (100 % of original dose 375 mg/m2), Intravenous,  Once, 1 of 1 cycle Dose modification: 375 mg/m2 (original dose 375 mg/m2, Cycle 2, Reason: Other (see comments), Comment: insurance)  for chemotherapy treatment.       HISTORY OF PRESENTING ILLNESS:  Stephanie Davidson 86 y.o.  female diffuse large B-cell lymphoma of the left breast stage I E/recurrent currently on mini R-CHOP-is here for follow-up/ review results of PET scan..   Patient denies any new lumps or bumps.  Appetite is good.  No weight loss.  No nausea no vomiting.  Review of Systems  Constitutional: Negative for chills, diaphoresis, fever and malaise/fatigue.  HENT: Negative for nosebleeds and sore throat.   Eyes: Negative for double vision.  Respiratory: Negative for cough, hemoptysis, sputum production, shortness of breath and wheezing.   Cardiovascular: Negative for chest pain,  palpitations, orthopnea and leg swelling.  Gastrointestinal: Negative for abdominal pain, blood in stool, constipation,  diarrhea, heartburn, melena, nausea and vomiting.  Genitourinary: Negative for dysuria, frequency and urgency.  Musculoskeletal: Positive for back pain and joint pain.  Skin: Negative.  Negative for itching and rash.  Neurological: Negative for dizziness, tingling, focal weakness, weakness and headaches.  Endo/Heme/Allergies: Does not bruise/bleed easily.  Psychiatric/Behavioral: Positive for memory loss. Negative for depression. The patient is not nervous/anxious and does not have insomnia.      MEDICAL HISTORY:  Past Medical History:  Diagnosis Date  . Breast cancer ([place])   . Hypertension   . Personal history of chemotherapy   . Personal history of radiation therapy     SURGICAL HISTORY: Past Surgical History:  Procedure Laterality Date  . BREAST BIOPSY Right ?   needle bx-benign  . BREAST BIOPSY Left 05/12/2019   [place] bx 12:00, [place] marker, path pending  . BREAST EXCISIONAL BIOPSY Left 2003   lymphoma  . BREAST EXCISIONAL BIOPSY Left ?   lump was removed, benign  . IR IMAGING GUIDED PORT INSERTION  06/02/2019    SOCIAL HISTORY: Social History   Socioeconomic History  . Marital status: Single    Spouse name: Not on file  . Number of children: 2  . Years of education: Not on file  . Highest education level: Not on file  Occupational History  . Not on file  Tobacco Use  . Smoking status: Never Smoker  . Smokeless tobacco: Never Used  Substance and Sexual Activity  . Alcohol use: No  . Drug use: Not on file  . Sexual activity: Not on file  Other Topics Concern  . Not on file  Social History Narrative   [facility]; own apartment; no smoking; no alcohol. 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    Social Determinants of Health   Financial Resource Strain: Low Risk   . Difficulty of Paying Living Expenses: Not hard at all  Food Insecurity: No Food Insecurity  . Worried About Charity fundraiser in the Last Year: Never true  . Ran Out of Food in the Last Year: Never true  Transportation  Needs: No Transportation Needs  . Lack of Transportation (Medical): No  . Lack of Transportation (Non-Medical): No  Physical Activity: Sufficiently Active  . Days of Exercise per Week: 7 days  . Minutes of Exercise per Session: 30 min  Stress: No Stress Concern Present  . Feeling of Stress : Only a little  Social Connections: Unknown  . Frequency of Communication with Friends and Family: More than three times a week  . Frequency of Social Gatherings with Friends and Family: More than three times a week  . Attends Religious Services: Not on file  . Active Member of Clubs or Organizations: No  . Attends Archivist Meetings: Never  . Marital Status: Not on file  Intimate Partner Violence: Not At Risk  . Fear of Current or Ex-Partner: No  . Emotionally Abused: No  . Physically Abused: No  . Sexually Abused: No    FAMILY HISTORY: Family History  Problem Relation Age of Onset  . Breast cancer Neg Hx     ALLERGIES:  has No Known Allergies.  MEDICATIONS:  Current Outpatient Medications  Medication Sig Dispense Refill  . acetaminophen (TYLENOL) 500 MG tablet Take 500 mg by mouth every 6 (six) hours as needed.    Marland Kitchen amLODipine (NORVASC) 10 MG tablet Take 1 tablet by mouth daily.    Marland Kitchen  Calcium Carbonate-Vitamin D (CALTRATE 600+D PO) Take 1 tablet by mouth 2 (two) times daily.    Marland Kitchen docusate calcium (SURFAK) 240 MG capsule Take 1 capsule by mouth 2 (two) times daily.    Marland Kitchen donepezil (ARICEPT) 10 MG tablet Take 1 tablet by mouth at bedtime.    . fluticasone (FLONASE) 50 MCG/ACT nasal spray Place 2 sprays into both nostrils daily.    . hydrochlorothiazide (HYDRODIURIL) 25 MG tablet Take 1 tablet by mouth daily.    . irbesartan (AVAPRO) 300 MG tablet Take 1 tablet by mouth daily.    Marland Kitchen lidocaine-prilocaine (EMLA) cream Apply to affected area once 30 g 3  . MELATONIN PO Take 1 tablet by mouth daily.    . meloxicam (MOBIC) 15 MG tablet Take 1 tablet by mouth as needed.    . Multiple  Vitamin (MULTI-VITAMIN) tablet Take 1 tablet by mouth daily.    . Omega-3 Fatty Acids (FISH OIL) 1000 MG CAPS Take 1 tablet by mouth daily.    Marland Kitchen omeprazole (PRILOSEC) 20 MG capsule Take 1 capsule by mouth daily.    . ondansetron (ZOFRAN) 8 MG tablet Take 1 tablet (8 mg total) by mouth 2 (two) times daily as needed for refractory nausea / vomiting. 30 tablet 1  . predniSONE (DELTASONE) 20 MG tablet Take 3 tablets (60 mg total) by mouth daily with breakfast. Take on days 1-5 of chemotherapy. 60 tablet 3  . prochlorperazine (COMPAZINE) 10 MG tablet Take 1 tablet (10 mg total) by mouth every 6 (six) hours as needed (Nausea or vomiting). 30 tablet 6  . traZODone (DESYREL) 50 MG tablet Take 1 tablet by mouth 2 (two) times daily at 8 am and 10 pm.     No current facility-administered medications for this visit.   Facility-Administered Medications Ordered in Other Visits  Medication Dose Route Frequency Provider Last Rate Last Admin  . heparin lock flush 100 unit/mL  500 Units Intravenous Once Faythe Casa E, NP      . sodium chloride flush (NS) 0.9 % injection 10 mL  10 mL Intravenous PRN Jacquelin Hawking, NP          .  PHYSICAL EXAMINATION: ECOG PERFORMANCE STATUS: 1 - Symptomatic but completely ambulatory  Vitals:   10/04/19 0938  BP: (!) 147/65  Pulse: 78  Temp: (!) 97.1 F (36.2 C)   Filed Weights   10/04/19 0938  Weight: 108 lb (49 kg)    Physical Exam  Constitutional: She is oriented to person, place, and time and well-developed, well-nourished, and in no distress.  Accompanied by son.  She is walking herself.  HENT:  Head: Normocephalic and atraumatic.  Mouth/Throat: Oropharynx is clear and moist. No oropharyngeal exudate.  Eyes: Pupils are equal, round, and reactive to light.  Cardiovascular: Normal rate and regular rhythm.  Pulmonary/Chest: Effort normal and breath sounds normal. No respiratory distress. She has no wheezes.  Abdominal: Soft. Bowel sounds are normal.  She exhibits no distension and no mass. There is no abdominal tenderness. There is no rebound and no guarding.  Musculoskeletal:        General: No tenderness or edema. Normal range of motion.     Cervical back: Normal range of motion and neck supple.  Neurological: She is alert and oriented to person, place, and time.  Skin: Skin is warm.  Left breast upper inner quadrant- ~2.5cm ( improved from basleine-5.5 x 4.5 cm).   Psychiatric: Affect normal.     LABORATORY DATA:  I  have reviewed the data as listed Lab Results  Component Value Date   WBC 7.1 10/04/2019   HGB 11.1 (L) 10/04/2019   HCT 35.7 (L) 10/04/2019   MCV 97.5 10/04/2019   PLT 310 10/04/2019   Recent Labs    08/16/19 0809 08/27/19 1058 09/06/19 0819 10/04/19 0910  NA 132* 133* 135 137  K 4.1 3.8 3.7 4.2  CL 101 99 102 101  CO2 _0 GLUCOSE 110* 114* 121* 104*  BUN 19 18 25* 26*  CREATININE 0.63 0.72 0.77 0.68  CALCIUM 8.9 9.2 9.3 9.7  GFRNONAA >60 >60 >60 >60  GFRAA >60 >60 >60 >60  PROT 6.7  --  6.8 7.0  ALBUMIN 3.9  --  4.0 4.2  AST 18  --  19 23  ALT 15  --  17 19  ALKPHOS 121  --  118 118  BILITOT 0.5  --  0.7 0.6    RADIOGRAPHIC STUDIES: I have personally reviewed the radiological images as listed and agreed with the findings in the report. NM PET Image Restag (PS) Skull Base To Thigh  Result Date: 09/29/2019 CLINICAL DATA:  Subsequent treatment strategy for lymphoma of the left breast. EXAM: NUCLEAR MEDICINE PET SKULL BASE TO THIGH TECHNIQUE: 5.8 mCi F-18 FDG was injected intravenously. Full-ring PET imaging was performed from the skull base to thigh after the radiotracer. CT data was obtained and used for attenuation correction and anatomic localization. Fasting blood glucose: 75 mg/dl COMPARISON:  PET-CT 06/03/2019 and 11/03/2012. FINDINGS: Mediastinal blood pool activity: SUV max 2.0 Liver activity: SUV max 3.0 NECK: No hypermetabolic cervical lymph nodes are identified.There are no lesions  of the pharyngeal mucosal space. Previously demonstrated left tonsillar activity has resolved. Incidental CT findings: Bilateral carotid atherosclerosis. CHEST: There are no hypermetabolic mediastinal, hilar or axillary lymph nodes. There is no hypermetabolic pulmonary activity or suspicious pulmonary nodularity. The previously demonstrated hypermetabolic mass medially in the left breast has resolved. The activity surrounding the surgical clip has an SUV max of 1.8 (previously 13.7). Incidental CT findings: Right IJ Port-A-Cath extends to the superior cavoatrial junction. There is atherosclerosis of the aorta, great vessels and coronary arteries. 2.0 cm right thyroid nodule demonstrates no hypermetabolic activity and is similar in size to remote PET-CT. Stable scarring anteriorly in the left upper lobe attributed to previous radiation therapy. Stable calcified left upper lobe granuloma. ABDOMEN/PELVIS: There is no hypermetabolic activity within the liver, adrenal glands, spleen or pancreas. There is no hypermetabolic nodal activity. Incidental CT findings: Aortic and branch vessel atherosclerosis. Stable low-density lesions within the liver and left kidney consistent with cysts. SKELETON: There is no hypermetabolic activity to suggest osseous metastatic disease. Incidental CT findings: Previous pinning of the right hip. Lower lumbar spondylosis. IMPRESSION: 1. Interval resolution of previously demonstrated hypermetabolic left breast mass consistent with treated lymphoma. [place] 2. 2. No new uptake to suggest recurrent lymphoma. 3. Stable incidental findings including sequela of prior left breast radiation and granulomatous disease. Electronically Signed   By: Richardean Sale M.D.   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 Stage I E. S/p 4- R-miniCHOP; JAN 5th 2021- PET scan-[place] 2; however mass  still approximately 2.5 cm in size noted  #Recommend proceeding with consolidation radiation.  Discussed the radiation schedule-Monday through Friday [5 times a week]; possible radiation side effects including skin rash/fatigue-long-term radiation adverse events including pneumonitis/malignancies  etc.  We will make a referral to Dr. Donella Stade.  # Dementia: STABLE; continues to be fairly independent.  # Constipation: sec to chemo- on miralax;- stable.   # port/IV access- stable; discussed re: pro and cons of keeping the port vs. Explantation.  Undecided at this time/will discuss further  # I spoke at length with the patient's  son-regarding the patient's clinical status/plan of care.  Family agreement.   # I discussed regarding Covid precautions/and also discussed proceeding with Covid vaccination when available.  Discussed that unfortunately the data safety and efficacy of vaccination is unclear especially in patients with immunocompromised state.  However, I think the benefits of the vaccination outweigh the potential risks.  # DISPOSITION: # Referral to Dr.Crystal re: lymphoma/ in 1 week # in 2 month- MD;labs-cbc/cmp/LDH;port flush- Dr.B.  Cc; Dr.Baboff    All questions were answered. The patient knows to call the clinic with any problems, questions or concerns.    Cammie Sickle, MD 10/04/2019 10:28 AM

## 2019-10-04 NOTE — Assessment & Plan Note (Addendum)
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 Stage I E. S/p 4- R-miniCHOP; JAN 5th 2021- PET scan-[place] 2; however mass still approximately 2.5 cm in size noted  #Recommend proceeding with consolidation radiation.  Discussed the radiation schedule-Monday through Friday [5 times a week]; possible radiation side effects including skin rash/fatigue-long-term radiation adverse events including pneumonitis/malignancies etc.  We will make a referral to Dr. Donella Stade.  # Dementia: STABLE; continues to be fairly independent.  # Constipation: sec to chemo- on miralax;- stable.   # port/IV access- stable; discussed re: pro and cons of keeping the port vs. Explantation.  Undecided at this time/will discuss further  # I spoke at length with the patient's  son-regarding the patient's clinical status/plan of care.  Family agreement.   # I discussed regarding Covid precautions/and also discussed proceeding with Covid vaccination when available.  Discussed that unfortunately the data safety and efficacy of vaccination is unclear especially in patients with immunocompromised state.  However, I think the benefits of the vaccination outweigh the potential risks.  # DISPOSITION: # Referral to Dr.Crystal re: lymphoma/ in 1 week # in 2 month- MD;labs-cbc/cmp/LDH;port flush- Dr.B.  Cc; Dr.Baboff

## 2019-10-04 NOTE — Progress Notes (Signed)
Nutrition Follow-up:  Patient with large B-cell lymphoma of the breast.  Patient has completed chemotherapy treatment and referral for radiation noted per Dr. Sharmaine Base note.   Met with patient and son following MD visit.  Patient reports that her appetite continues to be good.  Lives at [facility] and 1 meal a day (usually lunch or dinner) is brought to door each day including meat and vegetables and dessert.  Has been eating good sources of protein (meats, chicken salad, eggs, quiche).  Denies any nutrition impact symptoms at this time.     Medications: reviewed  Labs: reviewed  Anthropometrics:   Weight increased to 108 lb today from 105 lb 9.6 oz on 12/14   NUTRITION DIAGNOSIS:  Increased nutrient needs improved.   INTERVENTION:  Encouraged patient to continue eating well-balanced diet including good sources of protein.  Contact information provided and patient to reach out to RD if changes in appetite, weight or nutrition.       NEXT VISIT: no follow-up  Beya Tipps B. Zenia Resides, [place], [place] Registered Dietitian [phone] (pager)

## 2019-10-08 ENCOUNTER — Encounter: Payer: Self-pay | Admitting: Radiation Oncology

## 2019-10-11 ENCOUNTER — Other Ambulatory Visit: Payer: Self-pay

## 2019-10-12 ENCOUNTER — Other Ambulatory Visit: Payer: Self-pay

## 2019-10-12 ENCOUNTER — Ambulatory Visit
Admission: RE | Admit: 2019-10-12 | Discharge: 2019-10-12 | Disposition: A | Discharge status: Home / Self Care | Payer: Medicare Other | Source: Ambulatory Visit | Attending: Radiation Oncology | Admitting: Radiation Oncology

## 2019-10-12 ENCOUNTER — Encounter: Payer: Self-pay | Admitting: Radiation Oncology

## 2019-10-12 VITALS — BP 143/57 | HR 75 | Temp 97.4°F | Wt 108.7 lb

## 2019-10-12 DIAGNOSIS — C8599 Non-Hodgkin lymphoma, unspecified, extranodal and solid organ sites: Secondary | ICD-10-CM

## 2019-10-12 DIAGNOSIS — C8330 Diffuse large B-cell lymphoma, unspecified site: Secondary | ICD-10-CM | POA: Diagnosis not present

## 2019-10-12 NOTE — Consult Note (Signed)
NEW PATIENT EVALUATION  Name: Stephanie Davidson  MRN: [id number]  Date:   10/12/2019     DOB: [birth year]   This 86 y.o. 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  REFERRING PHYSICIAN: Derinda Late, MD  CHIEF COMPLAINT:  Chief Complaint  Patient presents with  . Cancer    Initial consultation    DIAGNOSIS: The encounter diagnosis was Lymphoma of breast ([place]).   PREVIOUS INVESTIGATIONS:  Previous treatment records from [facility] requested Pathology report reviewed Clinical notes reviewed PET CT scans reviewed  HPI: Patient is an 86 year old female originally presented back in 2006 with diffuse large B-cell lymphoma of the left breast status post 3 cycles of R-CHOP and radiation therapy at [facility].  In August 2020 she developed recurrence in the left breast of DLBCL.  PET CT scan in September showed hypermetabolic activity consistent with known lymphoma.  No distant disease was noted.  She underwent mini R-CHOP with prednisone and Rituxan starting in September 2020.  She by PET/CT criteria had a complete response although there was still residual mass measuring approximate 2.5 cm in the breast.  She specifically [place] breast tenderness cough or bone pain.  She also denies fever chills or night sweats.  She is seen today for consideration of involved field radiation therapy.  PLANNED TREATMENT REGIMEN: Left breast involved field radiation therapy 3000 cGy in 15 fractions  PAST MEDICAL HISTORY:  has a past medical history of Breast cancer ([place]), Hypertension, Personal history of chemotherapy, and Personal history of radiation therapy.    PAST SURGICAL HISTORY:  Past Surgical History:  Procedure Laterality Date  . BREAST BIOPSY Right ?   needle bx-benign  . BREAST BIOPSY Left 05/12/2019   [place] bx 12:00, venus marker, path pending  . BREAST EXCISIONAL BIOPSY Left 2003   lymphoma  . BREAST  EXCISIONAL BIOPSY Left ?   lump was removed, benign  . IR IMAGING GUIDED PORT INSERTION  06/02/2019    FAMILY HISTORY: family history is not on file.  SOCIAL HISTORY:  reports that she has never smoked. She has never used smokeless tobacco. She reports that she does not drink alcohol.  ALLERGIES: Patient has no known allergies.  MEDICATIONS:  Current Outpatient Medications  Medication Sig Dispense Refill  . acetaminophen (TYLENOL) 500 MG tablet Take 500 mg by mouth every 6 (six) hours as needed.    Marland Kitchen amLODipine (NORVASC) 10 MG tablet Take 1 tablet by mouth daily.    . Calcium Carbonate-Vitamin D (CALTRATE 600+D PO) Take 1 tablet by mouth 2 (two) times daily.    Marland Kitchen docusate calcium (SURFAK) 240 MG capsule Take 1 capsule by mouth 2 (two) times daily.    Marland Kitchen donepezil (ARICEPT) 10 MG tablet Take 1 tablet by mouth at bedtime.    . fluticasone (FLONASE) 50 MCG/ACT nasal spray Place 2 sprays into both nostrils daily.    . hydrochlorothiazide (HYDRODIURIL) 25 MG tablet Take 1 tablet by mouth daily.    . irbesartan (AVAPRO) 300 MG tablet Take 1 tablet by mouth daily.    Marland Kitchen lidocaine-prilocaine (EMLA) cream Apply to affected area once 30 g 3  . MELATONIN PO Take 1 tablet by mouth daily.    . meloxicam (MOBIC) 15 MG tablet Take 1 tablet by mouth as needed.    . Multiple Vitamin (MULTI-VITAMIN) tablet Take 1 tablet by mouth daily.    . Omega-3 Fatty Acids (FISH OIL)  1000 MG CAPS Take 1 tablet by mouth daily.    Marland Kitchen omeprazole (PRILOSEC) 20 MG capsule Take 1 capsule by mouth daily.    . ondansetron (ZOFRAN) 8 MG tablet Take 1 tablet (8 mg total) by mouth 2 (two) times daily as needed for refractory nausea / vomiting. 30 tablet 1  . predniSONE (DELTASONE) 20 MG tablet Take 3 tablets (60 mg total) by mouth daily with breakfast. Take on days 1-5 of chemotherapy. 60 tablet 3  . prochlorperazine (COMPAZINE) 10 MG tablet Take 1 tablet (10 mg total) by mouth every 6 (six) hours as needed (Nausea or vomiting). 30  tablet 6  . traZODone (DESYREL) 50 MG tablet Take 1 tablet by mouth 2 (two) times daily at 8 am and 10 pm.     No current facility-administered medications for this encounter.   Facility-Administered Medications Ordered in Other Encounters  Medication Dose Route Frequency Provider Last Rate Last Admin  . heparin lock flush 100 unit/mL  500 Units Intravenous Once Faythe Casa E, NP      . sodium chloride flush (NS) 0.9 % injection 10 mL  10 mL Intravenous PRN Jacquelin Hawking, NP        ECOG PERFORMANCE STATUS:  0 - Asymptomatic  REVIEW OF SYSTEMS: Patient denies any weight loss, fatigue, weakness, fever, chills or night sweats. Patient denies any loss of vision, blurred vision. Patient denies any ringing  of the ears or hearing loss. No irregular heartbeat. Patient denies heart murmur or history of fainting. Patient denies any chest pain or pain radiating to her upper extremities. Patient denies any shortness of breath, difficulty breathing at night, cough or hemoptysis. Patient denies any swelling in the lower legs. Patient denies any nausea vomiting, vomiting of blood, or coffee ground material in the vomitus. Patient denies any stomach pain. Patient states has had normal bowel movements no significant constipation or diarrhea. Patient denies any dysuria, hematuria or significant nocturia. Patient denies any problems walking, swelling in the joints or loss of balance. Patient denies any skin changes, loss of hair or loss of weight. Patient denies any excessive worrying or anxiety or significant depression. Patient denies any problems with insomnia. Patient denies excessive thirst, polyuria, polydipsia. Patient denies any swollen glands, patient denies easy bruising or easy bleeding. Patient denies any recent infections, allergies or URI. Patient "s visual fields have not changed significantly in recent time.   PHYSICAL EXAM: BP (!) 143/57 (BP Location: Left Arm, Patient Position: Sitting)    Pulse 75   Temp (!) 97.4 F (36.3 C) (Tympanic)   Wt 108 lb 11.2 oz (49.3 kg)   BMI 20.54 kg/m  There is some subtle density in the left breast again measuring about approximately 2 cm consistent with possible residual lymphoma although area is not hypermetabolic on PET CT scan.  Well-developed well-nourished patient in NAD. HEENT reveals PERLA, EOMI, discs not visualized.  Oral cavity is clear. No oral mucosal lesions are identified. Neck is clear without evidence of cervical or supraclavicular adenopathy. Lungs are clear to A&P. Cardiac examination is essentially unremarkable with regular rate and rhythm without murmur rub or thrill. Abdomen is benign with no organomegaly or masses noted. Motor sensory and DTR levels are equal and symmetric in the upper and lower extremities. Cranial nerves II through XII are grossly intact. Proprioception is intact. No peripheral adenopathy or edema is identified. No motor or sensory levels are noted. Crude visual fields are within normal range.  LABORATORY DATA: Pathology report reviewed  RADIOLOGY RESULTS: Serial PET CT scans reviewed   IMPRESSION: Recurrent diffuse large B-cell lymphoma of the left breast in an 86 year old female with good response to mini R-CHOP  PLAN: At this time I believe I can safely deliver another 3000 cGy in 15 fractions to her left breast.  Risks and benefits of treatment including possible inclusion of superficial lung fatigue skin reaction alteration of blood counts and possible more significant skin reaction secondary to previous radiation therapy.  We will obtain her records from [facility] for our files.  Patient and daughter both seem to comprehend my treatment plan well.  I have personally set up and ordered CT simulation for later this week.  I would like to take this opportunity to thank you for allowing me to participate in the care of your patient.Noreene Filbert, MD

## 2019-10-14 ENCOUNTER — Other Ambulatory Visit: Payer: Self-pay

## 2019-10-14 ENCOUNTER — Ambulatory Visit
Admission: RE | Admit: 2019-10-14 | Discharge: 2019-10-14 | Disposition: A | Discharge status: Home / Self Care | Payer: Medicare Other | Source: Ambulatory Visit | Attending: Radiation Oncology | Admitting: Radiation Oncology

## 2019-10-14 DIAGNOSIS — C8330 Diffuse large B-cell lymphoma, unspecified site: Secondary | ICD-10-CM | POA: Diagnosis not present

## 2019-10-15 ENCOUNTER — Other Ambulatory Visit: Payer: Self-pay

## 2019-10-15 ENCOUNTER — Other Ambulatory Visit: Payer: Self-pay | Admitting: *Deleted

## 2019-10-15 DIAGNOSIS — C8599 Non-Hodgkin lymphoma, unspecified, extranodal and solid organ sites: Secondary | ICD-10-CM

## 2019-10-18 ENCOUNTER — Inpatient Hospital Stay (HOSPITAL_BASED_OUTPATIENT_CLINIC_OR_DEPARTMENT_OTHER): Payer: Medicare Other | Admitting: Hospice and Palliative Medicine

## 2019-10-18 DIAGNOSIS — Z515 Encounter for palliative care: Secondary | ICD-10-CM

## 2019-10-18 DIAGNOSIS — C8330 Diffuse large B-cell lymphoma, unspecified site: Secondary | ICD-10-CM | POA: Diagnosis not present

## 2019-10-18 DIAGNOSIS — C8599 Non-Hodgkin lymphoma, unspecified, extranodal and solid organ sites: Secondary | ICD-10-CM | POA: Diagnosis not present

## 2019-10-18 NOTE — Progress Notes (Signed)
Virtual Visit via Telephone Note  I connected with Stephanie Davidson on 10/18/19 at  2:00 PM EST by telephone and verified that I am speaking with the correct person using two identifiers.   I discussed the limitations, risks, security and privacy concerns of performing an evaluation and management service by telephone and the availability of in person appointments. I also discussed with the patient that there may be a patient responsible charge related to this service. The patient expressed understanding and agreed to proceed.   History of Present Illness: [name] is an 86 y.o. 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  Patient had severe symptoms from previous treatment and was initially reluctant to pursue further cancer treatment.  She agreed with mini-RCHOP. Patient was referred to palliative care to help address goals and provide with support.   Observations/Objective: I called spoke with patient by phone.    Patient reports that she is doing well.  She denies any significant changes or concerns.  No distressing symptoms reported.  No changes in appetite or performance status.  No pain.  No issues with medications.  Patient is pending initiation of RT. Assessment and Plan: Lymphoma -mini R-CHOP.  Pending RT.  Follow-up clinic visit on 3/11 with Dr. Rogue Bussing.  Overall, appears to be doing reasonably well.  Follow Up Instructions: Follow-up telephone visit in 2 to 3 months   I discussed the assessment and treatment plan with the patient. The patient was provided an opportunity to ask questions and all were answered. The patient agreed with the plan and demonstrated an understanding of the instructions.   The patient was advised to call back or seek an in-person evaluation if the symptoms worsen or if the condition fails to improve as anticipated.  I  provided 5 minutes of non-face-to-face time during this encounter.   Stephanie Hong, NP

## 2019-10-20 ENCOUNTER — Non-Acute Institutional Stay: Payer: Medicare Other | Admitting: Adult Health Nurse Practitioner

## 2019-10-20 ENCOUNTER — Other Ambulatory Visit: Payer: Self-pay

## 2019-10-20 DIAGNOSIS — C8599 Non-Hodgkin lymphoma, unspecified, extranodal and solid organ sites: Secondary | ICD-10-CM

## 2019-10-20 DIAGNOSIS — Z515 Encounter for palliative care: Secondary | ICD-10-CM

## 2019-10-20 NOTE — Progress Notes (Signed)
[facility] Consult Note Telephone: [phone]  Fax: [phone]  PATIENT NAME: Stephanie Davidson DOB: 12-13-1930 MRN: [id number]  PRIMARY CARE PROVIDER:   Derinda Late, MD  REFERRING PROVIDER:  Altha Harm NP  RESPONSIBLE PARTY:   Becky Sax, daughter [phone]     RECOMMENDATIONS and PLAN:  1.  Advanced care planning.  Patient is full code.  Does wish to have CPR but not prolonged CPR.  Wishes to have full hospital interventions but does not want to stay of ventilation or life support for a prolonged period. Wants antibiotics and IV fluids as indicated and a feeding tube for a trial period.  2.  Lymphoma of breast.  Patient has finished chemo.  Follow up scan showed a decrease in the size of the left breast tumor.  She starts radiation therapy on Monday, 10/25/2019, for 3 weeks with a dry run tomorrow.  She continues to do well with no changes in her appetite and weight is stable.  She denies pain, fever, SOB, cough, N/V/D, constipation, headaches, dizziness.  Her hair is starting to grow back.  She still walks one mile every day. Her medication management is much improved with the pharmacy putting her meds in daily pill box and with her reminders.  Patient is doing well.  Have next appointment in 4 weeks after her radiation therapy.  Family encouraged to call with concerns.  Palliative care will continue to monitor for symptom management/decline and make recommendations as needed.  I spent 30 minutes providing this consultation, including time with patient/family, chart review, provider coordination, and documentation. More than 50% of the time in this consultation was spent coordinating communication.   HISTORY OF PRESENT ILLNESS:  Stephanie Davidson is a 86 y.o. year old female with multiple medical problems including lymphoma of breast, early alzheimer's dementia, HTN. Palliative Care was asked to help address goals of care.   CODE  STATUS: full code  PPS: 60% HOSPICE ELIGIBILITY/DIAGNOSIS: TBD  PHYSICAL EXAM:  O2 98% on RA  HR 76 General: NAD, frail appearing, thin Cardiovascular: regular rate and rhythm Pulmonary: lung sounds clear; normal respiratory effort Extremities: no edema, no joint deformities Skin: no rashes on exposed skin Neurological: Weakness but otherwise nonfocal; does have some forgetfulness related to early Alzheimer's dementia   PAST MEDICAL HISTORY:  Past Medical History:  Diagnosis Date  . Breast cancer ([place])   . Hypertension   . Personal history of chemotherapy   . Personal history of radiation therapy     SOCIAL HX:  Social History   Tobacco Use  . Smoking status: Never Smoker  . Smokeless tobacco: Never Used  Substance Use Topics  . Alcohol use: No    ALLERGIES: No Known Allergies   PERTINENT MEDICATIONS:  Outpatient Encounter Medications as of 10/20/2019  Medication Sig  . acetaminophen (TYLENOL) 500 MG tablet Take 500 mg by mouth every 6 (six) hours as needed.  Marland Kitchen amLODipine (NORVASC) 10 MG tablet Take 1 tablet by mouth daily.  . Calcium Carbonate-Vitamin D (CALTRATE 600+D PO) Take 1 tablet by mouth 2 (two) times daily.  Marland Kitchen docusate calcium (SURFAK) 240 MG capsule Take 1 capsule by mouth 2 (two) times daily.  Marland Kitchen donepezil (ARICEPT) 10 MG tablet Take 1 tablet by mouth at bedtime.  . fluticasone (FLONASE) 50 MCG/ACT nasal spray Place 2 sprays into both nostrils daily.  . hydrochlorothiazide (HYDRODIURIL) 25 MG tablet Take 1 tablet by mouth daily.  . irbesartan (AVAPRO) 300 MG  tablet Take 1 tablet by mouth daily.  Marland Kitchen lidocaine-prilocaine (EMLA) cream Apply to affected area once  . MELATONIN PO Take 1 tablet by mouth daily.  . meloxicam (MOBIC) 15 MG tablet Take 1 tablet by mouth as needed.  . Multiple Vitamin (MULTI-VITAMIN) tablet Take 1 tablet by mouth daily.  . Omega-3 Fatty Acids (FISH OIL) 1000 MG CAPS Take 1 tablet by mouth daily.  Marland Kitchen omeprazole (PRILOSEC) 20 MG capsule  Take 1 capsule by mouth daily.  . ondansetron (ZOFRAN) 8 MG tablet Take 1 tablet (8 mg total) by mouth 2 (two) times daily as needed for refractory nausea / vomiting.  . predniSONE (DELTASONE) 20 MG tablet Take 3 tablets (60 mg total) by mouth daily with breakfast. Take on days 1-5 of chemotherapy.  . prochlorperazine (COMPAZINE) 10 MG tablet Take 1 tablet (10 mg total) by mouth every 6 (six) hours as needed (Nausea or vomiting).  . traZODone (DESYREL) 50 MG tablet Take 1 tablet by mouth 2 (two) times daily at 8 am and 10 pm.   Facility-Administered Encounter Medications as of 10/20/2019  Medication  . heparin lock flush 100 unit/mL  . sodium chloride flush (NS) 0.9 % injection 10 mL      Amy Jenetta Downer, NP

## 2019-10-21 ENCOUNTER — Ambulatory Visit
Admission: RE | Admit: 2019-10-21 | Discharge: 2019-10-21 | Disposition: A | Discharge status: Home / Self Care | Payer: Medicare Other | Source: Ambulatory Visit | Attending: Radiation Oncology | Admitting: Radiation Oncology

## 2019-10-21 ENCOUNTER — Other Ambulatory Visit: Payer: Self-pay

## 2019-10-21 DIAGNOSIS — C8330 Diffuse large B-cell lymphoma, unspecified site: Secondary | ICD-10-CM | POA: Diagnosis not present

## 2019-10-25 ENCOUNTER — Other Ambulatory Visit: Payer: Self-pay

## 2019-10-25 ENCOUNTER — Ambulatory Visit
Admission: RE | Admit: 2019-10-25 | Discharge: 2019-10-25 | Disposition: A | Discharge status: Home / Self Care | Payer: Medicare Other | Source: Ambulatory Visit | Attending: Radiation Oncology | Admitting: Radiation Oncology

## 2019-10-25 DIAGNOSIS — C8330 Diffuse large B-cell lymphoma, unspecified site: Secondary | ICD-10-CM | POA: Insufficient documentation

## 2019-10-26 ENCOUNTER — Other Ambulatory Visit: Payer: Self-pay

## 2019-10-26 ENCOUNTER — Ambulatory Visit
Admission: RE | Admit: 2019-10-26 | Discharge: 2019-10-26 | Disposition: A | Discharge status: Home / Self Care | Payer: Medicare Other | Source: Ambulatory Visit | Attending: Radiation Oncology | Admitting: Radiation Oncology

## 2019-10-26 DIAGNOSIS — C8330 Diffuse large B-cell lymphoma, unspecified site: Secondary | ICD-10-CM | POA: Diagnosis not present

## 2019-10-27 ENCOUNTER — Ambulatory Visit
Admission: RE | Admit: 2019-10-27 | Discharge: 2019-10-27 | Disposition: A | Discharge status: Home / Self Care | Payer: Medicare Other | Source: Ambulatory Visit | Attending: Radiation Oncology | Admitting: Radiation Oncology

## 2019-10-27 ENCOUNTER — Other Ambulatory Visit: Payer: Self-pay

## 2019-10-27 DIAGNOSIS — C8330 Diffuse large B-cell lymphoma, unspecified site: Secondary | ICD-10-CM | POA: Diagnosis not present

## 2019-10-28 ENCOUNTER — Ambulatory Visit
Admission: RE | Admit: 2019-10-28 | Discharge: 2019-10-28 | Disposition: A | Discharge status: Home / Self Care | Payer: Medicare Other | Source: Ambulatory Visit | Attending: Radiation Oncology | Admitting: Radiation Oncology

## 2019-10-28 ENCOUNTER — Other Ambulatory Visit: Payer: Self-pay

## 2019-10-28 DIAGNOSIS — C8330 Diffuse large B-cell lymphoma, unspecified site: Secondary | ICD-10-CM | POA: Diagnosis not present

## 2019-10-29 ENCOUNTER — Ambulatory Visit
Admission: RE | Admit: 2019-10-29 | Discharge: 2019-10-29 | Disposition: A | Discharge status: Home / Self Care | Payer: Medicare Other | Source: Ambulatory Visit | Attending: Radiation Oncology | Admitting: Radiation Oncology

## 2019-10-29 ENCOUNTER — Other Ambulatory Visit: Payer: Self-pay

## 2019-10-29 DIAGNOSIS — C8330 Diffuse large B-cell lymphoma, unspecified site: Secondary | ICD-10-CM | POA: Diagnosis not present

## 2019-11-01 ENCOUNTER — Ambulatory Visit
Admission: RE | Admit: 2019-11-01 | Discharge: 2019-11-01 | Disposition: A | Discharge status: Home / Self Care | Payer: Medicare Other | Source: Ambulatory Visit | Attending: Radiation Oncology | Admitting: Radiation Oncology

## 2019-11-01 ENCOUNTER — Other Ambulatory Visit: Payer: Self-pay

## 2019-11-01 DIAGNOSIS — C8330 Diffuse large B-cell lymphoma, unspecified site: Secondary | ICD-10-CM | POA: Diagnosis not present

## 2019-11-02 ENCOUNTER — Ambulatory Visit
Admission: RE | Admit: 2019-11-02 | Discharge: 2019-11-02 | Disposition: A | Discharge status: Home / Self Care | Payer: Medicare Other | Source: Ambulatory Visit | Attending: Radiation Oncology | Admitting: Radiation Oncology

## 2019-11-02 ENCOUNTER — Other Ambulatory Visit: Payer: Self-pay

## 2019-11-02 DIAGNOSIS — C8330 Diffuse large B-cell lymphoma, unspecified site: Secondary | ICD-10-CM | POA: Diagnosis not present

## 2019-11-03 ENCOUNTER — Other Ambulatory Visit: Payer: Self-pay

## 2019-11-03 ENCOUNTER — Inpatient Hospital Stay: Payer: Medicare Other | Attending: Radiation Oncology

## 2019-11-03 ENCOUNTER — Ambulatory Visit
Admission: RE | Admit: 2019-11-03 | Discharge: 2019-11-03 | Disposition: A | Discharge status: Home / Self Care | Payer: Medicare Other | Source: Ambulatory Visit | Attending: Radiation Oncology | Admitting: Radiation Oncology

## 2019-11-03 DIAGNOSIS — C8599 Non-Hodgkin lymphoma, unspecified, extranodal and solid organ sites: Secondary | ICD-10-CM

## 2019-11-03 DIAGNOSIS — C8339 Diffuse large B-cell lymphoma, extranodal and solid organ sites: Secondary | ICD-10-CM | POA: Diagnosis not present

## 2019-11-03 DIAGNOSIS — C8330 Diffuse large B-cell lymphoma, unspecified site: Secondary | ICD-10-CM | POA: Diagnosis not present

## 2019-11-03 LAB — CBC
HCT: 39.2 % (ref 36.0–46.0)
Hemoglobin: 12.4 g/dL (ref 12.0–15.0)
MCH: 30.3 pg (ref 26.0–34.0)
MCHC: 31.6 g/dL (ref 30.0–36.0)
MCV: 95.8 fL (ref 80.0–100.0)
Platelets: 222 10*3/uL (ref 150–400)
RBC: 4.09 MIL/uL (ref 3.87–5.11)
RDW: 12.4 % (ref 11.5–15.5)
WBC: 6.6 10*3/uL (ref 4.0–10.5)
nRBC: 0 % (ref 0.0–0.2)

## 2019-11-04 ENCOUNTER — Ambulatory Visit
Admission: RE | Admit: 2019-11-04 | Discharge: 2019-11-04 | Disposition: A | Discharge status: Home / Self Care | Payer: Medicare Other | Source: Ambulatory Visit | Attending: Radiation Oncology | Admitting: Radiation Oncology

## 2019-11-04 ENCOUNTER — Other Ambulatory Visit: Payer: Self-pay

## 2019-11-04 DIAGNOSIS — C8330 Diffuse large B-cell lymphoma, unspecified site: Secondary | ICD-10-CM | POA: Diagnosis not present

## 2019-11-05 ENCOUNTER — Ambulatory Visit
Admission: RE | Admit: 2019-11-05 | Discharge: 2019-11-05 | Disposition: A | Discharge status: Home / Self Care | Payer: Medicare Other | Source: Ambulatory Visit | Attending: Radiation Oncology | Admitting: Radiation Oncology

## 2019-11-05 ENCOUNTER — Other Ambulatory Visit: Payer: Self-pay

## 2019-11-05 DIAGNOSIS — C8330 Diffuse large B-cell lymphoma, unspecified site: Secondary | ICD-10-CM | POA: Diagnosis not present

## 2019-11-08 ENCOUNTER — Ambulatory Visit
Admission: RE | Admit: 2019-11-08 | Discharge: 2019-11-08 | Disposition: A | Discharge status: Home / Self Care | Payer: Medicare Other | Source: Ambulatory Visit | Attending: Radiation Oncology | Admitting: Radiation Oncology

## 2019-11-08 ENCOUNTER — Other Ambulatory Visit: Payer: Self-pay

## 2019-11-08 DIAGNOSIS — C8330 Diffuse large B-cell lymphoma, unspecified site: Secondary | ICD-10-CM | POA: Diagnosis not present

## 2019-11-09 ENCOUNTER — Ambulatory Visit
Admission: RE | Admit: 2019-11-09 | Discharge: 2019-11-09 | Disposition: A | Discharge status: Home / Self Care | Payer: Medicare Other | Source: Ambulatory Visit | Attending: Radiation Oncology | Admitting: Radiation Oncology

## 2019-11-09 ENCOUNTER — Other Ambulatory Visit: Payer: Self-pay

## 2019-11-09 DIAGNOSIS — C8330 Diffuse large B-cell lymphoma, unspecified site: Secondary | ICD-10-CM | POA: Diagnosis not present

## 2019-11-10 ENCOUNTER — Other Ambulatory Visit: Payer: Self-pay

## 2019-11-10 ENCOUNTER — Ambulatory Visit
Admission: RE | Admit: 2019-11-10 | Discharge: 2019-11-10 | Disposition: A | Discharge status: Home / Self Care | Payer: Medicare Other | Source: Ambulatory Visit | Attending: Radiation Oncology | Admitting: Radiation Oncology

## 2019-11-10 DIAGNOSIS — C8330 Diffuse large B-cell lymphoma, unspecified site: Secondary | ICD-10-CM | POA: Diagnosis not present

## 2019-11-11 ENCOUNTER — Ambulatory Visit
Admission: RE | Admit: 2019-11-11 | Discharge: 2019-11-11 | Disposition: A | Discharge status: Home / Self Care | Payer: Medicare Other | Source: Ambulatory Visit | Attending: Radiation Oncology | Admitting: Radiation Oncology

## 2019-11-11 ENCOUNTER — Other Ambulatory Visit: Payer: Self-pay

## 2019-11-11 DIAGNOSIS — C8330 Diffuse large B-cell lymphoma, unspecified site: Secondary | ICD-10-CM | POA: Diagnosis not present

## 2019-11-12 ENCOUNTER — Other Ambulatory Visit: Payer: Self-pay

## 2019-11-12 ENCOUNTER — Ambulatory Visit
Admission: RE | Admit: 2019-11-12 | Discharge: 2019-11-12 | Disposition: A | Discharge status: Home / Self Care | Payer: Medicare Other | Source: Ambulatory Visit | Attending: Radiation Oncology | Admitting: Radiation Oncology

## 2019-11-12 DIAGNOSIS — C8330 Diffuse large B-cell lymphoma, unspecified site: Secondary | ICD-10-CM | POA: Diagnosis not present

## 2019-11-16 ENCOUNTER — Telehealth: Payer: Self-pay | Admitting: *Deleted

## 2019-11-16 ENCOUNTER — Encounter: Payer: Self-pay | Admitting: Internal Medicine

## 2019-11-16 NOTE — Telephone Encounter (Signed)
Pt/pt's family would like pt to cnl any future port placement and have port removed. Dr. B Please let me know if you are agreeable with the plan above.

## 2019-11-17 ENCOUNTER — Other Ambulatory Visit: Payer: Self-pay

## 2019-11-17 ENCOUNTER — Other Ambulatory Visit: Payer: Medicare Other | Admitting: Adult Health Nurse Practitioner

## 2019-11-17 ENCOUNTER — Other Ambulatory Visit: Payer: Self-pay | Admitting: *Deleted

## 2019-11-17 DIAGNOSIS — Z452 Encounter for adjustment and management of vascular access device: Secondary | ICD-10-CM

## 2019-11-17 DIAGNOSIS — C8599 Non-Hodgkin lymphoma, unspecified, extranodal and solid organ sites: Secondary | ICD-10-CM

## 2019-11-17 DIAGNOSIS — Z515 Encounter for palliative care: Secondary | ICD-10-CM

## 2019-11-17 NOTE — Progress Notes (Signed)
[place] [facility] Consult Note Telephone: [phone]  Fax: [phone]  PATIENT NAME: Stephanie Davidson DOB: Apr 28, 1931 MRN: [id number]  PRIMARY CARE PROVIDER:   Derinda Late, MD  REFERRING PROVIDER:  Altha Harm NP  RESPONSIBLE PARTY:   Becky Sax, daughter [phone]     RECOMMENDATIONS and PLAN:  1.  Advanced care planning. Patient is full code. Does wish to have CPR but not prolonged CPR. Wishes to have full hospital interventions but does not want to stay of ventilation or life support for a prolonged period. Wants antibiotics and IV fluids as indicated and a feeding tube for a trial period.  2.  Lymphoma of breast.  Patient has finished radiation with no lingering side effects.  States feeling good. Denies weakness, pain, burns to skin, N/V/D, fever, SOB, cough, headaches dizziness.  She has no changes in her mobility and still walks everyday as weather permits.  No changes in her appetite and she has not lost weight.  Her hair is starting to grow back from the chemo.  Next appointment with oncology is on 12/02/2019.  Continue follow up and recommendations by oncology.  3.  Dementia.  Patient is having more forgetfulness and daughter is transferring as much bills and tax paperwork to her.  She does have someone come into the home every couple of weeks to help clean.  [facility] in pill [facility] and daughter has noticed that it looks like she may not be taking them as she should.  Have reached out to [place], to see if she could get assistance with medication reminders.  Patient has requested getting rid of her car.  This is a good idea with her increasing forgetfulness and this will be one less thing the family will have to worry about taxes and registration getting paid.   Palliative care will continue to monitor for symptom management/decline and make recommendations as needed.   Have appointment in 6 weeks.  Encouraged to call sooner with questions or concerns.  I spent 50 minutes providing this consultation,  from 9:00 to 9:50 including time with patient/family, chart review, provider coordination, and documentation. More than 50% of the time in this consultation was spent coordinating communication.   HISTORY OF PRESENT ILLNESS:  LEZLIE Davidson is a 86 y.o. year old female with multiple medical problems including including lymphoma of breast, early alzheimer's dementia, HTN. Palliative Care was asked to help address goals of care.   CODE STATUS: full code  PPS: 60% HOSPICE ELIGIBILITY/DIAGNOSIS: TBD  PHYSICAL EXAM:   General: NAD, frail appearing, thin Cardiovascular: regular rate and rhythm Pulmonary: lung sounds clear; normal respiratory effort Extremities: no edema, no joint deformities Skin: no rashes on exposed skin Neurological: Weakness but otherwise nonfocal; does have some forgetfulness related to early Alzheimer's dementia  PAST MEDICAL HISTORY:  Past Medical History:  Diagnosis Date  . Breast cancer ([place])   . Hypertension   . Personal history of chemotherapy   . Personal history of radiation therapy     SOCIAL HX:  Social History   Tobacco Use  . Smoking status: Never Smoker  . Smokeless tobacco: Never Used  Substance Use Topics  . Alcohol use: No    ALLERGIES: No Known Allergies   PERTINENT MEDICATIONS:  Outpatient Encounter Medications as of 11/17/2019  Medication Sig  . acetaminophen (TYLENOL) 500 MG tablet Take 500 mg by mouth every 6 (six) hours as needed.  Marland Kitchen  amLODipine (NORVASC) 10 MG tablet Take 1 tablet by mouth daily.  . Calcium Carbonate-Vitamin D (CALTRATE 600+D PO) Take 1 tablet by mouth 2 (two) times daily.  Marland Kitchen docusate calcium (SURFAK) 240 MG capsule Take 1 capsule by mouth 2 (two) times daily.  Marland Kitchen donepezil (ARICEPT) 10 MG tablet Take 1 tablet by mouth at bedtime.  . fluticasone (FLONASE) 50 MCG/ACT nasal spray Place 2  sprays into both nostrils daily.  . hydrochlorothiazide (HYDRODIURIL) 25 MG tablet Take 1 tablet by mouth daily.  . irbesartan (AVAPRO) 300 MG tablet Take 1 tablet by mouth daily.  Marland Kitchen lidocaine-prilocaine (EMLA) cream Apply to affected area once  . MELATONIN PO Take 1 tablet by mouth daily.  . meloxicam (MOBIC) 15 MG tablet Take 1 tablet by mouth as needed.  . Multiple Vitamin (MULTI-VITAMIN) tablet Take 1 tablet by mouth daily.  . Omega-3 Fatty Acids (FISH OIL) 1000 MG CAPS Take 1 tablet by mouth daily.  Marland Kitchen omeprazole (PRILOSEC) 20 MG capsule Take 1 capsule by mouth daily.  . ondansetron (ZOFRAN) 8 MG tablet Take 1 tablet (8 mg total) by mouth 2 (two) times daily as needed for refractory nausea / vomiting.  . predniSONE (DELTASONE) 20 MG tablet Take 3 tablets (60 mg total) by mouth daily with breakfast. Take on days 1-5 of chemotherapy.  . prochlorperazine (COMPAZINE) 10 MG tablet Take 1 tablet (10 mg total) by mouth every 6 (six) hours as needed (Nausea or vomiting).  . traZODone (DESYREL) 50 MG tablet Take 1 tablet by mouth 2 (two) times daily at 8 am and 10 pm.   Facility-Administered Encounter Medications as of 11/17/2019  Medication  . heparin lock flush 100 unit/mL  . sodium chloride flush (NS) 0.9 % injection 10 mL     Graig Hessling Jenetta Downer, NP

## 2019-11-17 NOTE — Telephone Encounter (Signed)
Per Dr. Rogue Bussing ok to have port removed.  Order placed. And faxed check list to speciality scheduling

## 2019-12-01 ENCOUNTER — Other Ambulatory Visit: Payer: Self-pay

## 2019-12-01 ENCOUNTER — Encounter: Payer: Self-pay | Admitting: Radiation Oncology

## 2019-12-02 ENCOUNTER — Inpatient Hospital Stay: Payer: Medicare Other | Attending: Internal Medicine

## 2019-12-02 ENCOUNTER — Inpatient Hospital Stay (HOSPITAL_BASED_OUTPATIENT_CLINIC_OR_DEPARTMENT_OTHER): Payer: Medicare Other | Admitting: Internal Medicine

## 2019-12-02 ENCOUNTER — Ambulatory Visit
Admission: RE | Admit: 2019-12-02 | Discharge: 2019-12-02 | Disposition: A | Discharge status: Home / Self Care | Payer: Medicare Other | Source: Ambulatory Visit | Attending: Radiation Oncology | Admitting: Radiation Oncology

## 2019-12-02 DIAGNOSIS — Z923 Personal history of irradiation: Secondary | ICD-10-CM | POA: Insufficient documentation

## 2019-12-02 DIAGNOSIS — C8599 Non-Hodgkin lymphoma, unspecified, extranodal and solid organ sites: Secondary | ICD-10-CM

## 2019-12-02 LAB — CBC WITH DIFFERENTIAL/PLATELET
Abs Immature Granulocytes: 0.02 10*3/uL (ref 0.00–0.07)
Basophils Absolute: 0 10*3/uL (ref 0.0–0.1)
Basophils Relative: 1 %
Eosinophils Absolute: 0.3 10*3/uL (ref 0.0–0.5)
Eosinophils Relative: 5 %
HCT: 39 % (ref 36.0–46.0)
Hemoglobin: 12.6 g/dL (ref 12.0–15.0)
Immature Granulocytes: 0 %
Lymphocytes Relative: 24 %
Lymphs Abs: 1.4 10*3/uL (ref 0.7–4.0)
MCH: 29.6 pg (ref 26.0–34.0)
MCHC: 32.3 g/dL (ref 30.0–36.0)
MCV: 91.8 fL (ref 80.0–100.0)
Monocytes Absolute: 0.6 10*3/uL (ref 0.1–1.0)
Monocytes Relative: 10 %
Neutro Abs: 3.5 10*3/uL (ref 1.7–7.7)
Neutrophils Relative %: 60 %
Platelets: 213 10*3/uL (ref 150–400)
RBC: 4.25 MIL/uL (ref 3.87–5.11)
RDW: 12.4 % (ref 11.5–15.5)
WBC: 5.8 10*3/uL (ref 4.0–10.5)
nRBC: 0 % (ref 0.0–0.2)

## 2019-12-02 LAB — COMPREHENSIVE METABOLIC PANEL
ALT: 17 U/L (ref 0–44)
AST: 23 U/L (ref 15–41)
Albumin: 4.2 g/dL (ref 3.5–5.0)
Alkaline Phosphatase: 114 U/L (ref 38–126)
Anion gap: 9 (ref 5–15)
BUN: 22 mg/dL (ref 8–23)
CO2: 26 mmol/L (ref 22–32)
Calcium: 9.8 mg/dL (ref 8.9–10.3)
Chloride: 100 mmol/L (ref 98–111)
Creatinine, Ser: 0.57 mg/dL (ref 0.44–1.00)
GFR calc Af Amer: >60 mL/min (ref >60)
GFR calc non Af Amer: >60 mL/min (ref >60)
Glucose, Bld: 100 mg/dL — ABNORMAL HIGH (ref 70–99)
Potassium: 4.2 mmol/L (ref 3.5–5.1)
Sodium: 135 mmol/L (ref 135–145)
Total Bilirubin: 0.6 mg/dL (ref 0.3–1.2)
Total Protein: 6.7 g/dL (ref 6.5–8.1)

## 2019-12-02 LAB — LACTATE DEHYDROGENASE: LDH: 182 U/L (ref 98–192)

## 2019-12-02 MED ORDER — HEPARIN SOD (PORK) LOCK FLUSH 100 UNIT/ML IV SOLN
500.0000 [IU] | Freq: Once | INTRAVENOUS | Status: AC
  Administered 2019-12-02: 500 [IU] via INTRAVENOUS
  Filled 2019-12-02: qty 5

## 2019-12-02 MED ORDER — SODIUM CHLORIDE 0.9% FLUSH
10.0000 mL | Freq: Once | INTRAVENOUS | Status: AC
  Administered 2019-12-02: 10 mL via INTRAVENOUS
  Filled 2019-12-02: qty 10

## 2019-12-02 NOTE — Progress Notes (Signed)
[facility] CONSULT NOTE  Patient Care Team: Derinda Late, MD as PCP - General (Family Medicine)  CHIEF COMPLAINTS/PURPOSE OF CONSULTATION: Breast lymphoma   Oncology History Overview Note  # AUG 2020- LEFT BREAST DLBCL [non-GCB subtype; Double expressor;NEG- CD-30; Ki-67-80-90%]; FISH -negative for translocations; SEP 2020- PET-left breast intense uptake; no distant disease noted.  Hold off bone marrow biopsy [patient/family preference not to be aggressive]; Stage adjusted IPI-"2 points" cure rate 50-60%.   # 9/21- Pred+Rituxan; OCT 2020- mini-R-CHOP; s/p radiation [feb 19th, 2021] -------------------------------------------------------------------------------------------- # 2006- DLBCL of Breast [s? 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    ;GOALS: Cure  CURRENT/MOST RECENT THERAPY:mini-RCHOP    Lymphoma of breast ([place])  05/25/2019 Initial Diagnosis   Lymphoma of breast ([place])   06/14/2019 -  Chemotherapy   The patient had DOXOrubicin (ADRIAMYCIN) chemo injection 36 mg, 25 mg/m2 = 36 mg (100 % of original dose 25 mg/m2), Intravenous,  Once, 4 of 4 cycles Dose modification: 25 mg/m2 (original dose 25 mg/m2, Cycle 2, Reason: Provider Judgment) Administration: 36 mg (07/05/2019), 36 mg (08/16/2019), 36 mg (07/26/2019), 36 mg (09/06/2019) palonosetron (ALOXI) injection 0.25 mg, 0.25 mg, Intravenous,  Once, 4 of 4 cycles Administration: 0.25 mg (07/05/2019), 0.25 mg (08/16/2019), 0.25 mg (07/26/2019), 0.25 mg (09/06/2019) pegfilgrastim-jmdb (FULPHILA) injection 6 mg, 6 mg, Subcutaneous,  Once, 4 of 4 cycles Administration: 6 mg (07/06/2019), 6 mg (08/17/2019), 6 mg (07/27/2019), 6 mg (09/07/2019) vinCRIStine (ONCOVIN) 2 mg in sodium chloride 0.9 % 50 mL chemo infusion, 2 mg, Intravenous,  Once, 4 of 4 cycles Administration: 2 mg  (07/05/2019), 2 mg (08/16/2019), 2 mg (07/26/2019), 2 mg (09/06/2019) riTUXimab (RITUXAN) 500 mg in sodium chloride 0.9 % 250 mL (1.6667 mg/mL) infusion, 375 mg/m2 = 500 mg, Intravenous,  Once, 1 of 1 cycle Administration: 500 mg (06/14/2019) cyclophosphamide (CYTOXAN) 580 mg in sodium chloride 0.9 % 250 mL chemo infusion, 400 mg/m2 = 580 mg (100 % of original dose 400 mg/m2), Intravenous,  Once, 4 of 4 cycles Dose modification: 600 mg/m2 (original dose 400 mg/m2, Cycle 2, Reason: Provider Judgment), 400 mg/m2 (original dose 400 mg/m2, Cycle 2, Reason: Provider Judgment) Administration: 580 mg (07/05/2019), 580 mg (08/16/2019), 580 mg (07/26/2019), 580 mg (09/06/2019) fosaprepitant (EMEND) 150 mg, dexamethasone (DECADRON) 12 mg in sodium chloride 0.9 % 145 mL IVPB, , Intravenous,  Once, 4 of 4 cycles Administration:  (07/05/2019),  (08/16/2019),  (07/26/2019),  (09/06/2019) riTUXimab-pvvr (RUXIENCE) 500 mg in sodium chloride 0.9 % 250 mL (1.6667 mg/mL) infusion, 375 mg/m2 = 500 mg (100 % of original dose 375 mg/m2), Intravenous,  Once, 1 of 1 cycle Dose modification: 375 mg/m2 (original dose 375 mg/m2, Cycle 2, Reason: Other (see comments), Comment: insurance)  for chemotherapy treatment.       HISTORY OF PRESENTING ILLNESS:  SEMONE ORLOV 86 y.o.  female diffuse large B-cell lymphoma of the left breast stage I E/recurrent currently on mini R-CHOP-is here for follow-up.  Patient overall doing well.  Denies any worsening lumps or bumps.  Continues to feel the lump in the left breast.  This is not getting any bigger.  No nausea no vomiting.  No headaches.  Review of Systems  Constitutional: Negative for chills, diaphoresis, fever and malaise/fatigue.  HENT: Negative for nosebleeds and sore throat.   Eyes: Negative for double vision.  Respiratory: Negative for cough,  hemoptysis, sputum production, shortness of breath and wheezing.   Cardiovascular: Negative for chest pain, palpitations, orthopnea  and leg swelling.  Gastrointestinal: Negative for abdominal pain, blood in stool, constipation, diarrhea, heartburn, melena, nausea and vomiting.  Genitourinary: Negative for dysuria, frequency and urgency.  Musculoskeletal: Positive for back pain and joint pain.  Skin: Negative.  Negative for itching and rash.  Neurological: Negative for dizziness, tingling, focal weakness, weakness and headaches.  Endo/Heme/Allergies: Does not bruise/bleed easily.  Psychiatric/Behavioral: Positive for memory loss. Negative for depression. The patient is not nervous/anxious and does not have insomnia.      MEDICAL HISTORY:  Past Medical History:  Diagnosis Date  . Breast cancer ([place])   . Hypertension   . Personal history of chemotherapy   . Personal history of radiation therapy     SURGICAL HISTORY: Past Surgical History:  Procedure Laterality Date  . BREAST BIOPSY Right ?   needle bx-benign  . BREAST BIOPSY Left 05/12/2019   [place] bx 12:00, ven[place] marker, path pending  . BREAST EXCISIONAL BIOPSY Left 2003   lymphoma  . BREAST EXCISIONAL BIOPSY Left ?   lump was removed, benign  . IR IMAGING GUIDED PORT INSERTION  06/02/2019    SOCIAL HISTORY: Social History   Socioeconomic History  . Marital status: Single    Spouse name: Not on file  . Number of children: 2  . Years of education: Not on file  . Highest education level: Not on file  Occupational History  . Not on file  Tobacco Use  . Smoking status: Never Smoker  . Smokeless tobacco: Never Used  Substance and Sexual Activity  . Alcohol use: No  . Drug use: Not on file  . Sexual activity: Not on file  Other Topics Concern  . Not on file  Social History Narrative   [facility]; own apartment; no smoking; no alcohol. 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    Social Determinants of Health   Financial Resource Strain: Low Risk   . Difficulty of Paying Living Expenses: Not hard at all  Food Insecurity: No Food Insecurity  . Worried About Sales executive in the Last Year: Never true  . Ran Out of Food in the Last Year: Never true  Transportation Needs: No Transportation Needs  . Lack of Transportation (Medical): No  . Lack of Transportation (Non-Medical): No  Physical Activity: Sufficiently Active  . Days of Exercise per Week: 7 days  . Minutes of Exercise per Session: 30 min  Stress: No Stress Concern Present  . Feeling of Stress : Only a little  Social Connections: Unknown  . Frequency of Communication with Friends and Family: More than three times a week  . Frequency of Social Gatherings with Friends and Family: More than three times a week  . Attends Religious Services: Not on file  . Active Member of Clubs or Organizations: No  . Attends Archivist Meetings: Never  . Marital Status: Not on file  Intimate Partner Violence: Not At Risk  . Fear of Current or Ex-Partner: No  . Emotionally Abused: No  . Physically Abused: No  . Sexually Abused: No    FAMILY HISTORY: Family History  Problem Relation Age of Onset  . Breast cancer Neg Hx     ALLERGIES:  has No Known Allergies.  MEDICATIONS:  Current Outpatient Medications  Medication Sig Dispense Refill  . acetaminophen (TYLENOL) 500 MG tablet Take 500 mg by mouth every 6 (six) hours as needed.    Marland Kitchen  amLODipine (NORVASC) 10 MG tablet Take 1 tablet by mouth daily.    . Calcium Carbonate-Vitamin D (CALTRATE 600+D PO) Take 1 tablet by mouth 2 (two) times daily.    Marland Kitchen docusate calcium (SURFAK) 240 MG capsule Take 1 capsule by mouth 2 (two) times daily.    Marland Kitchen donepezil (ARICEPT) 10 MG tablet Take 1 tablet by mouth at bedtime.    . fluticasone (FLONASE) 50 MCG/ACT nasal spray Place 2 sprays into both nostrils daily.    . hydrochlorothiazide (HYDRODIURIL) 25 MG tablet Take 1 tablet by mouth daily.    . irbesartan (AVAPRO) 300 MG tablet Take 1 tablet by mouth daily.    Marland Kitchen lidocaine-prilocaine (EMLA) cream Apply to affected area once 30 g 3  . MELATONIN PO Take 1 tablet by  mouth daily.    . meloxicam (MOBIC) 15 MG tablet Take 1 tablet by mouth as needed.    . Multiple Vitamin (MULTI-VITAMIN) tablet Take 1 tablet by mouth daily.    . Omega-3 Fatty Acids (FISH OIL) 1000 MG CAPS Take 1 tablet by mouth daily.    Marland Kitchen omeprazole (PRILOSEC) 20 MG capsule Take 1 capsule by mouth daily.    . ondansetron (ZOFRAN) 8 MG tablet Take 1 tablet (8 mg total) by mouth 2 (two) times daily as needed for refractory nausea / vomiting. 30 tablet 1  . predniSONE (DELTASONE) 20 MG tablet Take 3 tablets (60 mg total) by mouth daily with breakfast. Take on days 1-5 of chemotherapy. 60 tablet 3  . prochlorperazine (COMPAZINE) 10 MG tablet Take 1 tablet (10 mg total) by mouth every 6 (six) hours as needed (Nausea or vomiting). 30 tablet 6  . traZODone (DESYREL) 50 MG tablet Take 1 tablet by mouth 2 (two) times daily at 8 am and 10 pm.     No current facility-administered medications for this visit.   Facility-Administered Medications Ordered in Other Visits  Medication Dose Route Frequency Provider Last Rate Last Admin  . heparin lock flush 100 unit/mL  500 Units Intravenous Once Faythe Casa E, NP      . sodium chloride flush (NS) 0.9 % injection 10 mL  10 mL Intravenous PRN Jacquelin Hawking, NP          .  PHYSICAL EXAMINATION: ECOG PERFORMANCE STATUS: 1 - Symptomatic but completely ambulatory  Vitals:   12/02/19 1036  BP: (!) 146/77  Pulse: 76  Temp: (!) 97.4 F (36.3 C)   Filed Weights   12/02/19 1030 12/02/19 1036  Weight: 109 lb 2 oz (49.5 kg) 109 lb 3.2 oz (49.5 kg)    Physical Exam  Constitutional: She is oriented to person, place, and time and well-developed, well-nourished, and in no distress.  Accompanied by son.  She is walking herself.  HENT:  Head: Normocephalic and atraumatic.  Mouth/Throat: Oropharynx is clear and moist. No oropharyngeal exudate.  Eyes: Pupils are equal, round, and reactive to light.  Cardiovascular: Normal rate and regular rhythm.   Pulmonary/Chest: Effort normal and breath sounds normal. No respiratory distress. She has no wheezes.  Abdominal: Soft. Bowel sounds are normal. She exhibits no distension and no mass. There is no abdominal tenderness. There is no rebound and no guarding.  Musculoskeletal:        General: No tenderness or edema. Normal range of motion.     Cervical back: Normal range of motion and neck supple.  Neurological: She is alert and oriented to person, place, and time.  Skin: Skin is warm.  Left  breast upper inner quadrant- ~2.5cm ( improved from basleine-5.5 x 4.5 cm).   Psychiatric: Affect normal.     LABORATORY DATA:  I have reviewed the data as listed Lab Results  Component Value Date   WBC 5.8 12/02/2019   HGB 12.6 12/02/2019   HCT 39.0 12/02/2019   MCV 91.8 12/02/2019   PLT 213 12/02/2019   Recent Labs    09/06/19 0819 10/04/19 0910 12/02/19 1014  NA 135 137 135  K 3.7 4.2 4.2  CL 102 101 100  CO2 '24 26 26  ' GLUCOSE 121* 104* 100*  BUN 25* 26* 22  CREATININE 0.77 0.68 0.57  CALCIUM 9.3 9.7 9.8  GFRNONAA >60 >60 >60  GFRAA >60 >60 >60  PROT 6.8 7.0 6.7  ALBUMIN 4.0 4.2 4.2  AST '19 23 23  ' ALT '17 19 17  ' ALKPHOS 118 118 114  BILITOT 0.7 0.6 0.6    RADIOGRAPHIC STUDIES: I have personally reviewed the radiological images as listed and agreed with the findings in the report. No results found.  ASSESSMENT & PLAN:   Lymphoma of breast ([place]) #Diffuse large B-cell lymphoma left breast Non-GCB subtype; double expresser. Stage I E. S/p 4- R-miniCHOP; JAN 5th 2021- PET scan-[place] 2; however mass still approximately 2.5 cm in size noted; s/p consolidation radiation [finished Feb 19th 2021].    #Partial response noted-about 2.5 cm mass noted.  Will repeat imaging/PET scan in 3 months.  # Dementia: Stable continues to be fairly independent.  # Constipation: sec to chemo- on miralax;-stable  # port/IV access- stable; discussed re: pro and cons of keeping the port vs.  Explantation.  We will decide in the port explantation at next visit/after PET scan.  # I spoke at length with the patient's  son-regarding the patient's clinical status/plan of care.  Family agreement.    # DISPOSITION:  # Follow up in last week of May 2021-  MD;labs-cbc/cmp/LDH;port flush; PET prior- Dr.B.  Cc; Dr.Baboff    All questions were answered. The patient knows to call the clinic with any problems, questions or concerns.    Cammie Sickle, MD 12/05/2019 7:14 PM

## 2019-12-02 NOTE — Assessment & Plan Note (Addendum)
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 Stage I E. S/p 4- R-miniCHOP; JAN 5th 2021- PET scan-[place] 2; however mass still approximately 2.5 cm in size noted; s/p consolidation radiation [finished Feb 19th 2021].    #Partial response noted-about 2.5 cm mass noted.  Will repeat imaging/PET scan in 3 months.  # Dementia: Stable continues to be fairly independent.  # Constipation: sec to chemo- on miralax;-stable  # port/IV access- stable; discussed re: pro and cons of keeping the port vs. Explantation.  We will decide in the port explantation at next visit/after PET scan.  # I spoke at length with the patient's  son-regarding the patient's clinical status/plan of care.  Family agreement.    # DISPOSITION:  # Follow up in last week of May 2021-  MD;labs-cbc/cmp/LDH;port flush; PET prior- Dr.B.  Cc; [id number]

## 2019-12-02 NOTE — Progress Notes (Signed)
Radiation Oncology Follow up Note  Name: Stephanie Davidson   Date:   12/02/2019 MRN:  [id number] DOB: 03/12/1931    This 86 y.o. female presents to the clinic today for 1 month follow-up status post whole breast radiation to her left breast for diffuse large B-cell lymphoma stage Ia.  REFERRING PROVIDER: Derinda Late, MD  HPI: Patient is an 86 year old female now at 1 month having completed whole breast radiation to her left breast for stage Ia diffuse large cell B lymphoma seen today in routine follow-up she is doing well specifically denies breast tenderness cough or bone pain..  She had can finished R mini CHOP and we treated involved field since the mass was still approximately 2.5 cm.  COMPLICATIONS OF TREATMENT: none  FOLLOW UP COMPLIANCE: keeps appointments   PHYSICAL EXAM:  There were no vitals taken for this visit. Hard to perceive any mass in the left breast at this time.  No residual skin reaction noted no supraclavicular or axillary adenopathy is identified.  Well-developed well-nourished patient in NAD. HEENT reveals PERLA, EOMI, discs not visualized.  Oral cavity is clear. No oral mucosal lesions are identified. Neck is clear without evidence of cervical or supraclavicular adenopathy. Lungs are clear to A&P. Cardiac examination is essentially unremarkable with regular rate and rhythm without murmur rub or thrill. Abdomen is benign with no organomegaly or masses noted. Motor sensory and DTR levels are equal and symmetric in the upper and lower extremities. Cranial nerves II through XII are grossly intact. Proprioception is intact. No peripheral adenopathy or edema is identified. No motor or sensory levels are noted. Crude visual fields are within normal range.  RADIOLOGY RESULTS: No current films for review  PLAN: Present time patient is doing well almost complete response with radiation therapy.  She continues close follow-up care with medical oncology.  I have asked to see her  back in 4 to 5 months.  Patient and family know to call with any concerns.  I would like to take this opportunity to thank you for allowing me to participate in the care of your patient.Noreene Filbert, MD

## 2019-12-16 ENCOUNTER — Inpatient Hospital Stay (HOSPITAL_BASED_OUTPATIENT_CLINIC_OR_DEPARTMENT_OTHER): Payer: Medicare Other | Admitting: Hospice and Palliative Medicine

## 2019-12-16 DIAGNOSIS — Z515 Encounter for palliative care: Secondary | ICD-10-CM | POA: Diagnosis not present

## 2019-12-16 DIAGNOSIS — C8599 Non-Hodgkin lymphoma, unspecified, extranodal and solid organ sites: Secondary | ICD-10-CM

## 2019-12-16 NOTE — Progress Notes (Signed)
Virtual Visit via Telephone Note  I connected with Stephanie Davidson on 12/16/19 at  1:00 PM EDT by telephone and verified that I am speaking with the correct person using two identifiers.   I discussed the limitations, risks, security and privacy concerns of performing an evaluation and management service by telephone and the availability of in person appointments. I also discussed with the patient that there may be a patient responsible charge related to this service. The patient expressed understanding and agreed to proceed.   History of Present Illness: Stephanie Davidson is an 86 y.o. 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  Patient had severe symptoms from previous treatment and was initially reluctant to pursue further cancer treatment.  She agreed with mini-RCHOP. Patient was referred to palliative care to help address goals and provide with support.   Observations/Objective: Called spoke with patient by phone.  She reports doing extremely well.  She denies any symptomatic problems.  She denies any changes or concerns.  She says she is tolerating treatment well.  She is still quite active and says she walks a mile a day.  She reports good oral intake.  Sleeping well at night.  No depression or anxiety.  Assessment and Plan: Lymphoma - mini R-CHOP s/p RT.  Followed by Dr. Rogue Bussing with plan for PET in 3 months.  Previous partial response noted.  Seems to be doing well overall.  Follow Up Instructions: Follow-up telephone visit in 2 to 3 months   I discussed the assessment and treatment plan with the patient. The patient was provided an opportunity to ask questions and all were answered. The patient agreed with the plan and demonstrated an understanding of the instructions.   The patient was advised to call back or seek an in-person evaluation if the symptoms  worsen or if the condition fails to improve as anticipated.  I provided 5 minutes of non-face-to-face time during this encounter.   Irean Hong, NP

## 2019-12-29 ENCOUNTER — Non-Acute Institutional Stay: Payer: Medicare Other | Admitting: Adult Health Nurse Practitioner

## 2019-12-29 ENCOUNTER — Other Ambulatory Visit: Payer: Self-pay

## 2019-12-29 DIAGNOSIS — C8599 Non-Hodgkin lymphoma, unspecified, extranodal and solid organ sites: Secondary | ICD-10-CM

## 2019-12-29 DIAGNOSIS — Z515 Encounter for palliative care: Secondary | ICD-10-CM

## 2019-12-29 NOTE — Progress Notes (Signed)
[facility] Consult Note Telephone: [phone]  Fax: [phone]  PATIENT NAME: Stephanie Davidson DOB: 11/29/1930 MRN: [id number]  PRIMARY CARE PROVIDER:   Derinda Late, MD  REFERRING PROVIDER:  Altha Harm NP  RESPONSIBLE PARTY:   Becky Sax, daughter [phone]     RECOMMENDATIONS and PLAN:  1.  Advanced care planning.  Patient is full code. Does wish to have CPR but not prolonged CPR. Wishes to have full hospital interventions but does not want to stay of ventilation or life support for a prolonged period. Wants antibiotics and IV fluids as indicated and a feeding tube for a trial period.  2.  Lymphoma of breast.  Patient has had reduction of left breast tumor after chemo and radiation.  Son does state that oncologist believes that this could also just be scar tissue.  She still has her port in place for the infusions.  Will decide after her next follow up visit in May if going to keep the port.  Patient does state that even if she had a recurrence of the cancer that she may not want to undergo any more treatment for it.  This will be a future conversation if the cancer recurs.  She is doing well with no new complaints.  Denies weakness, pain, N/V/D, fever, SOB, cough, headaches dizziness.  Continue follow up and recommendations by oncology.  Palliative care will continue to monitor for symptom management/decline and make recommendations as needed. Will check in every 6-8 weeks with a phone call to see if a visit is needed. Encouraged to call sooner with questions or concerns.  I spent 46 minutes providing this consultation,  from 9:00 to 9:46 including time with patient/family, chart review, provider coordination, and documentation. More than 50% of the time in this consultation was spent coordinating communication.   HISTORY OF PRESENT ILLNESS:  Stephanie Davidson is a 86 y.o. year old female with multiple medical problems  including lymphoma of breast, early alzheimer's dementia, HTN.. Palliative Care was asked to help address goals of care.   CODE STATUS: full code  PPS: 60% HOSPICE ELIGIBILITY/DIAGNOSIS: TBD  PHYSICAL EXAM:  General: NAD, frail appearing, thin Cardiovascular: regular rate and rhythm Pulmonary:lung sounds clear; normal respiratory effort Extremities: no edema, no joint deformities Skin: no rasheson exposed skin Neurological: Weakness but otherwise nonfocal;does have some forgetfulness related to early Alzheimer's dementia  PAST MEDICAL HISTORY:  Past Medical History:  Diagnosis Date  . Breast cancer ([place])   . Hypertension   . Personal history of chemotherapy   . Personal history of radiation therapy     SOCIAL HX:  Social History   Tobacco Use  . Smoking status: Never Smoker  . Smokeless tobacco: Never Used  Substance Use Topics  . Alcohol use: No    ALLERGIES: No Known Allergies   PERTINENT MEDICATIONS:  Outpatient Encounter Medications as of 12/29/2019  Medication Sig  . acetaminophen (TYLENOL) 500 MG tablet Take 500 mg by mouth every 6 (six) hours as needed.  Marland Kitchen amLODipine (NORVASC) 10 MG tablet Take 1 tablet by mouth daily.  . Calcium Carbonate-Vitamin D (CALTRATE 600+D PO) Take 1 tablet by mouth 2 (two) times daily.  Marland Kitchen docusate calcium (SURFAK) 240 MG capsule Take 1 capsule by mouth 2 (two) times daily.  Marland Kitchen donepezil (ARICEPT) 10 MG tablet Take 1 tablet by mouth at bedtime.  . fluticasone (FLONASE) 50 MCG/ACT nasal spray Place 2 sprays into both nostrils daily.  . hydrochlorothiazide (HYDRODIURIL)  25 MG tablet Take 1 tablet by mouth daily.  . irbesartan (AVAPRO) 300 MG tablet Take 1 tablet by mouth daily.  Marland Kitchen lidocaine-prilocaine (EMLA) cream Apply to affected area once  . MELATONIN PO Take 1 tablet by mouth daily.  . meloxicam (MOBIC) 15 MG tablet Take 1 tablet by mouth as needed.  . Multiple Vitamin (MULTI-VITAMIN) tablet Take 1 tablet by mouth daily.  .  Omega-3 Fatty Acids (FISH OIL) 1000 MG CAPS Take 1 tablet by mouth daily.  Marland Kitchen omeprazole (PRILOSEC) 20 MG capsule Take 1 capsule by mouth daily.  . ondansetron (ZOFRAN) 8 MG tablet Take 1 tablet (8 mg total) by mouth 2 (two) times daily as needed for refractory nausea / vomiting.  . predniSONE (DELTASONE) 20 MG tablet Take 3 tablets (60 mg total) by mouth daily with breakfast. Take on days 1-5 of chemotherapy.  . prochlorperazine (COMPAZINE) 10 MG tablet Take 1 tablet (10 mg total) by mouth every 6 (six) hours as needed (Nausea or vomiting).  . traZODone (DESYREL) 50 MG tablet Take 1 tablet by mouth 2 (two) times daily at 8 am and 10 pm.   Facility-Administered Encounter Medications as of 12/29/2019  Medication  . heparin lock flush 100 unit/mL  . sodium chloride flush (NS) 0.9 % injection 10 mL      Kellan Boehlke Jenetta Downer, NP

## 2020-02-15 ENCOUNTER — Inpatient Hospital Stay: Payer: Medicare Other | Attending: Hospice and Palliative Medicine | Admitting: Hospice and Palliative Medicine

## 2020-02-15 DIAGNOSIS — Z515 Encounter for palliative care: Secondary | ICD-10-CM | POA: Diagnosis not present

## 2020-02-15 DIAGNOSIS — C8599 Non-Hodgkin lymphoma, unspecified, extranodal and solid organ sites: Secondary | ICD-10-CM | POA: Diagnosis not present

## 2020-02-15 DIAGNOSIS — Z923 Personal history of irradiation: Secondary | ICD-10-CM | POA: Insufficient documentation

## 2020-02-15 NOTE — Progress Notes (Signed)
Virtual Visit via Telephone Note  I connected with Stephanie Davidson on 02/15/20 at 11:30 AM EDT by telephone and verified that I am speaking with the correct person using two identifiers.   I discussed the limitations, risks, security and privacy concerns of performing an evaluation and management service by telephone and the availability of in person appointments. I also discussed with the patient that there may be a patient responsible charge related to this service. The patient expressed understanding and agreed to proceed.   History of Present Illness: [name] is an 86 y.o. 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  Patient had severe symptoms from previous treatment and was initially reluctant to pursue further cancer treatment.  She agreed with mini-RCHOP. Patient was referred to palliative care to help address goals and provide with support.   Observations/Objective: I called and spoke with patient by phone.  She reports that she is doing "extremely well."  She denies any significant changes or concerns.  She denies any symptomatic complaints today.  She says she is walking at least a mile a day.  She is eating well.  No issues with medications.  Plan is for PET scan later this week and then a follow-up with Dr. Rogue Bussing on Thursday.  Assessment and Plan: Lymphoma -status post mini R-CHOP and RT.  Followed by Dr. Rogue Bussing.  Seems to be doing well overall.  Follow Up Instructions: Follow-up virtual visit in 2 to 3 months   I discussed the assessment and treatment plan with the patient. The patient was provided an opportunity to ask questions and all were answered. The patient agreed with the plan and demonstrated an understanding of the instructions.   The patient was advised to call back or seek an in-person evaluation if the symptoms worsen or if the  condition fails to improve as anticipated.  I provided 5 minutes of non-face-to-face time during this encounter.   Irean Hong, NP

## 2020-02-16 ENCOUNTER — Other Ambulatory Visit: Payer: Self-pay

## 2020-02-16 ENCOUNTER — Ambulatory Visit
Admission: RE | Admit: 2020-02-16 | Discharge: 2020-02-16 | Disposition: A | Discharge status: Home / Self Care | Payer: Medicare Other | Source: Ambulatory Visit | Attending: Internal Medicine | Admitting: Internal Medicine

## 2020-02-16 DIAGNOSIS — C8599 Non-Hodgkin lymphoma, unspecified, extranodal and solid organ sites: Secondary | ICD-10-CM | POA: Insufficient documentation

## 2020-02-16 DIAGNOSIS — I700 Atherosclerosis of aorta: Secondary | ICD-10-CM | POA: Insufficient documentation

## 2020-02-16 DIAGNOSIS — Z923 Personal history of irradiation: Secondary | ICD-10-CM | POA: Diagnosis not present

## 2020-02-16 DIAGNOSIS — Z9221 Personal history of antineoplastic chemotherapy: Secondary | ICD-10-CM | POA: Diagnosis not present

## 2020-02-16 LAB — GLUCOSE, CAPILLARY: Glucose-Capillary: 85 mg/dL (ref 70–99)

## 2020-02-16 MED ORDER — FLUDEOXYGLUCOSE F - 18 (FDG) INJECTION
5.6000 | Freq: Once | INTRAVENOUS | Status: AC | PRN
  Administered 2020-02-16: 6 via INTRAVENOUS

## 2020-02-17 ENCOUNTER — Inpatient Hospital Stay (HOSPITAL_BASED_OUTPATIENT_CLINIC_OR_DEPARTMENT_OTHER): Payer: Medicare Other | Admitting: Internal Medicine

## 2020-02-17 ENCOUNTER — Inpatient Hospital Stay: Payer: Medicare Other

## 2020-02-17 DIAGNOSIS — Z923 Personal history of irradiation: Secondary | ICD-10-CM | POA: Diagnosis not present

## 2020-02-17 DIAGNOSIS — C8599 Non-Hodgkin lymphoma, unspecified, extranodal and solid organ sites: Secondary | ICD-10-CM

## 2020-02-17 LAB — COMPREHENSIVE METABOLIC PANEL
ALT: 16 U/L (ref 0–44)
AST: 20 U/L (ref 15–41)
Albumin: 4.2 g/dL (ref 3.5–5.0)
Alkaline Phosphatase: 112 U/L (ref 38–126)
Anion gap: 9 (ref 5–15)
BUN: 21 mg/dL (ref 8–23)
CO2: 25 mmol/L (ref 22–32)
Calcium: 9.3 mg/dL (ref 8.9–10.3)
Chloride: 101 mmol/L (ref 98–111)
Creatinine, Ser: 0.65 mg/dL (ref 0.44–1.00)
GFR calc Af Amer: >60 mL/min (ref >60)
GFR calc non Af Amer: >60 mL/min (ref >60)
Glucose, Bld: 96 mg/dL (ref 70–99)
Potassium: 4.1 mmol/L (ref 3.5–5.1)
Sodium: 135 mmol/L (ref 135–145)
Total Bilirubin: 0.8 mg/dL (ref 0.3–1.2)
Total Protein: 6.7 g/dL (ref 6.5–8.1)

## 2020-02-17 LAB — CBC WITH DIFFERENTIAL/PLATELET
Abs Immature Granulocytes: 0.01 10*3/uL (ref 0.00–0.07)
Basophils Absolute: 0 10*3/uL (ref 0.0–0.1)
Basophils Relative: 0 %
Eosinophils Absolute: 0.2 10*3/uL (ref 0.0–0.5)
Eosinophils Relative: 3 %
HCT: 35.1 % — ABNORMAL LOW (ref 36.0–46.0)
Hemoglobin: 11.8 g/dL — ABNORMAL LOW (ref 12.0–15.0)
Immature Granulocytes: 0 %
Lymphocytes Relative: 22 %
Lymphs Abs: 1.5 10*3/uL (ref 0.7–4.0)
MCH: 30.1 pg (ref 26.0–34.0)
MCHC: 33.6 g/dL (ref 30.0–36.0)
MCV: 89.5 fL (ref 80.0–100.0)
Monocytes Absolute: 0.5 10*3/uL (ref 0.1–1.0)
Monocytes Relative: 8 %
Neutro Abs: 4.5 10*3/uL (ref 1.7–7.7)
Neutrophils Relative %: 67 %
Platelets: 223 10*3/uL (ref 150–400)
RBC: 3.92 MIL/uL (ref 3.87–5.11)
RDW: 14.3 % (ref 11.5–15.5)
WBC: 6.8 10*3/uL (ref 4.0–10.5)
nRBC: 0 % (ref 0.0–0.2)

## 2020-02-17 LAB — LACTATE DEHYDROGENASE: LDH: 180 U/L (ref 98–192)

## 2020-02-17 MED ORDER — SODIUM CHLORIDE 0.9% FLUSH
10.0000 mL | Freq: Once | INTRAVENOUS | Status: AC
  Administered 2020-02-17: 10 mL via INTRAVENOUS
  Filled 2020-02-17: qty 10

## 2020-02-17 MED ORDER — HEPARIN SOD (PORK) LOCK FLUSH 100 UNIT/ML IV SOLN
500.0000 [IU] | Freq: Once | INTRAVENOUS | Status: AC
  Administered 2020-02-17: 500 [IU] via INTRAVENOUS
  Filled 2020-02-17: qty 5

## 2020-02-17 NOTE — Progress Notes (Signed)
[facility] CONSULT NOTE  Patient Care Team: Derinda Late, MD as PCP - General (Family Medicine)  CHIEF COMPLAINTS/PURPOSE OF CONSULTATION: Breast lymphoma   Oncology History Overview Note  # AUG 2020- LEFT BREAST DLBCL [non-GCB subtype; Double expressor;NEG- CD-30; Ki-67-80-90%]; FISH -negative for translocations; SEP 2020- PET-left breast intense uptake; no distant disease noted.  Hold off bone marrow biopsy [patient/family preference not to be aggressive]; Stage adjusted IPI-"2 points" cure rate 50-60%.   # 9/21- Pred+Rituxan; OCT 2020- mini-R-CHOP; s/p radiation [feb 19th, 2021] -------------------------------------------------------------------------------------------- # 2006- DLBCL of Breast [s? 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    ;GOALS: Cure  CURRENT/MOST RECENT THERAPY:mini-RCHOP    Lymphoma of breast ([place])  05/25/2019 Initial Diagnosis   Lymphoma of breast ([place])   06/14/2019 -  Chemotherapy   The patient had DOXOrubicin (ADRIAMYCIN) chemo injection 36 mg, 25 mg/m2 = 36 mg (100 % of original dose 25 mg/m2), Intravenous,  Once, 4 of 4 cycles Dose modification: 25 mg/m2 (original dose 25 mg/m2, Cycle 2, Reason: Provider Judgment) Administration: 36 mg (07/05/2019), 36 mg (08/16/2019), 36 mg (07/26/2019), 36 mg (09/06/2019) palonosetron (ALOXI) injection 0.25 mg, 0.25 mg, Intravenous,  Once, 4 of 4 cycles Administration: 0.25 mg (07/05/2019), 0.25 mg (08/16/2019), 0.25 mg (07/26/2019), 0.25 mg (09/06/2019) pegfilgrastim-jmdb (FULPHILA) injection 6 mg, 6 mg, Subcutaneous,  Once, 4 of 4 cycles Administration: 6 mg (07/06/2019), 6 mg (08/17/2019), 6 mg (07/27/2019), 6 mg (09/07/2019) vinCRIStine (ONCOVIN) 2 mg in sodium chloride 0.9 % 50 mL chemo infusion, 2 mg, Intravenous,  Once, 4 of 4 cycles Administration: 2 mg  (07/05/2019), 2 mg (08/16/2019), 2 mg (07/26/2019), 2 mg (09/06/2019) riTUXimab (RITUXAN) 500 mg in sodium chloride 0.9 % 250 mL (1.6667 mg/mL) infusion, 375 mg/m2 = 500 mg, Intravenous,  Once, 1 of 1 cycle Administration: 500 mg (06/14/2019) cyclophosphamide (CYTOXAN) 580 mg in sodium chloride 0.9 % 250 mL chemo infusion, 400 mg/m2 = 580 mg (100 % of original dose 400 mg/m2), Intravenous,  Once, 4 of 4 cycles Dose modification: 600 mg/m2 (original dose 400 mg/m2, Cycle 2, Reason: Provider Judgment), 400 mg/m2 (original dose 400 mg/m2, Cycle 2, Reason: Provider Judgment) Administration: 580 mg (07/05/2019), 580 mg (08/16/2019), 580 mg (07/26/2019), 580 mg (09/06/2019) fosaprepitant (EMEND) 150 mg, dexamethasone (DECADRON) 12 mg in sodium chloride 0.9 % 145 mL IVPB, , Intravenous,  Once, 4 of 4 cycles Administration:  (07/05/2019),  (08/16/2019),  (07/26/2019),  (09/06/2019) riTUXimab-pvvr (RUXIENCE) 500 mg in sodium chloride 0.9 % 250 mL (1.6667 mg/mL) infusion, 375 mg/m2 = 500 mg (100 % of original dose 375 mg/m2), Intravenous,  Once, 1 of 1 cycle Dose modification: 375 mg/m2 (original dose 375 mg/m2, Cycle 2, Reason: Other (see comments), Comment: insurance)  for chemotherapy treatment.       HISTORY OF PRESENTING ILLNESS:  Stephanie Davidson 86 y.o.  female diffuse large B-cell lymphoma of the left breast stage I E/recurrent currently on mini R-CHOP-is here for follow-up/review results of the PET scan.  Patient denies any new lumps or bumps.  Denies any any weight loss denies any nausea vomiting headache.  Review of Systems  Constitutional: Negative for chills, diaphoresis, fever and malaise/fatigue.  HENT: Negative for nosebleeds and sore throat.   Eyes: Negative for double vision.  Respiratory: Negative for cough, hemoptysis, sputum production, shortness of breath and wheezing.   Cardiovascular: Negative for  chest pain, palpitations, orthopnea and leg swelling.  Gastrointestinal: Negative for  abdominal pain, blood in stool, constipation, diarrhea, heartburn, melena, nausea and vomiting.  Genitourinary: Negative for dysuria, frequency and urgency.  Musculoskeletal: Positive for back pain and joint pain.  Skin: Negative.  Negative for itching and rash.  Neurological: Negative for dizziness, tingling, focal weakness, weakness and headaches.  Endo/Heme/Allergies: Does not bruise/bleed easily.  Psychiatric/Behavioral: Positive for memory loss. Negative for depression. The patient is not nervous/anxious and does not have insomnia.      MEDICAL HISTORY:  Past Medical History:  Diagnosis Date  . Breast cancer ([place])   . Hypertension   . Personal history of chemotherapy   . Personal history of radiation therapy     SURGICAL HISTORY: Past Surgical History:  Procedure Laterality Date  . BREAST BIOPSY Right ?   needle bx-benign  . BREAST BIOPSY Left 05/12/2019   [place] bx 12:00, [place] marker, path pending  . BREAST EXCISIONAL BIOPSY Left 2003   lymphoma  . BREAST EXCISIONAL BIOPSY Left ?   lump was removed, benign  . IR IMAGING GUIDED PORT INSERTION  06/02/2019    SOCIAL HISTORY: Social History   Socioeconomic History  . Marital status: Single    Spouse name: Not on file  . Number of children: 2  . Years of education: Not on file  . Highest education level: Not on file  Occupational History  . Not on file  Tobacco Use  . Smoking status: Never Smoker  . Smokeless tobacco: Never Used  Substance and Sexual Activity  . Alcohol use: No  . Drug use: Not on file  . Sexual activity: Not on file  Other Topics Concern  . Not on file  Social History Narrative   [facility]; own apartment; no smoking; no alcohol. 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    Social Determinants of Health   Financial Resource Strain: Low Risk   . Difficulty of Paying Living Expenses: Not hard at all  Food Insecurity: No Food Insecurity  . Worried About Charity fundraiser in the Last Year: Never true  . Ran Out of Food  in the Last Year: Never true  Transportation Needs: No Transportation Needs  . Lack of Transportation (Medical): No  . Lack of Transportation (Non-Medical): No  Physical Activity: Sufficiently Active  . Days of Exercise per Week: 7 days  . Minutes of Exercise per Session: 30 min  Stress: No Stress Concern Present  . Feeling of Stress : Only a little  Social Connections: Unknown  . Frequency of Communication with Friends and Family: More than three times a week  . Frequency of Social Gatherings with Friends and Family: More than three times a week  . Attends Religious Services: Not on file  . Active Member of Clubs or Organizations: No  . Attends Archivist Meetings: Never  . Marital Status: Not on file  Intimate Partner Violence: Not At Risk  . Fear of Current or Ex-Partner: No  . Emotionally Abused: No  . Physically Abused: No  . Sexually Abused: No    FAMILY HISTORY: Family History  Problem Relation Age of Onset  . Breast cancer Neg Hx     ALLERGIES:  has No Known Allergies.  MEDICATIONS:  Current Outpatient Medications  Medication Sig Dispense Refill  . acetaminophen (TYLENOL) 500 MG tablet Take 500 mg by mouth every 6 (six) hours as needed.    Marland Kitchen amLODipine (NORVASC) 10 MG tablet Take 1 tablet by mouth daily.    Marland Kitchen  Calcium Carbonate-Vitamin D (CALTRATE 600+D PO) Take 1 tablet by mouth 2 (two) times daily.    Marland Kitchen docusate calcium (SURFAK) 240 MG capsule Take 1 capsule by mouth 2 (two) times daily.    Marland Kitchen donepezil (ARICEPT) 10 MG tablet Take 1 tablet by mouth at bedtime.    . fluticasone (FLONASE) 50 MCG/ACT nasal spray Place 2 sprays into both nostrils daily.    . hydrochlorothiazide (HYDRODIURIL) 25 MG tablet Take 1 tablet by mouth daily.    . irbesartan (AVAPRO) 300 MG tablet Take 1 tablet by mouth daily.    Marland Kitchen lidocaine-prilocaine (EMLA) cream Apply to affected area once 30 g 3  . MELATONIN PO Take 1 tablet by mouth daily.    . meloxicam (MOBIC) 15 MG tablet Take  1 tablet by mouth as needed.    . Multiple Vitamin (MULTI-VITAMIN) tablet Take 1 tablet by mouth daily.    . Omega-3 Fatty Acids (FISH OIL) 1000 MG CAPS Take 1 tablet by mouth daily.    Marland Kitchen omeprazole (PRILOSEC) 20 MG capsule Take 1 capsule by mouth daily.    . traZODone (DESYREL) 50 MG tablet Take 1 tablet by mouth 2 (two) times daily at 8 am and 10 pm.    . ondansetron (ZOFRAN) 8 MG tablet Take 1 tablet (8 mg total) by mouth 2 (two) times daily as needed for refractory nausea / vomiting. (Patient not taking: Reported on 02/17/2020) 30 tablet 1  . predniSONE (DELTASONE) 20 MG tablet Take 3 tablets (60 mg total) by mouth daily with breakfast. Take on days 1-5 of chemotherapy. (Patient not taking: Reported on 02/17/2020) 60 tablet 3  . prochlorperazine (COMPAZINE) 10 MG tablet Take 1 tablet (10 mg total) by mouth every 6 (six) hours as needed (Nausea or vomiting). (Patient not taking: Reported on 02/17/2020) 30 tablet 6   No current facility-administered medications for this visit.   Facility-Administered Medications Ordered in Other Visits  Medication Dose Route Frequency Provider Last Rate Last Admin  . heparin lock flush 100 unit/mL  500 Units Intravenous Once Faythe Casa E, NP      . sodium chloride flush (NS) 0.9 % injection 10 mL  10 mL Intravenous PRN Jacquelin Hawking, NP          .  PHYSICAL EXAMINATION: ECOG PERFORMANCE STATUS: 1 - Symptomatic but completely ambulatory  Vitals:   02/17/20 1107  BP: (!) 154/63  Pulse: 79  Resp: 18  Temp: 97.6 F (36.4 C)  SpO2: 100%   Filed Weights   02/17/20 1107  Weight: 112 lb (50.8 kg)    Physical Exam  Constitutional: She is oriented to person, place, and time and well-developed, well-nourished, and in no distress.  Accompanied by son.  She is walking herself.  HENT:  Head: Normocephalic and atraumatic.  Mouth/Throat: Oropharynx is clear and moist. No oropharyngeal exudate.  Eyes: Pupils are equal, round, and reactive to light.   Cardiovascular: Normal rate and regular rhythm.  Pulmonary/Chest: Effort normal and breath sounds normal. No respiratory distress. She has no wheezes.  Abdominal: Soft. Bowel sounds are normal. She exhibits no distension and no mass. There is no abdominal tenderness. There is no rebound and no guarding.  Musculoskeletal:        General: No tenderness or edema. Normal range of motion.     Cervical back: Normal range of motion and neck supple.  Neurological: She is alert and oriented to person, place, and time.  Skin: Skin is warm.  Left breast upper  inner quadrant- ~2.5cm ( improved from basleine-5.5 x 4.5 cm).   Psychiatric: Affect normal.     LABORATORY DATA:  I have reviewed the data as listed Lab Results  Component Value Date   WBC 6.8 02/17/2020   HGB 11.8 (L) 02/17/2020   HCT 35.1 (L) 02/17/2020   MCV 89.5 02/17/2020   PLT 223 02/17/2020   Recent Labs    10/04/19 0910 12/02/19 1014 02/17/20 1022  NA 137 135 135  K 4.2 4.2 4.1  CL 101 100 101  CO2 '26 26 25  ' GLUCOSE 104* 100* 96  BUN 26* 22 21  CREATININE 0.68 0.57 0.65  CALCIUM 9.7 9.8 9.3  GFRNONAA >60 >60 >60  GFRAA >60 >60 >60  PROT 7.0 6.7 6.7  ALBUMIN 4.2 4.2 4.2  AST '23 23 20  ' ALT '19 17 16  ' ALKPHOS 118 114 112  BILITOT 0.6 0.6 0.8    RADIOGRAPHIC STUDIES: I have personally reviewed the radiological images as listed and agreed with the findings in the report. NM PET Image Restag (PS) Skull Base To Thigh  Result Date: 02/16/2020 CLINICAL DATA:  Subsequent treatment strategy for lymphoma of the LEFT breast. Last chemotherapy in January 2021 with radiation therapy in February of 2021. EXAM: NUCLEAR MEDICINE PET SKULL BASE TO THIGH TECHNIQUE: 6 mCi F-18 FDG was injected intravenously. Full-ring PET imaging was performed from the skull base to thigh after the radiotracer. CT data was obtained and used for attenuation correction and anatomic localization. Fasting blood glucose: 85 mg/dl COMPARISON:  09/29/2019  FINDINGS: Mediastinal blood pool activity: SUV max 2.02 Liver activity: SUV max 2.72 NECK: No hypermetabolic lymph nodes in the neck. Incidental CT findings: 2.2 cm RIGHT thyroid lesion without metabolic activity and with subtle internal calcifications not changed from the previous exam. CHEST: RIGHT paratracheal lymph node anterior to the carina (image 82, series 3) (SUVmax = 3.2) previously 2.5 No hypermetabolic activity in the medial upper portion of the LEFT breast. Greatest SUV in this area approximately 1.7 previously 1.8 Signs of prior radiation along the LEFT chest wall but with more nodular focus in the peripheral LEFT chest (image 90, series 3) 6 mm (SUVmax = 1.3) no nodule on the previous exam in this location. Calcified nodule in the LEFT upper lobe. Incidental CT findings: RIGHT IJ Port-A-Cath. Terminating at the caval to atrial junction. Extensive atherosclerotic plaque throughout the thoracic aorta. No aneurysm. Heart size mildly enlarged without pericardial effusion. Coronary artery disease. Basilar atelectasis.  No effusion.  No consolidation. ABDOMEN/PELVIS: No abnormal hypermetabolic activity within the liver, pancreas, adrenal glands, or spleen. No hypermetabolic lymph nodes in the abdomen or pelvis. Incidental CT findings: Stable low-attenuation focus in the dome of the RIGHT hemi liver without hypermetabolic features. No pericholecystic stranding. Pancreas grossly normal. Normal contour the spleen which is unenlarged. LEFT renal cysts.  No hydronephrosis or perinephric stranding. No acute bowel process.  Colonic diverticulosis. SKELETON: No focal hypermetabolic activity to suggest skeletal metastasis. Incidental CT findings: Spinal degenerative changes. Prior RIGHT femoral neck ORIF as before. IMPRESSION: 1. No residual activity noted in the area of the LEFT breast. [place] 2. 2. Mild increased uptake associated with a stable sized RIGHT paratracheal lymph node. Significance uncertain.  Slightly greater than blood pool and liver activity. Potentially reactive though strictly speaking residual/recurrent disease is not entirely excluded. Short interval follow-up may be helpful. 3. New small nodule in the LEFT chest adjacent to post radiation changes could be related to mild pneumonitis. A short interval follow-up  may also be helpful for assessing this area. Aortic Atherosclerosis (ICD10-I70.0). Electronically Signed   By: Zetta Bills M.D.   On: 02/16/2020 13:34    ASSESSMENT & PLAN:   Lymphoma of breast ([place]) # Diffuse large B-cell lymphoma left breast Non-GCB subtype; double expresser. Stage I E. S/p 4- R-mini CHOP; s/p consolidation radiation [finished Feb 19th 2021]. May 2021- PET scan complete response noted in the left breast; slight uptake noted in the mediastinal lymph nodes-see below.  #Mediastinal lymph nodes/paratracheal mild uptake-question reactive versus progressive malignancy [clinically less likely].  Given age/preference I think is reasonable to monitor with repeat PET scan in 3 months.  # Dementia: Stable; continues to be fairly independent.  # port/IV access-stable ; discussed re: pro and cons of keeping the port vs. Explantation.  We will get a port for now.  I spoke at length with the patient's  son- regarding the patient's clinical status/plan of care.  Family agreement.   # DISPOSITION:  # Follow up in last week of AUG 2021 2021-  MD;labs-cbc/cmp/LDH;port flush; PET prior- Dr.B.  Cc; Dr.Baboff  # I reviewed the blood work- with the patient in detail; also reviewed the imaging independently [as summarized above]; and with the patient in detail.    All questions were answered. The patient knows to call the clinic with any problems, questions or concerns.    Cammie Sickle, MD 02/21/2020 6:32 PM

## 2020-02-17 NOTE — Assessment & Plan Note (Addendum)
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 Stage I E. S/p 4- R-mini CHOP; s/p consolidation radiation [finished Feb 19th 2021]. May 2021- PET scan complete response noted in the left breast; slight uptake noted in the mediastinal lymph nodes-see below.  #Mediastinal lymph nodes/paratracheal mild uptake-question reactive versus progressive malignancy [clinically less likely].  Given age/preference I think is reasonable to monitor with repeat PET scan in 3 months.  # Dementia: Stable; continues to be fairly independent.  # port/IV access-stable ; discussed re: pro and cons of keeping the port vs. Explantation.  We will get a port for now.  I spoke at length with the patient's  son- regarding the patient's clinical status/plan of care.  Family agreement.   # DISPOSITION:  # Follow up in last week of AUG 2021 2021-  MD;labs-cbc/cmp/LDH;port flush; PET prior- Dr.B.  Cc; Dr.Baboff  # I reviewed the blood work- with the patient in detail; also reviewed the imaging independently [as summarized above]; and with the patient in detail.

## 2020-02-17 NOTE — Progress Notes (Signed)
Pt and son in for follow up, denies any difficulties or concerns today.

## 2020-02-24 ENCOUNTER — Telehealth: Payer: Self-pay | Admitting: Adult Health Nurse Practitioner

## 2020-02-24 NOTE — Telephone Encounter (Signed)
Called daughter to see how her mother is doing and if wanted to schedule a follow up visit.  Left VM with reason for call and call back info Montanna Mcbain K. Olena Heckle NP

## 2020-04-16 ENCOUNTER — Encounter: Payer: Self-pay | Admitting: Internal Medicine

## 2020-04-18 ENCOUNTER — Encounter: Payer: Self-pay | Admitting: Hospice and Palliative Medicine

## 2020-04-18 ENCOUNTER — Inpatient Hospital Stay: Payer: Medicare Other | Attending: Hospice and Palliative Medicine | Admitting: Hospice and Palliative Medicine

## 2020-04-18 ENCOUNTER — Other Ambulatory Visit: Payer: Self-pay

## 2020-04-18 DIAGNOSIS — C8599 Non-Hodgkin lymphoma, unspecified, extranodal and solid organ sites: Secondary | ICD-10-CM

## 2020-04-18 DIAGNOSIS — Z515 Encounter for palliative care: Secondary | ICD-10-CM | POA: Diagnosis not present

## 2020-04-18 NOTE — Progress Notes (Signed)
Virtual Visit via Telephone Note  I connected with Stephanie Davidson on 04/18/20 at 11:30 AM EDT by telephone and verified that I am speaking with the correct person using two identifiers.   I discussed the limitations, risks, security and privacy concerns of performing an evaluation and management service by telephone and the availability of in person appointments. I also discussed with the patient that there may be a patient responsible charge related to this service. The patient expressed understanding and agreed to proceed.   History of Present Illness: Stephanie Davidson is an 86 y.o. 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  Patient had severe symptoms from previous treatment and was initially reluctant to pursue further cancer treatment.  She agreed with mini-RCHOP. Patient was referred to palliative care to help address goals and provide with support.   Observations/Objective: I called and spoke with patient by phone.  Patient reports she is doing well.  She denies any significant changes or concerns.  She says she continues to walk about a mile each day.  She is eating well.  No symptomatic complaints today.  No need for refills on medications.  Assessment and Plan: Lymphoma -status post mini R-CHOP and RT.  Followed by Dr. Rogue Bussing.  Seems to be doing well overall.  Patient is pending PET scan next month with follow-up visit in September with Dr. Rogue Bussing.  We will plan to see her at that time as well.  Follow Up Instructions: RTC in 2 months   I discussed the assessment and treatment plan with the patient. The patient was provided an opportunity to ask questions and all were answered. The patient agreed with the plan and demonstrated an understanding of the instructions.   The patient was advised to call back or seek an in-person evaluation if the symptoms  worsen or if the condition fails to improve as anticipated.  I provided 5 minutes of non-face-to-face time during this encounter.   Irean Hong, NP

## 2020-04-19 ENCOUNTER — Encounter: Payer: Self-pay | Admitting: Radiation Oncology

## 2020-05-15 ENCOUNTER — Ambulatory Visit: Payer: Medicare Other | Admitting: Radiation Oncology

## 2020-05-16 ENCOUNTER — Other Ambulatory Visit: Payer: Self-pay

## 2020-05-16 ENCOUNTER — Ambulatory Visit
Admission: RE | Admit: 2020-05-16 | Discharge: 2020-05-16 | Disposition: A | Discharge status: Home / Self Care | Payer: Medicare Other | Source: Ambulatory Visit | Attending: Internal Medicine | Admitting: Internal Medicine

## 2020-05-16 DIAGNOSIS — I2510 Atherosclerotic heart disease of native coronary artery without angina pectoris: Secondary | ICD-10-CM | POA: Insufficient documentation

## 2020-05-16 DIAGNOSIS — C8599 Non-Hodgkin lymphoma, unspecified, extranodal and solid organ sites: Secondary | ICD-10-CM

## 2020-05-16 DIAGNOSIS — C8528 Mediastinal (thymic) large B-cell lymphoma, lymph nodes of multiple sites: Secondary | ICD-10-CM | POA: Diagnosis not present

## 2020-05-16 DIAGNOSIS — I700 Atherosclerosis of aorta: Secondary | ICD-10-CM | POA: Insufficient documentation

## 2020-05-16 LAB — GLUCOSE, CAPILLARY: Glucose-Capillary: 85 mg/dL (ref 70–99)

## 2020-05-16 MED ORDER — FLUDEOXYGLUCOSE F - 18 (FDG) INJECTION
5.8000 | Freq: Once | INTRAVENOUS | Status: AC | PRN
  Administered 2020-05-16: 6.2 via INTRAVENOUS

## 2020-05-18 ENCOUNTER — Ambulatory Visit: Payer: Medicare Other | Admitting: Internal Medicine

## 2020-05-18 ENCOUNTER — Other Ambulatory Visit: Payer: Medicare Other

## 2020-05-24 ENCOUNTER — Telehealth: Payer: Self-pay | Admitting: Internal Medicine

## 2020-05-24 NOTE — Telephone Encounter (Signed)
I left voicemail for the son, Kim-regarding the use of the PET scan-no major concerns for recurrence/of progression of lymphoma.  Incidental findings discussed.  Recommend call us if any questions.  Follow-up as planned

## 2020-06-15 ENCOUNTER — Encounter: Payer: Self-pay | Admitting: *Deleted

## 2020-06-22 ENCOUNTER — Ambulatory Visit
Admission: RE | Admit: 2020-06-22 | Discharge: 2020-06-22 | Disposition: A | Discharge status: Home / Self Care | Payer: Medicare Other | Source: Ambulatory Visit | Attending: Radiation Oncology | Admitting: Radiation Oncology

## 2020-06-22 ENCOUNTER — Inpatient Hospital Stay (HOSPITAL_BASED_OUTPATIENT_CLINIC_OR_DEPARTMENT_OTHER): Payer: Medicare Other | Admitting: Internal Medicine

## 2020-06-22 ENCOUNTER — Encounter: Payer: Medicare Other | Admitting: Hospice and Palliative Medicine

## 2020-06-22 ENCOUNTER — Other Ambulatory Visit: Payer: Self-pay

## 2020-06-22 ENCOUNTER — Encounter: Payer: Self-pay | Admitting: Radiation Oncology

## 2020-06-22 ENCOUNTER — Encounter: Payer: Self-pay | Admitting: Internal Medicine

## 2020-06-22 ENCOUNTER — Inpatient Hospital Stay: Payer: Medicare Other | Attending: Internal Medicine

## 2020-06-22 VITALS — BP 136/67 | HR 79 | Temp 97.9°F | Resp 16 | Ht 61.0 in | Wt 114.0 lb

## 2020-06-22 VITALS — BP 136/67 | HR 79 | Temp 96.9°F | Wt 112.0 lb

## 2020-06-22 DIAGNOSIS — C8339 Diffuse large B-cell lymphoma, extranodal and solid organ sites: Secondary | ICD-10-CM

## 2020-06-22 DIAGNOSIS — Z1231 Encounter for screening mammogram for malignant neoplasm of breast: Secondary | ICD-10-CM

## 2020-06-22 DIAGNOSIS — C8599 Non-Hodgkin lymphoma, unspecified, extranodal and solid organ sites: Secondary | ICD-10-CM | POA: Diagnosis not present

## 2020-06-22 DIAGNOSIS — C8330 Diffuse large B-cell lymphoma, unspecified site: Secondary | ICD-10-CM | POA: Insufficient documentation

## 2020-06-22 DIAGNOSIS — Z923 Personal history of irradiation: Secondary | ICD-10-CM | POA: Diagnosis not present

## 2020-06-22 DIAGNOSIS — Z9221 Personal history of antineoplastic chemotherapy: Secondary | ICD-10-CM | POA: Insufficient documentation

## 2020-06-22 DIAGNOSIS — F0390 Unspecified dementia without behavioral disturbance: Secondary | ICD-10-CM | POA: Insufficient documentation

## 2020-06-22 DIAGNOSIS — Z79899 Other long term (current) drug therapy: Secondary | ICD-10-CM | POA: Diagnosis not present

## 2020-06-22 LAB — COMPREHENSIVE METABOLIC PANEL
ALT: 15 U/L (ref 0–44)
AST: 24 U/L (ref 15–41)
Albumin: 4.7 g/dL (ref 3.5–5.0)
Alkaline Phosphatase: 115 U/L (ref 38–126)
Anion gap: 9 (ref 5–15)
BUN: 24 mg/dL — ABNORMAL HIGH (ref 8–23)
CO2: 28 mmol/L (ref 22–32)
Calcium: 9.7 mg/dL (ref 8.9–10.3)
Chloride: 99 mmol/L (ref 98–111)
Creatinine, Ser: 0.93 mg/dL (ref 0.44–1.00)
GFR calc Af Amer: >60 mL/min (ref >60)
GFR calc non Af Amer: 55 mL/min — ABNORMAL LOW (ref >60)
Glucose, Bld: 111 mg/dL — ABNORMAL HIGH (ref 70–99)
Potassium: 4.1 mmol/L (ref 3.5–5.1)
Sodium: 136 mmol/L (ref 135–145)
Total Bilirubin: 0.7 mg/dL (ref 0.3–1.2)
Total Protein: 7.5 g/dL (ref 6.5–8.1)

## 2020-06-22 LAB — CBC WITH DIFFERENTIAL/PLATELET
Abs Immature Granulocytes: 0.03 10*3/uL (ref 0.00–0.07)
Basophils Absolute: 0 10*3/uL (ref 0.0–0.1)
Basophils Relative: 0 %
Eosinophils Absolute: 0.1 10*3/uL (ref 0.0–0.5)
Eosinophils Relative: 2 %
HCT: 40.4 % (ref 36.0–46.0)
Hemoglobin: 13.7 g/dL (ref 12.0–15.0)
Immature Granulocytes: 1 %
Lymphocytes Relative: 22 %
Lymphs Abs: 1.5 10*3/uL (ref 0.7–4.0)
MCH: 30 pg (ref 26.0–34.0)
MCHC: 33.9 g/dL (ref 30.0–36.0)
MCV: 88.6 fL (ref 80.0–100.0)
Monocytes Absolute: 0.4 10*3/uL (ref 0.1–1.0)
Monocytes Relative: 6 %
Neutro Abs: 4.6 10*3/uL (ref 1.7–7.7)
Neutrophils Relative %: 69 %
Platelets: 280 10*3/uL (ref 150–400)
RBC: 4.56 MIL/uL (ref 3.87–5.11)
RDW: 13.4 % (ref 11.5–15.5)
WBC: 6.7 10*3/uL (ref 4.0–10.5)
nRBC: 0 % (ref 0.0–0.2)

## 2020-06-22 LAB — LACTATE DEHYDROGENASE: LDH: 195 U/L — ABNORMAL HIGH (ref 98–192)

## 2020-06-22 MED ORDER — SODIUM CHLORIDE 0.9% FLUSH
10.0000 mL | INTRAVENOUS | Status: DC | PRN
  Administered 2020-06-22: 10 mL via INTRAVENOUS
  Filled 2020-06-22: qty 10

## 2020-06-22 MED ORDER — HEPARIN SOD (PORK) LOCK FLUSH 100 UNIT/ML IV SOLN
500.0000 [IU] | Freq: Once | INTRAVENOUS | Status: AC
  Administered 2020-06-22: 500 [IU] via INTRAVENOUS
  Filled 2020-06-22: qty 5

## 2020-06-22 NOTE — Addendum Note (Signed)
Addended by: Delice Bison E on: 06/22/2020 01:30 PM   Modules accepted: Orders

## 2020-06-22 NOTE — Assessment & Plan Note (Addendum)
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 Stage I E. S/p 4- R-mini CHOP; s/p consolidation radiation [finished Feb 19th 2021]. AUG 2021- PET scan-complete response noted in the left breast; slight uptake noted in the mediastinal lymph nodes/clinically reactive [see below].  We will follow clinically without any imaging.  We will get mammograms.  #PET scan August 2021 mediastinal lymph nodes/paratracheal mild uptake-question reactive versus progressive malignancy [clinically less likely].  Clinically monitor without any imaging.  # Dementia: Stable continues to be fairly independent.  # port/IV access-stable.  Port flush today.  Discussed explantation; discussed pro and cons- pt's preference to keep it.  # COVID/flu shot- thru [facility].   I spoke at length with the patient's daughter- regarding the patient's clinical status/plan of care.  Family agreement.   # DISPOSITION: # port flush in 3 months- # Follow up in 6 months  MD;labs-cbc/cmp/LDH;port flush; bil mammogram prior Dr.B.  Cc; Dr.Baboff  # I reviewed the blood work- with the patient in detail; also reviewed the imaging independently [as summarized above]; and with the patient in detail.

## 2020-06-22 NOTE — Progress Notes (Signed)
Radiation Oncology Follow up Note  Name: Stephanie Davidson   Date:   06/22/2020 MRN:  [id number] DOB: 1931-04-18    This 86 y.o. 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  REFERRING PROVIDER: Derinda Late, MD  HPI: Patient is an 86 year old female now about 6 months having completed whole breast radiation to her left breast for involvement of diffuse large B-cell lymphoma.  Seen today in routine follow-up she is doing well.  She specifically denies breast tenderness cough or bone pain.Marland Kitchen  PET scan performed in August which I have reviewed shows complete response.  Has some slight mediastinal lymph node uptake not clinically significant.  COMPLICATIONS OF TREATMENT: none  FOLLOW UP COMPLIANCE: keeps appointments   PHYSICAL EXAM:  BP 136/67   Pulse 79   Temp (!) 96.9 F (36.1 C) (Tympanic)   Wt 112 lb (50.8 kg)   BMI 21.16 kg/m  Lungs are clear to A&P cardiac examination essentially unremarkable with regular rate and rhythm. No dominant mass or nodularity is noted in either breast in 2 positions examined. Incision is well-healed. No axillary or supraclavicular adenopathy is appreciated. Cosmetic result is excellent.  Well-developed well-nourished patient in NAD. HEENT reveals PERLA, EOMI, discs not visualized.  Oral cavity is clear. No oral mucosal lesions are identified. Neck is clear without evidence of cervical or supraclavicular adenopathy. Lungs are clear to A&P. Cardiac examination is essentially unremarkable with regular rate and rhythm without murmur rub or thrill. Abdomen is benign with no organomegaly or masses noted. Motor sensory and DTR levels are equal and symmetric in the upper and lower extremities. Cranial nerves II through XII are grossly intact. Proprioception is intact. No peripheral adenopathy or edema is identified. No motor or sensory levels are noted. Crude  visual fields are within normal range.  RADIOLOGY RESULTS: PET CT scan reviewed compatible with above-stated findings  PLAN: Present time patient has had a complete response she continues close follow-up care with medical oncology for her lymphoma.  I have suggested possibility of mammograms which have not been performed in quite a while.  Otherwise have asked them to copy me any further PET scans.  I have asked to see her back in 6 months for follow-up.  Daughter knows to call with any concerns at any time.  I would like to take this opportunity to thank you for allowing me to participate in the care of your patient.Noreene Filbert, MD

## 2020-06-22 NOTE — Progress Notes (Signed)
[name] CONSULT NOTE  Patient Care Team: Derinda Late, MD as PCP - General (Family Medicine)  CHIEF COMPLAINTS/PURPOSE OF CONSULTATION: Breast lymphoma   Oncology History Overview Note  # AUG 2020- LEFT BREAST DLBCL [non-GCB subtype; Double expressor;NEG- CD-30; Ki-67-80-90%]; FISH -negative for translocations; SEP 2020- PET-left breast intense uptake; no distant disease noted.  Hold off bone marrow biopsy [patient/family preference not to be aggressive]; Stage adjusted IPI-"2 points" cure rate 50-60%.   # 9/21- Pred+Rituxan; OCT 2020- mini-R-CHOP; s/p radiation [feb 19th, 2021] -------------------------------------------------------------------------------------------- # 2006- DLBCL of Breast [s? 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    ;GOALS: Cure  CURRENT/MOST RECENT THERAPY:mini-RCHOP    Lymphoma of breast ([place])  05/25/2019 Initial Diagnosis   Lymphoma of breast ([place])   06/14/2019 -  Chemotherapy   The patient had DOXOrubicin (ADRIAMYCIN) chemo injection 36 mg, 25 mg/m2 = 36 mg (100 % of original dose 25 mg/m2), Intravenous,  Once, 4 of 4 cycles Dose modification: 25 mg/m2 (original dose 25 mg/m2, Cycle 2, Reason: Provider Judgment) Administration: 36 mg (07/05/2019), 36 mg (08/16/2019), 36 mg (07/26/2019), 36 mg (09/06/2019) palonosetron (ALOXI) injection 0.25 mg, 0.25 mg, Intravenous,  Once, 4 of 4 cycles Administration: 0.25 mg (07/05/2019), 0.25 mg (08/16/2019), 0.25 mg (07/26/2019), 0.25 mg (09/06/2019) pegfilgrastim-jmdb (FULPHILA) injection 6 mg, 6 mg, Subcutaneous,  Once, 4 of 4 cycles Administration: 6 mg (07/06/2019), 6 mg (08/17/2019), 6 mg (07/27/2019), 6 mg (09/07/2019) vinCRIStine (ONCOVIN) 2 mg in sodium chloride 0.9 % 50 mL chemo infusion, 2 mg, Intravenous,  Once, 4 of 4 cycles Administration: 2 mg  (07/05/2019), 2 mg (08/16/2019), 2 mg (07/26/2019), 2 mg (09/06/2019) riTUXimab (RITUXAN) 500 mg in sodium chloride 0.9 % 250 mL (1.6667 mg/mL) infusion, 375 mg/m2 = 500 mg, Intravenous,  Once, 1 of 1 cycle Administration: 500 mg (06/14/2019) cyclophosphamide (CYTOXAN) 580 mg in sodium chloride 0.9 % 250 mL chemo infusion, 400 mg/m2 = 580 mg (100 % of original dose 400 mg/m2), Intravenous,  Once, 4 of 4 cycles Dose modification: 600 mg/m2 (original dose 400 mg/m2, Cycle 2, Reason: Provider Judgment), 400 mg/m2 (original dose 400 mg/m2, Cycle 2, Reason: Provider Judgment) Administration: 580 mg (07/05/2019), 580 mg (08/16/2019), 580 mg (07/26/2019), 580 mg (09/06/2019) fosaprepitant (EMEND) 150 mg, dexamethasone (DECADRON) 12 mg in sodium chloride 0.9 % 145 mL IVPB, , Intravenous,  Once, 4 of 4 cycles Administration:  (07/05/2019),  (08/16/2019),  (07/26/2019),  (09/06/2019) riTUXimab-pvvr (RUXIENCE) 500 mg in sodium chloride 0.9 % 250 mL (1.6667 mg/mL) infusion, 375 mg/m2 = 500 mg (100 % of original dose 375 mg/m2), Intravenous,  Once, 1 of 1 cycle Dose modification: 375 mg/m2 (original dose 375 mg/m2, Cycle 2, Reason: Other (see comments), Comment: insurance)  for chemotherapy treatment.       HISTORY OF PRESENTING ILLNESS:  Stephanie Davidson 86 y.o.  female diffuse large B-cell lymphoma of the left breast stage I E/recurrent currently on mini R-CHOP-is here for follow-up/review results of the PET scan.  Denies any new lumps or bumps.  Denies any nausea vomiting.  No headaches.  No shortness of breath or cough.  No night sweats.  Review of Systems  Constitutional: Negative for chills, diaphoresis, fever and malaise/fatigue.  HENT: Negative for nosebleeds and sore throat.   Eyes: Negative for double vision.  Respiratory: Negative for cough, hemoptysis, sputum production, shortness of breath  and wheezing.   Cardiovascular: Negative for chest pain, palpitations, orthopnea and leg swelling.   Gastrointestinal: Negative for abdominal pain, blood in stool, constipation, diarrhea, heartburn, melena, nausea and vomiting.  Genitourinary: Negative for dysuria, frequency and urgency.  Musculoskeletal: Positive for back pain and joint pain.  Skin: Negative.  Negative for itching and rash.  Neurological: Negative for dizziness, tingling, focal weakness, weakness and headaches.  Endo/Heme/Allergies: Does not bruise/bleed easily.  Psychiatric/Behavioral: Positive for memory loss. Negative for depression. The patient is not nervous/anxious and does not have insomnia.      MEDICAL HISTORY:  Past Medical History:  Diagnosis Date  . Breast cancer ([place])   . Hypertension   . Personal history of chemotherapy   . Personal history of radiation therapy     SURGICAL HISTORY: Past Surgical History:  Procedure Laterality Date  . BREAST BIOPSY Right ?   needle bx-benign  . BREAST BIOPSY Left 05/12/2019   [place] bx 12:00, ven[place] marker, path pending  . BREAST EXCISIONAL BIOPSY Left 2003   lymphoma  . BREAST EXCISIONAL BIOPSY Left ?   lump was removed, benign  . IR IMAGING GUIDED PORT INSERTION  06/02/2019    SOCIAL HISTORY: Social History   Socioeconomic History  . Marital status: Widowed    Spouse name: Not on file  . Number of children: 2  . Years of education: Not on file  . Highest education level: Not on file  Occupational History  . Not on file  Tobacco Use  . Smoking status: Never Smoker  . Smokeless tobacco: Never Used  Vaping Use  . Vaping Use: Never used  Substance and Sexual Activity  . Alcohol use: No  . Drug use: Not on file  . Sexual activity: Not on file  Other Topics Concern  . Not on file  Social History Narrative   [facility]; own apartment; no smoking; no alcohol. 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    Social Determinants of Health   Financial Resource Strain:   . Difficulty of Paying Living Expenses: Not on file  Food Insecurity:   . Worried About Charity fundraiser in the  Last Year: Not on file  . Ran Out of Food in the Last Year: Not on file  Transportation Needs:   . Lack of Transportation (Medical): Not on file  . Lack of Transportation (Non-Medical): Not on file  Physical Activity:   . Days of Exercise per Week: Not on file  . Minutes of Exercise per Session: Not on file  Stress:   . Feeling of Stress : Not on file  Social Connections:   . Frequency of Communication with Friends and Family: Not on file  . Frequency of Social Gatherings with Friends and Family: Not on file  . Attends Religious Services: Not on file  . Active Member of Clubs or Organizations: Not on file  . Attends Archivist Meetings: Not on file  . Marital Status: Not on file  Intimate Partner Violence:   . Fear of Current or Ex-Partner: Not on file  . Emotionally Abused: Not on file  . Physically Abused: Not on file  . Sexually Abused: Not on file    FAMILY HISTORY: Family History  Problem Relation Age of Onset  . Breast cancer Neg Hx     ALLERGIES:  has No Known Allergies.  MEDICATIONS:  Current Outpatient Medications  Medication Sig Dispense Refill  . amLODipine (NORVASC) 10 MG tablet Take 1 tablet by mouth daily.    Marland Kitchen  donepezil (ARICEPT) 10 MG tablet Take 1 tablet by mouth at bedtime.    . fluticasone (FLONASE) 50 MCG/ACT nasal spray Place 2 sprays into both nostrils daily.     . hydrochlorothiazide (HYDRODIURIL) 25 MG tablet Take 1 tablet by mouth daily.    . irbesartan (AVAPRO) 300 MG tablet Take 1 tablet by mouth daily.    Marland Kitchen lidocaine-prilocaine (EMLA) cream Apply to affected area once 30 g 3  . meloxicam (MOBIC) 15 MG tablet Take 1 tablet by mouth as needed.     . Multiple Vitamin (MULTI-VITAMIN) tablet Take 1 tablet by mouth daily.     No current facility-administered medications for this visit.   Facility-Administered Medications Ordered in Other Visits  Medication Dose Route Frequency Provider Last Rate Last Admin  . heparin lock flush 100  unit/mL  500 Units Intravenous Once Faythe Casa E, NP      . sodium chloride flush (NS) 0.9 % injection 10 mL  10 mL Intravenous PRN Faythe Casa E, NP      . sodium chloride flush (NS) 0.9 % injection 10 mL  10 mL Intravenous PRN Cammie Sickle, MD   10 mL at 06/22/20 1112      .  PHYSICAL EXAMINATION: ECOG PERFORMANCE STATUS: 1 - Symptomatic but completely ambulatory  Vitals:   06/22/20 1109  BP: 136/67  Pulse: 79  Resp: 16  Temp: 97.9 F (36.6 C)  SpO2: 98%   Filed Weights   06/22/20 1109  Weight: 114 lb (51.7 kg)    Physical Exam Constitutional:      Comments: Accompanied by her daughter.  She is walking herself.  HENT:     Head: Normocephalic and atraumatic.     Mouth/Throat:     Pharynx: No oropharyngeal exudate.  Eyes:     Pupils: Pupils are equal, round, and reactive to light.  Cardiovascular:     Rate and Rhythm: Normal rate and regular rhythm.  Pulmonary:     Effort: Pulmonary effort is normal. No respiratory distress.     Breath sounds: Normal breath sounds. No wheezing.  Abdominal:     General: Bowel sounds are normal. There is no distension.     Palpations: Abdomen is soft. There is no mass.     Tenderness: There is no abdominal tenderness. There is no guarding or rebound.  Musculoskeletal:        General: No tenderness. Normal range of motion.     Cervical back: Normal range of motion and neck supple.  Skin:    General: Skin is warm.     Comments: Left breast upper inner quadrant- ~1-2cm ( improved from basleine-5.5 x 4.5 cm).   Neurological:     Mental Status: She is alert and oriented to person, place, and time.  Psychiatric:        Mood and Affect: Affect normal.      LABORATORY DATA:  I have reviewed the data as listed Lab Results  Component Value Date   WBC 6.7 06/22/2020   HGB 13.7 06/22/2020   HCT 40.4 06/22/2020   MCV 88.6 06/22/2020   PLT 280 06/22/2020   Recent Labs    12/02/19 1014 02/17/20 1022  06/22/20 1108  NA 135 135 136  K 4.2 4.1 4.1  CL 100 101 99  CO2 '26 25 28  ' GLUCOSE 100* 96 111*  BUN 22 21 24*  CREATININE 0.57 0.65 0.93  CALCIUM 9.8 9.3 9.7  GFRNONAA >60 >60 55*  GFRAA >60 >60 >  60  PROT 6.7 6.7 7.5  ALBUMIN 4.2 4.2 4.7  AST '23 20 24  ' ALT '17 16 15  ' ALKPHOS 114 112 115  BILITOT 0.6 0.8 0.7    RADIOGRAPHIC STUDIES: I have personally reviewed the radiological images as listed and agreed with the findings in the report. No results found.  ASSESSMENT & PLAN:   Lymphoma of breast ([place]) # Diffuse large B-cell lymphoma left breast Non-GCB subtype; double expresser. Stage I E. S/p 4- R-mini CHOP; s/p consolidation radiation [finished Feb 19th 2021]. AUG 2021- PET scan-complete response noted in the left breast; slight uptake noted in the mediastinal lymph nodes/clinically reactive [see below].  We will follow clinically without any imaging.  We will get mammograms.  #PET scan August 2021 mediastinal lymph nodes/paratracheal mild uptake-question reactive versus progressive malignancy [clinically less likely].  Clinically monitor without any imaging.  # Dementia: Stable continues to be fairly independent.  # port/IV access-stable.  Port flush today.  Discussed explantation; discussed pro and cons- pt's preference to keep it.  # COVID/flu shot- thru [facility].   I spoke at length with the patient's daughter- regarding the patient's clinical status/plan of care.  Family agreement.   # DISPOSITION: # port flush in 3 months- # Follow up in 6 months  MD;labs-cbc/cmp/LDH;port flush; bil mammogram prior Dr.B.  Cc; Dr.Baboff  # I reviewed the blood work- with the patient in detail; also reviewed the imaging independently [as summarized above]; and with the patient in detail.     All questions were answered. The patient knows to call the clinic with any problems, questions or concerns.    Cammie Sickle, MD 06/22/2020 12:35 PM

## 2020-06-28 ENCOUNTER — Other Ambulatory Visit: Payer: Self-pay

## 2020-06-28 ENCOUNTER — Inpatient Hospital Stay: Payer: Medicare Other | Attending: Hospice and Palliative Medicine | Admitting: Hospice and Palliative Medicine

## 2020-06-28 DIAGNOSIS — Z515 Encounter for palliative care: Secondary | ICD-10-CM

## 2020-06-28 DIAGNOSIS — C8599 Non-Hodgkin lymphoma, unspecified, extranodal and solid organ sites: Secondary | ICD-10-CM

## 2020-06-28 NOTE — Progress Notes (Signed)
Virtual Visit via Telephone Note  I connected with Stephanie Davidson on 06/28/20 at 11:00 AM EDT by telephone and verified that I am speaking with the correct person using two identifiers.   I discussed the limitations, risks, security and privacy concerns of performing an evaluation and management service by telephone and the availability of in person appointments. I also discussed with the patient that there may be a patient responsible charge related to this service. The patient expressed understanding and agreed to proceed.   History of Present Illness: [name] is an 86 y.o. 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  Patient had severe symptoms from previous treatment and was initially reluctant to pursue further cancer treatment.  She agreed with mini-RCHOP. Patient was referred to palliative care to help address goals and provide with support.   Observations/Objective: I called and spoke with patient by phone.  Patient reports she is doing well.  She denies any significant changes or concerns.  No symptomatic complaints at present.  No issues with medications nor need for refills.  Next scheduled follow-up in the cancer center in March.  Assessment and Plan: Lymphoma -status post mini R-CHOP and RT.  Followed by Dr. Rogue Bussing.  Seems to be doing well overall.    Follow Up Instructions: Follow-up MyChart visit 3 months   I discussed the assessment and treatment plan with the patient. The patient was provided an opportunity to ask questions and all were answered. The patient agreed with the plan and demonstrated an understanding of the instructions.   The patient was advised to call back or seek an in-person evaluation if the symptoms worsen or if the condition fails to improve as anticipated.  I provided 5 minutes of non-face-to-face time during this  encounter.   Irean Hong, NP

## 2020-07-19 ENCOUNTER — Ambulatory Visit
Admission: RE | Admit: 2020-07-19 | Discharge: 2020-07-19 | Disposition: A | Discharge status: Home / Self Care | Payer: Medicare Other | Source: Ambulatory Visit | Attending: Internal Medicine | Admitting: Internal Medicine

## 2020-07-19 ENCOUNTER — Other Ambulatory Visit: Payer: Self-pay

## 2020-07-19 DIAGNOSIS — Z1231 Encounter for screening mammogram for malignant neoplasm of breast: Secondary | ICD-10-CM | POA: Insufficient documentation

## 2020-07-19 DIAGNOSIS — C8599 Non-Hodgkin lymphoma, unspecified, extranodal and solid organ sites: Secondary | ICD-10-CM

## 2020-09-21 ENCOUNTER — Inpatient Hospital Stay: Payer: Medicare Other | Attending: Internal Medicine

## 2020-09-21 ENCOUNTER — Other Ambulatory Visit: Payer: Self-pay

## 2020-09-21 DIAGNOSIS — Z452 Encounter for adjustment and management of vascular access device: Secondary | ICD-10-CM | POA: Diagnosis not present

## 2020-09-21 DIAGNOSIS — Z95828 Presence of other vascular implants and grafts: Secondary | ICD-10-CM

## 2020-09-21 DIAGNOSIS — Z8572 Personal history of non-Hodgkin lymphomas: Secondary | ICD-10-CM | POA: Insufficient documentation

## 2020-09-21 MED ORDER — HEPARIN SOD (PORK) LOCK FLUSH 100 UNIT/ML IV SOLN
INTRAVENOUS | Status: AC
  Filled 2020-09-21: qty 5

## 2020-09-21 MED ORDER — SODIUM CHLORIDE 0.9% FLUSH
10.0000 mL | Freq: Once | INTRAVENOUS | Status: AC
  Administered 2020-09-21: 10 mL via INTRAVENOUS
  Filled 2020-09-21: qty 10

## 2020-09-21 MED ORDER — HEPARIN SOD (PORK) LOCK FLUSH 100 UNIT/ML IV SOLN
500.0000 [IU] | Freq: Once | INTRAVENOUS | Status: AC
  Administered 2020-09-21: 500 [IU] via INTRAVENOUS
  Filled 2020-09-21: qty 5

## 2020-09-27 ENCOUNTER — Inpatient Hospital Stay: Payer: Medicare Other | Attending: Hospice and Palliative Medicine | Admitting: Hospice and Palliative Medicine

## 2020-09-27 DIAGNOSIS — Z515 Encounter for palliative care: Secondary | ICD-10-CM

## 2020-09-27 DIAGNOSIS — C8599 Non-Hodgkin lymphoma, unspecified, extranodal and solid organ sites: Secondary | ICD-10-CM

## 2020-09-27 NOTE — Progress Notes (Signed)
Virtual Visit via Telephone Note  I connected with Stephanie Davidson on 09/27/20 at 10:30 AM EST by telephone and verified that I am speaking with the correct person using two identifiers.   I discussed the limitations, risks, security and privacy concerns of performing an evaluation and management service by telephone and the availability of in person appointments. I also discussed with the patient that there may be a patient responsible charge related to this service. The patient expressed understanding and agreed to proceed.   History of Present Illness: [name] is an 87 y.o. 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  Patient had severe symptoms from previous treatment and was initially reluctant to pursue further cancer treatment.  She agreed with mini-RCHOP. Patient was referred to palliative care to help address goals and provide with support.   Observations/Objective: I called and spoke with patient by phone.  Patient reports that she is doing well.  She is still walking daily for exercise.  She is eating well with stable weight.  No issues with medications no need for refills today.  Patient does report feeling somewhat fatigued today and had a cough yesterday that subsided after she took an antitussive.  She is fully vaccinated and has her booster.  No fever or chills or other symptomatic complaints.  I suggested that she could consider Covid testing if symptoms persist.  Assessment and Plan: Lymphoma -status post mini R-CHOP and RT.  Followed by Dr. Donneta Romberg.  Seems to be doing well overall.  Plan for follow-up in March  Follow Up Instructions: Follow-up MyChart visit 6 months   I discussed the assessment and treatment plan with the patient. The patient was provided an opportunity to ask questions and all were answered. The patient agreed with the plan  and demonstrated an understanding of the instructions.   The patient was advised to call back or seek an in-person evaluation if the symptoms worsen or if the condition fails to improve as anticipated.  I provided 5 minutes of non-face-to-face time during this encounter.   Malachy Moan, NP

## 2020-11-13 ENCOUNTER — Encounter: Payer: Self-pay | Admitting: Internal Medicine

## 2020-11-13 ENCOUNTER — Other Ambulatory Visit: Payer: Self-pay

## 2020-11-13 DIAGNOSIS — C8599 Non-Hodgkin lymphoma, unspecified, extranodal and solid organ sites: Secondary | ICD-10-CM

## 2020-11-30 ENCOUNTER — Other Ambulatory Visit: Payer: Self-pay

## 2020-11-30 ENCOUNTER — Inpatient Hospital Stay (HOSPITAL_BASED_OUTPATIENT_CLINIC_OR_DEPARTMENT_OTHER): Payer: Medicare Other | Admitting: Internal Medicine

## 2020-11-30 ENCOUNTER — Inpatient Hospital Stay: Payer: Medicare Other | Attending: Internal Medicine

## 2020-11-30 DIAGNOSIS — Z79899 Other long term (current) drug therapy: Secondary | ICD-10-CM | POA: Insufficient documentation

## 2020-11-30 DIAGNOSIS — F0390 Unspecified dementia without behavioral disturbance: Secondary | ICD-10-CM | POA: Diagnosis not present

## 2020-11-30 DIAGNOSIS — Z9221 Personal history of antineoplastic chemotherapy: Secondary | ICD-10-CM | POA: Diagnosis not present

## 2020-11-30 DIAGNOSIS — I10 Essential (primary) hypertension: Secondary | ICD-10-CM | POA: Diagnosis not present

## 2020-11-30 DIAGNOSIS — Z923 Personal history of irradiation: Secondary | ICD-10-CM | POA: Diagnosis not present

## 2020-11-30 DIAGNOSIS — C8330 Diffuse large B-cell lymphoma, unspecified site: Secondary | ICD-10-CM | POA: Diagnosis not present

## 2020-11-30 DIAGNOSIS — C8599 Non-Hodgkin lymphoma, unspecified, extranodal and solid organ sites: Secondary | ICD-10-CM

## 2020-11-30 LAB — CBC WITH DIFFERENTIAL/PLATELET
Abs Immature Granulocytes: 0.03 10*3/uL (ref 0.00–0.07)
Basophils Absolute: 0 10*3/uL (ref 0.0–0.1)
Basophils Relative: 0 %
Eosinophils Absolute: 0.3 10*3/uL (ref 0.0–0.5)
Eosinophils Relative: 5 %
HCT: 38.8 % (ref 36.0–46.0)
Hemoglobin: 12.6 g/dL (ref 12.0–15.0)
Immature Granulocytes: 0 %
Lymphocytes Relative: 20 %
Lymphs Abs: 1.4 10*3/uL (ref 0.7–4.0)
MCH: 30.2 pg (ref 26.0–34.0)
MCHC: 32.5 g/dL (ref 30.0–36.0)
MCV: 93 fL (ref 80.0–100.0)
Monocytes Absolute: 0.6 10*3/uL (ref 0.1–1.0)
Monocytes Relative: 9 %
Neutro Abs: 4.5 10*3/uL (ref 1.7–7.7)
Neutrophils Relative %: 66 %
Platelets: 287 10*3/uL (ref 150–400)
RBC: 4.17 MIL/uL (ref 3.87–5.11)
RDW: 13.2 % (ref 11.5–15.5)
WBC: 6.9 10*3/uL (ref 4.0–10.5)
nRBC: 0 % (ref 0.0–0.2)

## 2020-11-30 LAB — COMPREHENSIVE METABOLIC PANEL
ALT: 14 U/L (ref 0–44)
AST: 20 U/L (ref 15–41)
Albumin: 4.2 g/dL (ref 3.5–5.0)
Alkaline Phosphatase: 116 U/L (ref 38–126)
Anion gap: 7 (ref 5–15)
BUN: 28 mg/dL — ABNORMAL HIGH (ref 8–23)
CO2: 29 mmol/L (ref 22–32)
Calcium: 9.8 mg/dL (ref 8.9–10.3)
Chloride: 101 mmol/L (ref 98–111)
Creatinine, Ser: 0.8 mg/dL (ref 0.44–1.00)
GFR, Estimated: >60 mL/min (ref >60)
Glucose, Bld: 98 mg/dL (ref 70–99)
Potassium: 4.9 mmol/L (ref 3.5–5.1)
Sodium: 137 mmol/L (ref 135–145)
Total Bilirubin: 0.8 mg/dL (ref 0.3–1.2)
Total Protein: 7.1 g/dL (ref 6.5–8.1)

## 2020-11-30 LAB — LACTATE DEHYDROGENASE: LDH: 190 U/L (ref 98–192)

## 2020-11-30 NOTE — Assessment & Plan Note (Addendum)
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 Stage I E. S/p 4- R-mini CHOP; s/p consolidation radiation [finished Feb 19th 2021]. AUG 2021- PET scan-complete response noted in the left breast; slight uptake noted in the mediastinal lymph nodes/clinically reactive [see below].  Clinically stable-see below  #Left breast tenderness-no obvious masses noted on exam.  Question scar tissue.  Recommend diagnostic mammogram/ultrasound ASAP.  #PET scan August 2021 mediastinal lymph nodes/paratracheal mild uptake-question reactive versus progressive malignancy [clinically less likely].  Clinically stable.  Monitor for now; await above work-up.  # Dementia: Stable continues to be fairly independent.  # port/IV access-stable.  Port flush today.  Discussed explantation; discussed pro and cons- pt's preference to keep it.  # DISPOSITION: will call Kim- [phone] # Left diagnostic mammo/US ASAP # follow up TBD- Dr.B  Cc; [email or web address]

## 2020-11-30 NOTE — Progress Notes (Signed)
[place] CONSULT NOTE  Patient Care Team: Derinda Late, MD as PCP - General (Family Medicine)  CHIEF COMPLAINTS/PURPOSE OF CONSULTATION: Breast lymphoma   Oncology History Overview Note  # AUG 2020- LEFT BREAST DLBCL [non-GCB subtype; Double expressor;NEG- CD-30; Ki-67-80-90%]; FISH -negative for translocations; SEP 2020- PET-left breast intense uptake; no distant disease noted.  Hold off bone marrow biopsy [patient/family preference not to be aggressive]; Stage adjusted IPI-"2 points" cure rate 50-60%.   # 9/21- Pred+Rituxan; OCT 2020- mini-R-CHOP; s/p radiation [feb 19th, 2021] -------------------------------------------------------------------------------------------- # 2006- DLBCL of Breast [s? 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    ;GOALS: Cure  CURRENT/MOST RECENT THERAPY:mini-RCHOP    Lymphoma of breast ([place])  05/25/2019 Initial Diagnosis   Lymphoma of breast ([place])   06/14/2019 -  Chemotherapy   The patient had DOXOrubicin (ADRIAMYCIN) chemo injection 36 mg, 25 mg/m2 = 36 mg (100 % of original dose 25 mg/m2), Intravenous,  Once, 4 of 4 cycles Dose modification: 25 mg/m2 (original dose 25 mg/m2, Cycle 2, Reason: Adelfa Lozito Judgment) Administration: 36 mg (07/05/2019), 36 mg (08/16/2019), 36 mg (07/26/2019), 36 mg (09/06/2019) palonosetron (ALOXI) injection 0.25 mg, 0.25 mg, Intravenous,  Once, 4 of 4 cycles Administration: 0.25 mg (07/05/2019), 0.25 mg (08/16/2019), 0.25 mg (07/26/2019), 0.25 mg (09/06/2019) pegfilgrastim-jmdb (FULPHILA) injection 6 mg, 6 mg, Subcutaneous,  Once, 4 of 4 cycles Administration: 6 mg (07/06/2019), 6 mg (08/17/2019), 6 mg (07/27/2019), 6 mg (09/07/2019) vinCRIStine (ONCOVIN) 2 mg in sodium chloride 0.9 % 50 mL chemo infusion, 2 mg, Intravenous,  Once, 4 of 4 cycles Administration: 2 mg  (07/05/2019), 2 mg (08/16/2019), 2 mg (07/26/2019), 2 mg (09/06/2019) riTUXimab (RITUXAN) 500 mg in sodium chloride 0.9 % 250 mL (1.6667 mg/mL) infusion, 375 mg/m2 = 500 mg, Intravenous,  Once, 1 of 1 cycle Administration: 500 mg (06/14/2019) cyclophosphamide (CYTOXAN) 580 mg in sodium chloride 0.9 % 250 mL chemo infusion, 400 mg/m2 = 580 mg (100 % of original dose 400 mg/m2), Intravenous,  Once, 4 of 4 cycles Dose modification: 600 mg/m2 (original dose 400 mg/m2, Cycle 2, Reason: Shaquira Moroz Judgment), 400 mg/m2 (original dose 400 mg/m2, Cycle 2, Reason: Narda Fundora Judgment) Administration: 580 mg (07/05/2019), 580 mg (08/16/2019), 580 mg (07/26/2019), 580 mg (09/06/2019) fosaprepitant (EMEND) 150 mg, dexamethasone (DECADRON) 12 mg in sodium chloride 0.9 % 145 mL IVPB, , Intravenous,  Once, 4 of 4 cycles Administration:  (07/05/2019),  (08/16/2019),  (07/26/2019),  (09/06/2019) riTUXimab-pvvr (RUXIENCE) 500 mg in sodium chloride 0.9 % 250 mL (1.6667 mg/mL) infusion, 375 mg/m2 = 500 mg (100 % of original dose 375 mg/m2), Intravenous,  Once, 1 of 1 cycle Dose modification: 375 mg/m2 (original dose 375 mg/m2, Cycle 2, Reason: Other (see comments), Comment: insurance)  for chemotherapy treatment.       HISTORY OF PRESENTING ILLNESS: Patient is accompanied by her son. HUGH GARROW 87 y.o.  female diffuse large B-cell lymphoma of the left breast stage I E/recurrent currently on mini R-CHOP-is here for follow-up.  Patient complains of left breast discomfort intermittently.  She also complains of itching.  Denies any trauma.  Denies any skin rash.  No nausea no vomiting pain no headaches.  No shortness of breath.  No night sweats.  Review of Systems  Constitutional: Negative for chills, diaphoresis, fever and malaise/fatigue.  HENT: Negative for nosebleeds and sore throat.   Eyes: Negative  for double vision.  Respiratory: Negative for cough, hemoptysis, sputum production, shortness of breath and wheezing.    Cardiovascular: Negative for chest pain, palpitations, orthopnea and leg swelling.  Gastrointestinal: Negative for abdominal pain, blood in stool, constipation, diarrhea, heartburn, melena, nausea and vomiting.  Genitourinary: Negative for dysuria, frequency and urgency.  Musculoskeletal: Positive for back pain and joint pain.  Skin: Negative.  Negative for itching and rash.  Neurological: Negative for dizziness, tingling, focal weakness, weakness and headaches.  Endo/Heme/Allergies: Does not bruise/bleed easily.  Psychiatric/Behavioral: Positive for memory loss. Negative for depression. The patient is not nervous/anxious and does not have insomnia.      MEDICAL HISTORY:  Past Medical History:  Diagnosis Date  . Breast cancer ([place])   . Hypertension   . Personal history of chemotherapy   . Personal history of radiation therapy     SURGICAL HISTORY: Past Surgical History:  Procedure Laterality Date  . BREAST BIOPSY Right ?   needle bx-benign  . BREAST BIOPSY Left 05/12/2019   [place] bx 12:00, venus marker, DIFFUSE LARGE B CELL LYMPHOMA  . BREAST EXCISIONAL BIOPSY Left 2003   lymphoma  . BREAST EXCISIONAL BIOPSY Left ?   lump was removed, benign  . IR IMAGING GUIDED PORT INSERTION  06/02/2019    SOCIAL HISTORY: Social History   Socioeconomic History  . Marital status: Widowed    Spouse name: Not on file  . Number of children: 2  . Years of education: Not on file  . Highest education level: Not on file  Occupational History  . Not on file  Tobacco Use  . Smoking status: Never Smoker  . Smokeless tobacco: Never Used  Vaping Use  . Vaping Use: Never used  Substance and Sexual Activity  . Alcohol use: No  . Drug use: Not on file  . Sexual activity: Not on file  Other Topics Concern  . Not on file  Social History Narrative   [facility]; own apartment; no smoking; no alcohol. 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    Social Determinants of Health   Financial Resource Strain: Not on file   Food Insecurity: Not on file  Transportation Needs: Not on file  Physical Activity: Not on file  Stress: Not on file  Social Connections: Not on file  Intimate Partner Violence: Not on file    FAMILY HISTORY: Family History  Problem Relation Age of Onset  . Breast cancer Neg Hx     ALLERGIES:  is allergic to latex.  MEDICATIONS:  Current Outpatient Medications  Medication Sig Dispense Refill  . amLODipine (NORVASC) 10 MG tablet Take 1 tablet by mouth daily.    Marland Kitchen donepezil (ARICEPT) 10 MG tablet Take 1 tablet by mouth at bedtime.    . fluticasone (FLONASE) 50 MCG/ACT nasal spray Place 2 sprays into both nostrils daily.     . hydrochlorothiazide (HYDRODIURIL) 25 MG tablet Take 1 tablet by mouth daily.    . irbesartan (AVAPRO) 300 MG tablet Take 1 tablet by mouth daily.    Marland Kitchen lidocaine-prilocaine (EMLA) cream Apply to affected area once 30 g 3  . meloxicam (MOBIC) 15 MG tablet Take 1 tablet by mouth as needed.     . Multiple Vitamin (MULTI-VITAMIN) tablet Take 1 tablet by mouth daily.     No current facility-administered medications for this visit.   Facility-Administered Medications Ordered in Other Visits  Medication Dose Route Frequency Genieve Ramaswamy Last Rate Last Admin  . heparin lock flush 100 unit/mL  500 Units Intravenous Once  Jacquelin Hawking, NP      . sodium chloride flush (NS) 0.9 % injection 10 mL  10 mL Intravenous PRN Jacquelin Hawking, NP          .  PHYSICAL EXAMINATION: ECOG PERFORMANCE STATUS: 1 - Symptomatic but completely ambulatory  Vitals:   11/30/20 0952  BP: (!) 130/59  Pulse: 82  Temp: 98.6 F (37 C)  SpO2: 100%   Filed Weights   11/30/20 0952  Weight: 114 lb 6.4 oz (51.9 kg)    Physical Exam Constitutional:      Comments: Accompanied by her daughter.  She is walking herself.  HENT:     Head: Normocephalic and atraumatic.     Mouth/Throat:     Pharynx: No oropharyngeal exudate.  Eyes:     Pupils: Pupils are equal, round, and reactive  to light.  Cardiovascular:     Rate and Rhythm: Normal rate and regular rhythm.  Pulmonary:     Effort: Pulmonary effort is normal. No respiratory distress.     Breath sounds: Normal breath sounds. No wheezing.  Abdominal:     General: Bowel sounds are normal. There is no distension.     Palpations: Abdomen is soft. There is no mass.     Tenderness: There is no abdominal tenderness. There is no guarding or rebound.  Musculoskeletal:        General: No tenderness. Normal range of motion.     Cervical back: Normal range of motion and neck supple.  Skin:    General: Skin is warm.     Comments: Left breast upper inner quadrant-no obvious masses noted.  Question scar tissue.  Tenderness noted no skin changes noted.  No nipple changes noted.  Neurological:     Mental Status: She is alert and oriented to person, place, and time.  Psychiatric:        Mood and Affect: Affect normal.      LABORATORY DATA:  I have reviewed the data as listed Lab Results  Component Value Date   WBC 6.9 11/30/2020   HGB 12.6 11/30/2020   HCT 38.8 11/30/2020   MCV 93.0 11/30/2020   PLT 287 11/30/2020   Recent Labs    02/17/20 1022 06/22/20 1108 11/30/20 0930  NA 135 136 137  K 4.1 4.1 4.9  CL 101 99 101  CO2 _0 GLUCOSE 96 111* 98  BUN 21 24* 28*  CREATININE 0.65 0.93 0.80  CALCIUM 9.3 9.7 9.8  GFRNONAA >60 55* >60  GFRAA >60 >60  --   PROT 6.7 7.5 7.1  ALBUMIN 4.2 4.7 4.2  AST _1 ALT _2 ALKPHOS 112 115 116  BILITOT 0.8 0.7 0.8    RADIOGRAPHIC STUDIES: I have personally reviewed the radiological images as listed and agreed with the findings in the report. No results found.  ASSESSMENT & PLAN:   Lymphoma of breast ([place]) # Diffuse large B-cell lymphoma left breast Non-GCB subtype; double expresser. Stage I E. S/p 4- R-mini CHOP; s/p consolidation radiation [finished Feb 19th 2021]. AUG 2021- PET scan-complete response noted in the left breast; slight uptake noted in  the mediastinal lymph nodes/clinically reactive [see below].  Clinically stable-see below  #Left breast tenderness-no obvious masses noted on exam.  Question scar tissue.  Recommend diagnostic mammogram/ultrasound ASAP.  #PET scan August 2021 mediastinal lymph nodes/paratracheal mild uptake-question reactive versus progressive malignancy [clinically less likely].  Clinically stable.  Monitor for now; await above  work-up.  # Dementia: Stable continues to be fairly independent.  # port/IV access-stable.  Port flush today.  Discussed explantation; discussed pro and cons- pt's preference to keep it.  # DISPOSITION: will call Kim- [phone] # Left diagnostic mammo/US ASAP # follow up TBD- Dr.B  Cc; Dr.Baboff    All questions were answered. The patient knows to call the clinic with any problems, questions or concerns.    Cammie Sickle, MD 12/04/2020 12:49 PM

## 2020-12-01 ENCOUNTER — Encounter: Payer: Self-pay | Admitting: Internal Medicine

## 2020-12-07 ENCOUNTER — Other Ambulatory Visit: Payer: Self-pay

## 2020-12-07 ENCOUNTER — Ambulatory Visit
Admission: RE | Admit: 2020-12-07 | Discharge: 2020-12-07 | Disposition: A | Discharge status: Home / Self Care | Payer: Medicare Other | Source: Ambulatory Visit | Attending: Internal Medicine | Admitting: Internal Medicine

## 2020-12-07 DIAGNOSIS — C8599 Non-Hodgkin lymphoma, unspecified, extranodal and solid organ sites: Secondary | ICD-10-CM | POA: Insufficient documentation

## 2020-12-07 DIAGNOSIS — N644 Mastodynia: Secondary | ICD-10-CM | POA: Insufficient documentation

## 2020-12-11 ENCOUNTER — Telehealth: Payer: Self-pay | Admitting: Internal Medicine

## 2020-12-11 NOTE — Telephone Encounter (Signed)
On 3/21-I spoke to patient's son [name] regarding unremarkable mammogram/ultrasound.  No concerns for any active malignancy at this time.  Recommend follow-up in 3 months-MD; labs CBC CMP [facility]-   Thanks, GB

## 2020-12-12 ENCOUNTER — Other Ambulatory Visit: Payer: Self-pay

## 2020-12-12 DIAGNOSIS — C8599 Non-Hodgkin lymphoma, unspecified, extranodal and solid organ sites: Secondary | ICD-10-CM

## 2020-12-12 NOTE — Telephone Encounter (Signed)
Orders added

## 2020-12-20 ENCOUNTER — Ambulatory Visit: Payer: Medicare Other | Admitting: Radiation Oncology

## 2020-12-20 ENCOUNTER — Ambulatory Visit: Payer: Medicare Other | Admitting: Internal Medicine

## 2020-12-20 ENCOUNTER — Other Ambulatory Visit: Payer: Medicare Other

## 2020-12-26 ENCOUNTER — Telehealth: Payer: Self-pay | Admitting: *Deleted

## 2020-12-26 NOTE — Telephone Encounter (Signed)
md received incoming fax from [facility], requesting a cognitive exam form to be completed. Unfortunately, this form needs to be completed by th patient's pcp per Dr. Rogue Bussing. I sent a fax back to [employer] explaining that forms must be completed by pcp not oncologist.

## 2021-02-09 IMAGING — MG US BREAST BX W LOC DEV 1ST LESION IMG BX SPEC US GUIDE*L*
1 series · 8 of 8 positions shown · non-contrast
Comparison: Previous exam(s).
COMPARISON: Previous exam(s).

Addendum:
CLINICAL DATA: 87-year-old female presenting for ultrasound-guided
biopsy of a left breast mass.

EXAM:
ULTRASOUND GUIDED LEFT BREAST CORE NEEDLE BIOPSY

[Series 1: MG view · 0.06mm/px · 8 of 24 slices shown]
[im 1/24]
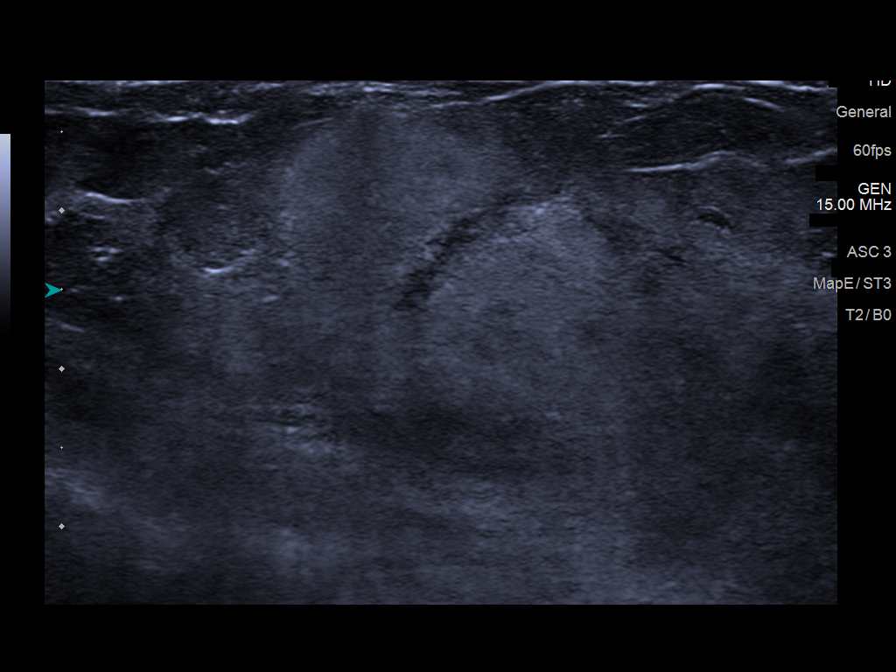
[im 4/24]
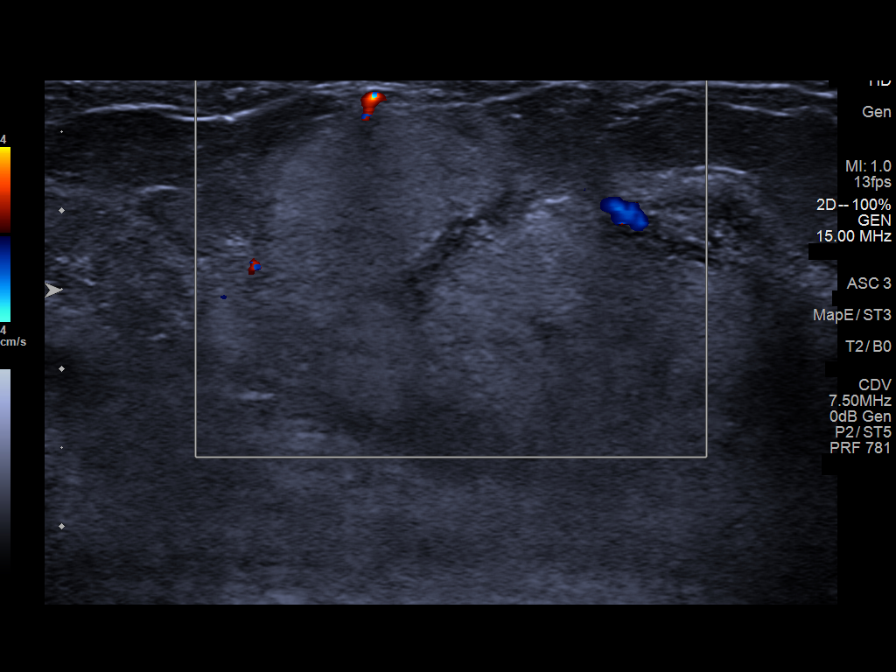
[im 7/24]
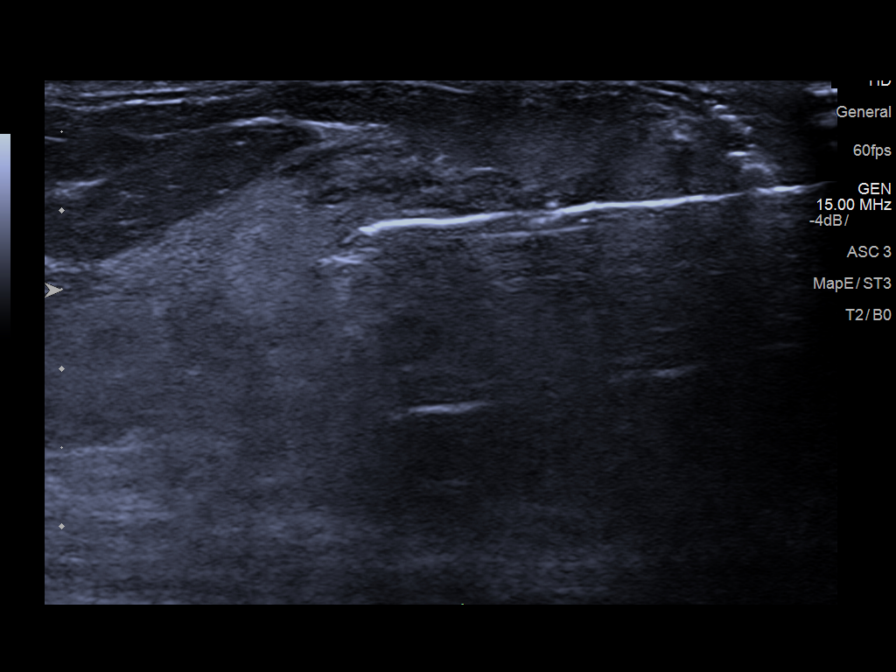
[im 10/24]
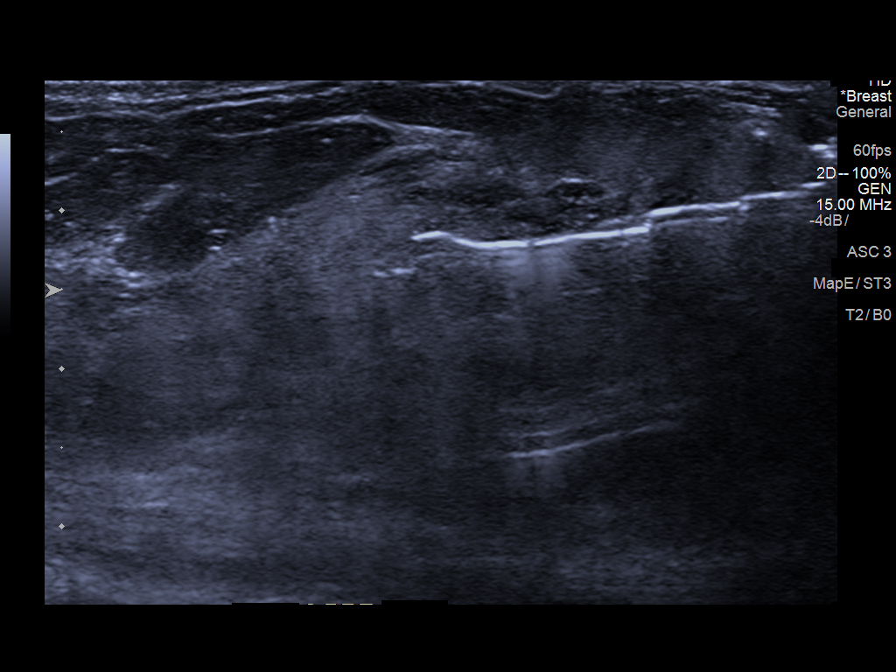
[im 14/24]
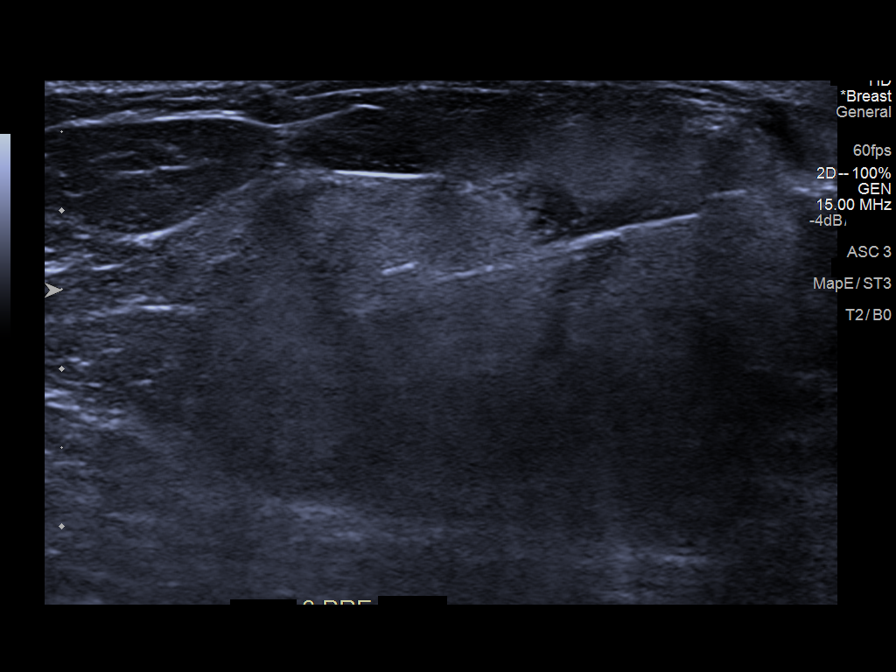
[im 17/24]
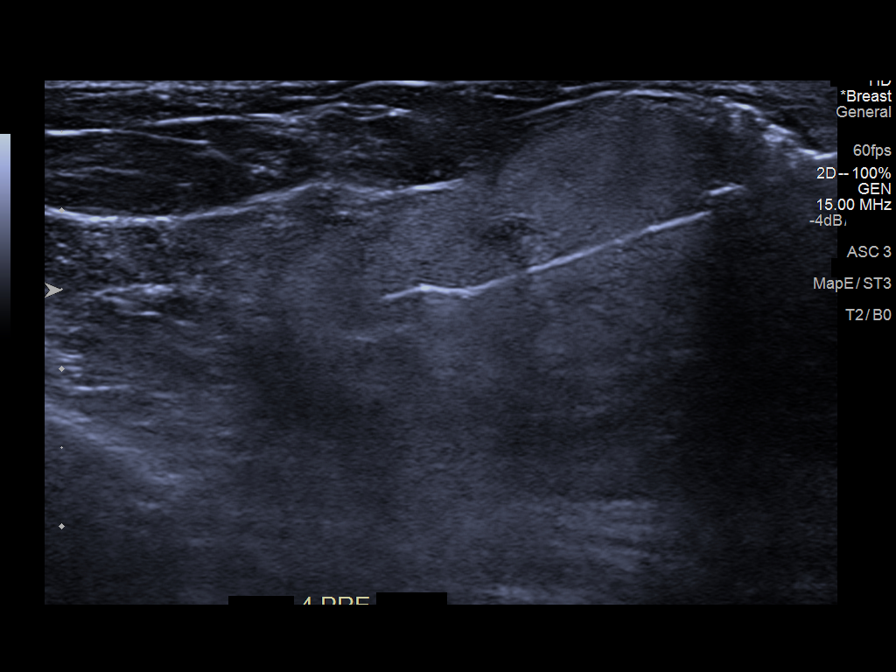
[im 20/24]
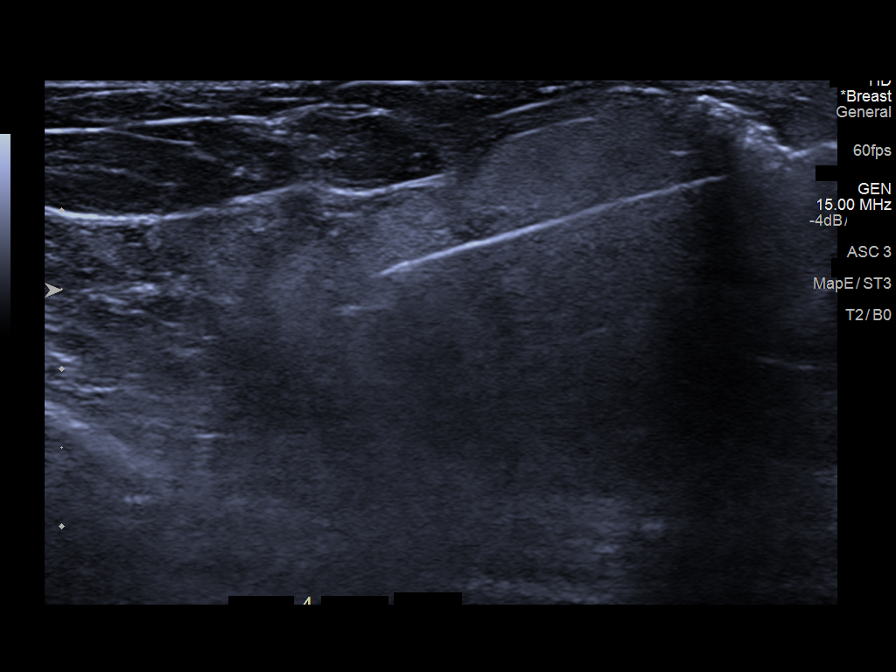
[im 24/24]
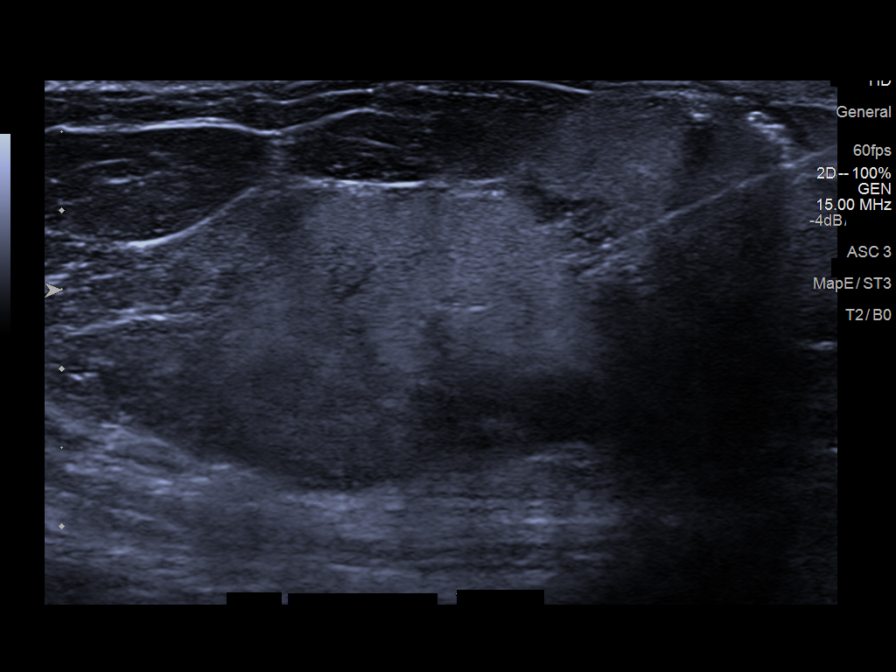

[8 of 8 positions shown; findings below may reference images not displayed]



Lesion quadrant: Upper inner quadrant

Using sterile technique and 1% Lidocaine as local anesthetic, under
direct ultrasound visualization, a 14 gauge Emiko device was
used to perform biopsy of a mass in the left breast at 11-12 o'clock
using an inferior approach. At the conclusion of the procedure a
venous shaped tissue marker clip was deployed into the biopsy
cavity. Follow up 2 view mammogram was performed and dictated
separately.
IMPRESSION: Ultrasound guided biopsy of a left breast mass at 12 o'clock. No
apparent complications.

ADDENDUM:
PATHOLOGY revealed: A. BREAST, LEFT AT [DATE], 6 CM FROM THE NIPPLE;
ULTRASOUND GUIDED CORE BIOPSY: -DIFFUSE LARGE B CELL LYMPHOMA,
FURTHER CLASSIFICATION PENDING IMMUNOHISTOCHEMICAL WORKUP.

Comment: Sections display fibroadipose tissue with a dense cellular
infiltrate comprised of large cells with high N:C ratio and
significant nuclear atypia. Mitotic figures are frequent, and
numerous apoptotic bodies are present. Residual mammary parenchyma
is not identified. Immunohistochemical studies with appropriately
reactive controls were performed, and show these abnormal cells to
be positive for CD45 (LCA) and CD20, compatible with B cells. A
superpancytokeratin is negative. The patient's remote history of
diffuse large B cell lymphoma of the breast, diagnosed in 9331, is
noted. Additional immunohistochemical and FISH studies to further
classify this process are pending, and will be reported as an
addendum. There is sufficient tissue for ancillary testing in both
tissue blocks.

Pathology results are CONCORDANT with imaging findings, per Dr.
Mahdy Nemo.

Pathology results were discussed with patient, and patient's
daughter (Lurdes Maria Bonache), via telephone. The patient reported
doing well after the biopsy with tenderness at the site. Post biopsy
care instructions were reviewed and questions were answered. The
patient was encouraged to call [HOSPITAL] for any
additional concerns. Patient's final pathology report was faxed to
Hieu Canale RN at Dr. [REDACTED].

Recommendation: Referral for medical oncologist will be arranged by
Sonnleitner Stranimaier RN, with Dr. [REDACTED]. Patient was
instructed to continue with monthly self breast examinations,
clinical follow-up as needed, and to have annual bilateral screening
mammogram in one year.

Addendum by Hieu Canale RN on 05/19/2019.



Lesion quadrant: Upper inner quadrant

Using sterile technique and 1% Lidocaine as local anesthetic, under
direct ultrasound visualization, a 14 gauge Sonnleitner Stranimaier device was
used to perform biopsy of a mass in the left breast at 11-12 o'clock
using an inferior approach. At the conclusion of the procedure a
venous shaped tissue marker clip was deployed into the biopsy
cavity. Follow up 2 view mammogram was performed and dictated
separately.
IMPRESSION: Ultrasound guided biopsy of a left breast mass at 12 o'clock. No
apparent complications.

## 2021-02-09 IMAGING — MG MM CLIP PLACEMENT
4 series · 4 of 12 positions shown · non-contrast
Comparison: Previous exam(s).

CLINICAL DATA: Post biopsy mammogram of the left breast for clip
placement.

EXAM:
DIAGNOSTIC LEFT MAMMOGRAM POST ULTRASOUND BIOPSY

[L ML synth-2D]
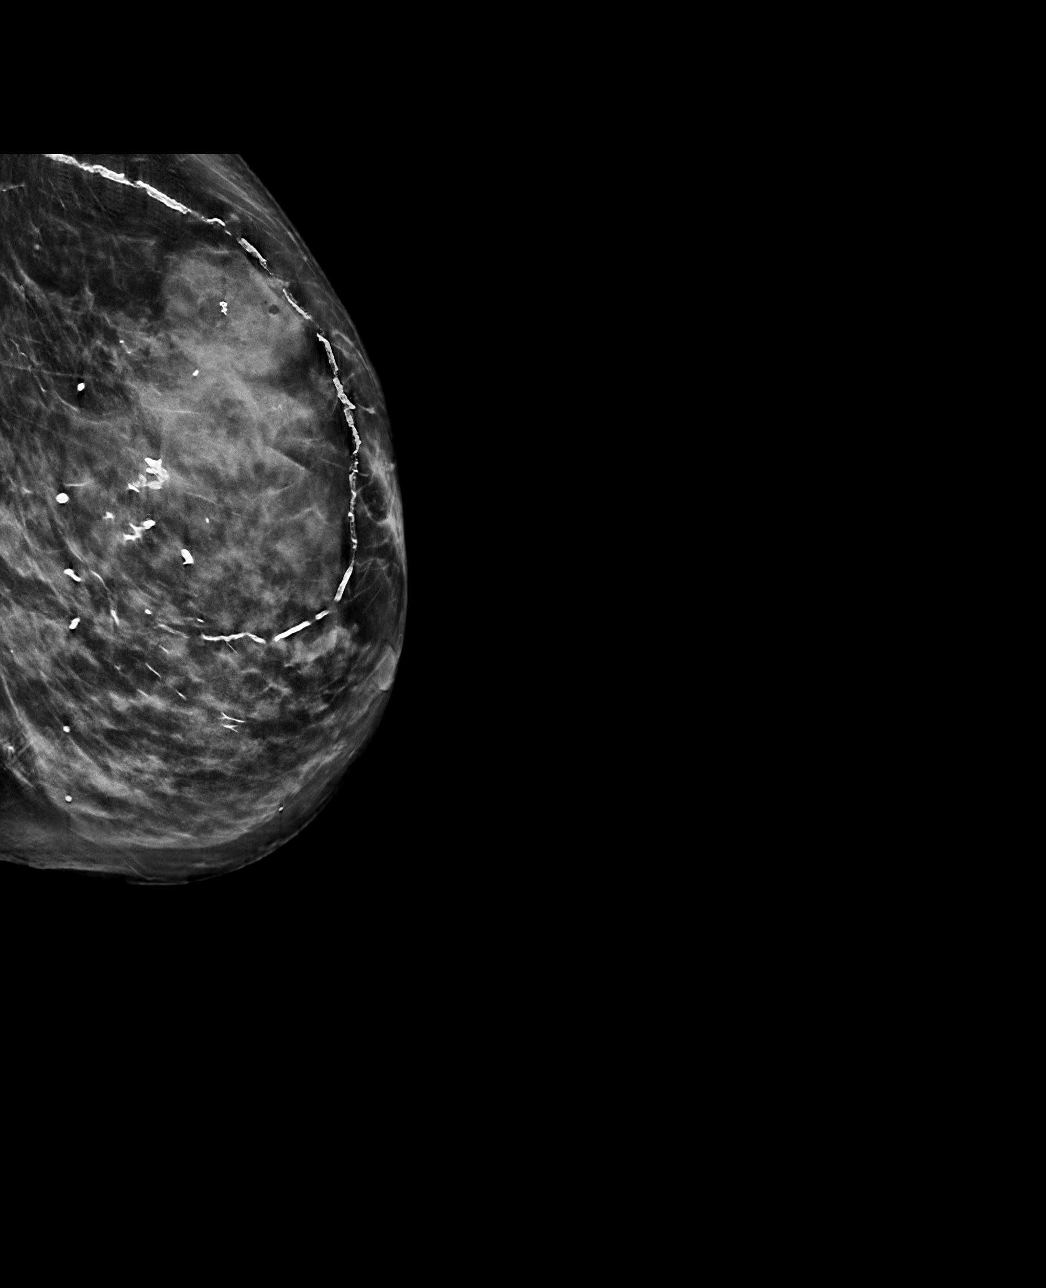

[L CC synth-2D]
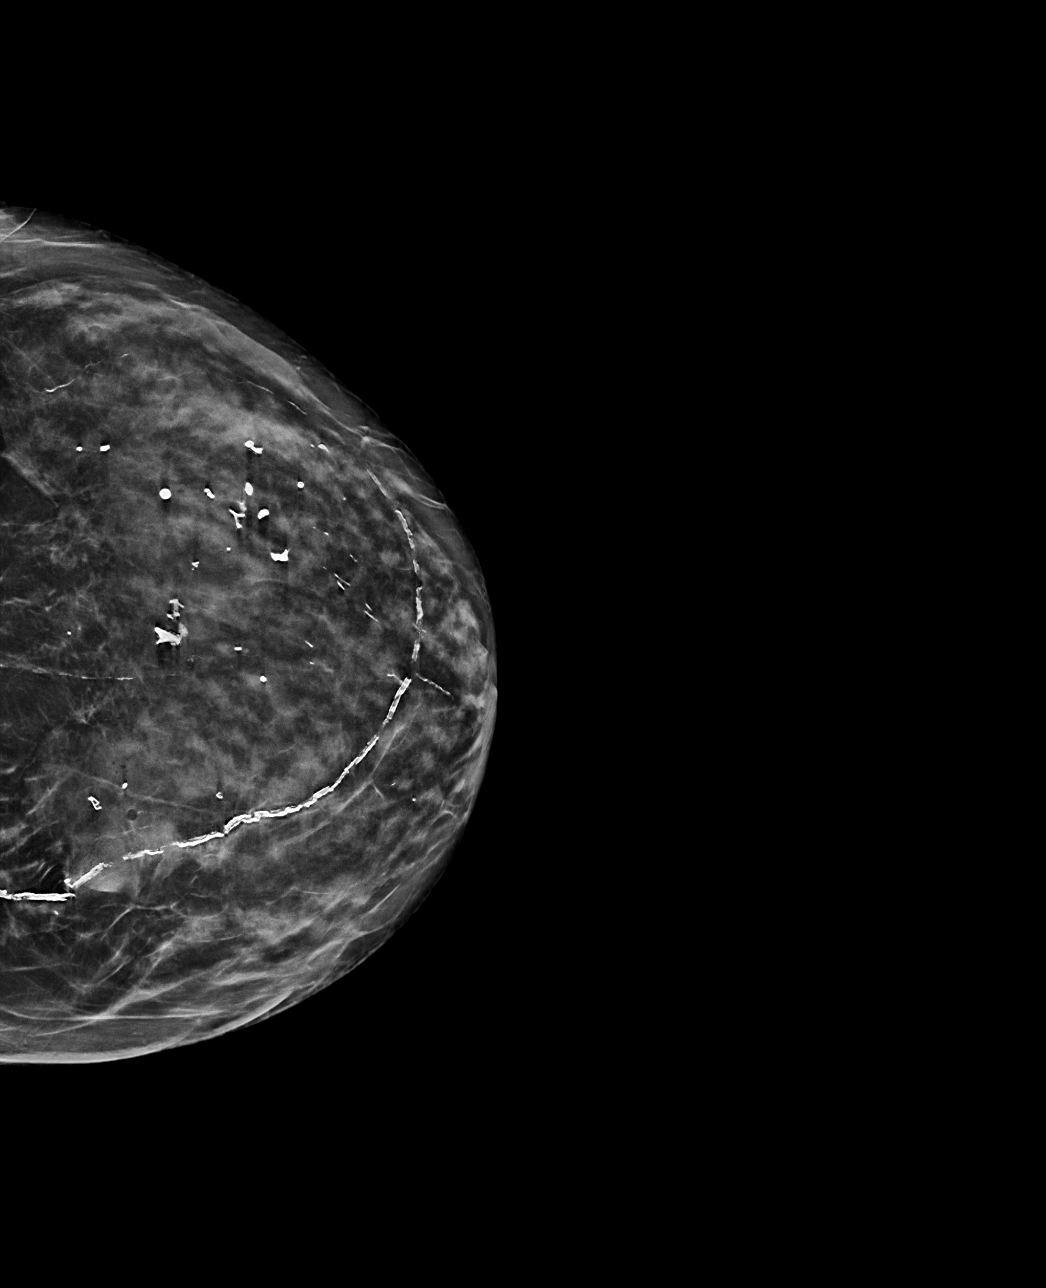

[L CC tomo · tomo slice 39/76.0]
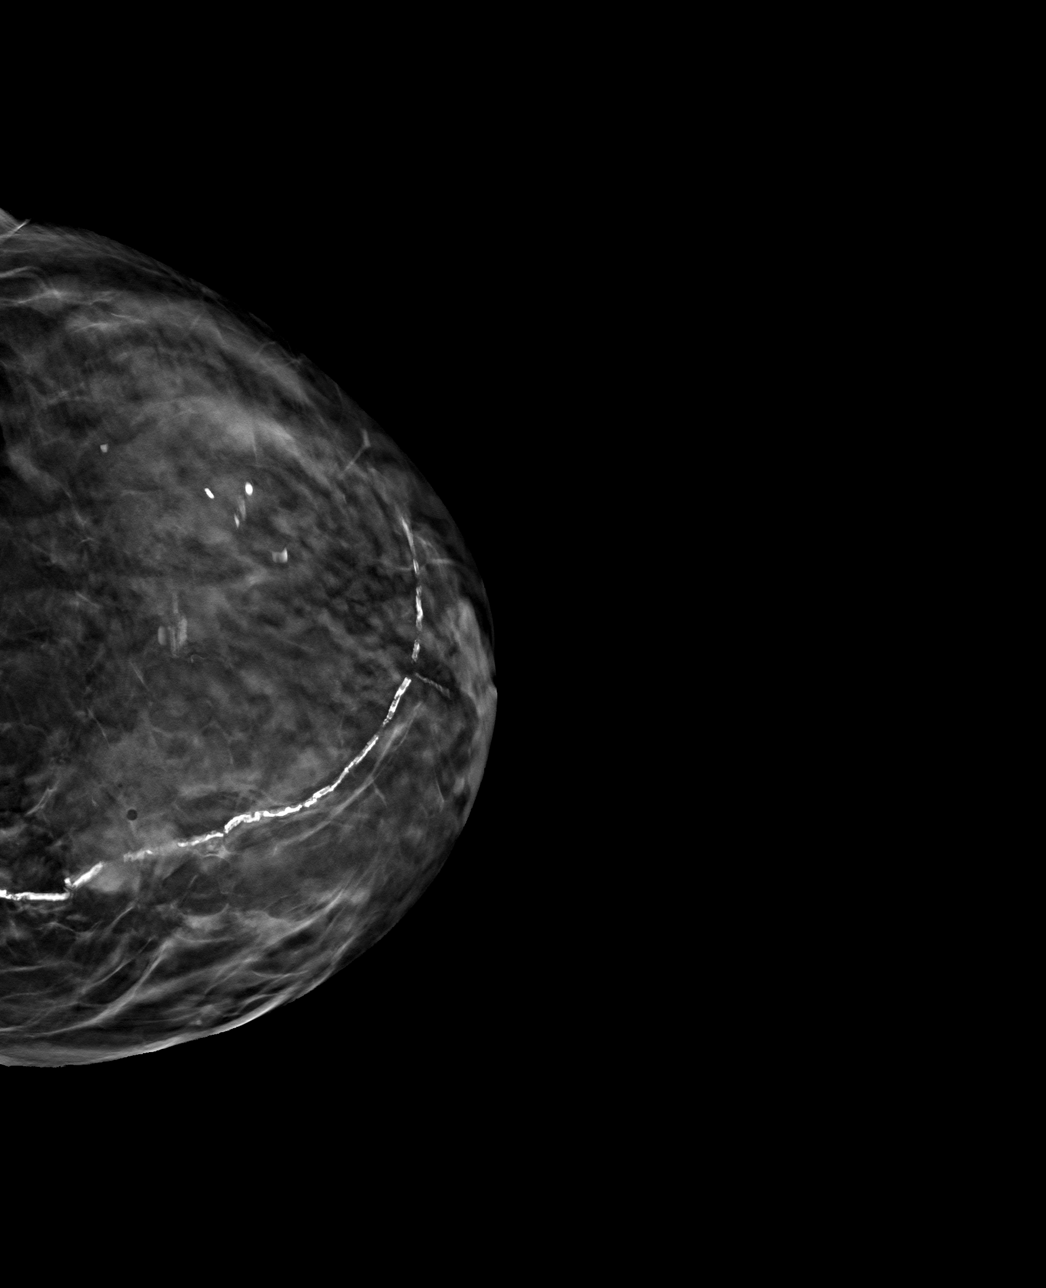

[L ML tomo · tomo slice 45/88.0]
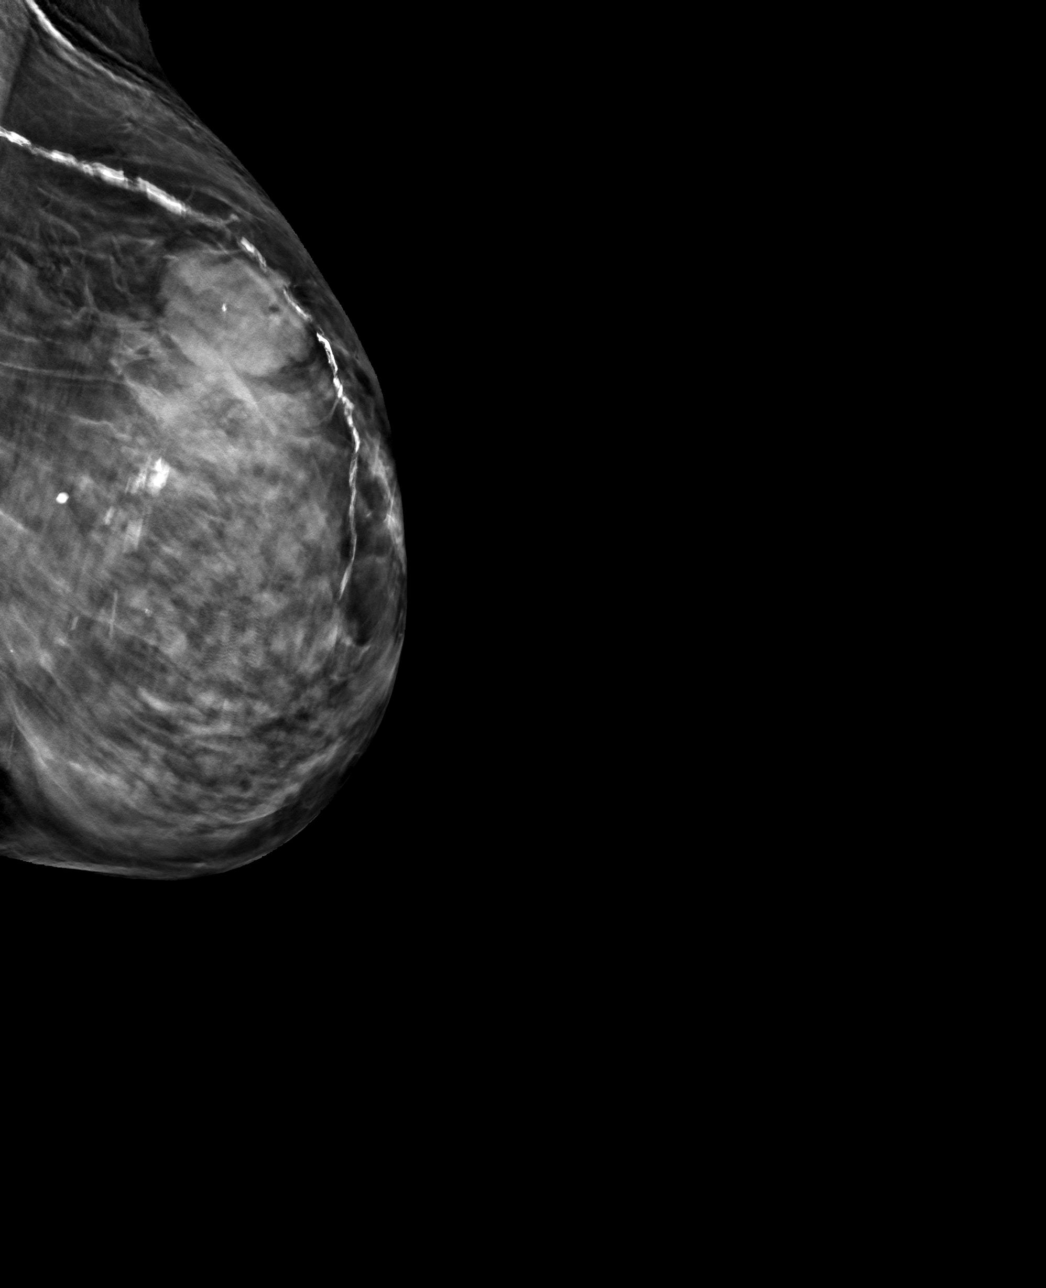

[4 of 12 positions shown; findings below may reference images not displayed]

FINDINGS: Mammographic images were obtained following ultrasound guided biopsy
of a mass in the superior left breast. The Anseliho shaped biopsy
marking clip is well positioned within the mass in the upper inner
left breast.
IMPRESSION: Appropriate positioning of the venous shaped biopsy marking clip in
the upper inner left breast.

Final Assessment: Post Procedure Mammograms for Marker Placement

## 2021-03-13 ENCOUNTER — Inpatient Hospital Stay: Payer: Medicare Other | Admitting: Internal Medicine

## 2021-03-13 ENCOUNTER — Inpatient Hospital Stay: Payer: Medicare Other | Attending: Internal Medicine

## 2021-03-13 NOTE — Assessment & Plan Note (Deleted)
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 Stage I E. S/p 4- R-mini CHOP; s/p consolidation radiation [finished Feb 19th 2021]. AUG 2021- PET scan-complete response noted in the left breast; slight uptake noted in the mediastinal lymph nodes/clinically reactive [see below].  Clinically stable-see below  #Left breast tenderness-no obvious masses noted on exam.  Question scar tissue.  Recommend diagnostic mammogram/ultrasound ASAP.  #PET scan August 2021 mediastinal lymph nodes/paratracheal mild uptake-question reactive versus progressive malignancy [clinically less likely].  Clinically stable.  Monitor for now; await above work-up.  # Dementia: Stable continues to be fairly independent.  # port/IV access-stable.  Port flush today.  Discussed explantation; discussed pro and cons- pt's preference to keep it.  # DISPOSITION: will call Kim- [phone] # Left diagnostic mammo/US ASAP # follow up TBD- Dr.B  Cc; [email or web address]

## 2021-03-13 NOTE — Progress Notes (Deleted)
Stephanie Davidson CONSULT NOTE  Patient Care Team: Derinda Late, MD as PCP - General (Family Medicine)  CHIEF COMPLAINTS/PURPOSE OF CONSULTATION: Breast lymphoma   Oncology History Overview Note  # AUG 2020- LEFT BREAST DLBCL [non-GCB subtype; Double expressor;NEG- CD-30; Ki-67-80-90%]; FISH -negative for translocations; SEP 2020- PET-left breast intense uptake; no distant disease noted.  Hold off bone marrow biopsy [patient/family preference not to be aggressive]; Stage adjusted IPI-"2 points" cure rate 50-60%.   # 9/21- Pred+Rituxan; OCT 2020- mini-R-CHOP; s/p radiation [feb 19th, 2021] -------------------------------------------------------------------------------------------- # 2006- DLBCL of Breast [s? 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    ;GOALS: Cure  CURRENT/MOST RECENT THERAPY:mini-RCHOP    Lymphoma of breast ([place])  05/25/2019 Initial Diagnosis   Lymphoma of breast ([place])   06/14/2019 -  Chemotherapy   The patient had DOXOrubicin (ADRIAMYCIN) chemo injection 36 mg, 25 mg/m2 = 36 mg (100 % of original dose 25 mg/m2), Intravenous,  Once, 4 of 4 cycles Dose modification: 25 mg/m2 (original dose 25 mg/m2, Cycle 2, Reason: Provider Judgment) Administration: 36 mg (07/05/2019), 36 mg (08/16/2019), 36 mg (07/26/2019), 36 mg (09/06/2019) palonosetron (ALOXI) injection 0.25 mg, 0.25 mg, Intravenous,  Once, 4 of 4 cycles Administration: 0.25 mg (07/05/2019), 0.25 mg (08/16/2019), 0.25 mg (07/26/2019), 0.25 mg (09/06/2019) pegfilgrastim-jmdb (FULPHILA) injection 6 mg, 6 mg, Subcutaneous,  Once, 4 of 4 cycles Administration: 6 mg (07/06/2019), 6 mg (08/17/2019), 6 mg (07/27/2019), 6 mg (09/07/2019) vinCRIStine (ONCOVIN) 2 mg in sodium chloride 0.9 % 50 mL chemo infusion, 2 mg, Intravenous,  Once, 4 of 4 cycles Administration: 2 mg  (07/05/2019), 2 mg (08/16/2019), 2 mg (07/26/2019), 2 mg (09/06/2019) riTUXimab (RITUXAN) 500 mg in sodium chloride 0.9 % 250 mL (1.6667 mg/mL) infusion, 375 mg/m2 = 500 mg, Intravenous,  Once, 1 of 1 cycle Administration: 500 mg (06/14/2019) cyclophosphamide (CYTOXAN) 580 mg in sodium chloride 0.9 % 250 mL chemo infusion, 400 mg/m2 = 580 mg (100 % of original dose 400 mg/m2), Intravenous,  Once, 4 of 4 cycles Dose modification: 600 mg/m2 (original dose 400 mg/m2, Cycle 2, Reason: Provider Judgment), 400 mg/m2 (original dose 400 mg/m2, Cycle 2, Reason: Provider Judgment) Administration: 580 mg (07/05/2019), 580 mg (08/16/2019), 580 mg (07/26/2019), 580 mg (09/06/2019) fosaprepitant (EMEND) 150 mg, dexamethasone (DECADRON) 12 mg in sodium chloride 0.9 % 145 mL IVPB, , Intravenous,  Once, 4 of 4 cycles Administration:  (07/05/2019),  (08/16/2019),  (07/26/2019),  (09/06/2019) riTUXimab-pvvr (RUXIENCE) 500 mg in sodium chloride 0.9 % 250 mL (1.6667 mg/mL) infusion, 375 mg/m2 = 500 mg (100 % of original dose 375 mg/m2), Intravenous,  Once, 1 of 1 cycle Dose modification: 375 mg/m2 (original dose 375 mg/m2, Cycle 2, Reason: Other (see comments), Comment: insurance)  for chemotherapy treatment.       HISTORY OF PRESENTING ILLNESS: Patient is accompanied by her son. Stephanie Davidson 87 y.o.  female diffuse large B-cell lymphoma of the left breast stage I E/recurrent currently on mini R-CHOP-is here for follow-up.  Patient complains of left breast discomfort intermittently.  She also complains of itching.  Denies any trauma.  Denies any skin rash.  No nausea no vomiting pain no headaches.  No shortness of breath.  No night sweats.  Review of Systems  Constitutional:  Negative for chills, diaphoresis, fever and malaise/fatigue.  HENT:  Negative for nosebleeds and sore throat.  Eyes:  Negative for double vision.  Respiratory:  Negative for cough, hemoptysis, sputum production, shortness of breath and  wheezing.   Cardiovascular:  Negative for chest pain, palpitations, orthopnea and leg swelling.  Gastrointestinal:  Negative for abdominal pain, blood in stool, constipation, diarrhea, heartburn, melena, nausea and vomiting.  Genitourinary:  Negative for dysuria, frequency and urgency.  Musculoskeletal:  Positive for back pain and joint pain.  Skin: Negative.  Negative for itching and rash.  Neurological:  Negative for dizziness, tingling, focal weakness, weakness and headaches.  Endo/Heme/Allergies:  Does not bruise/bleed easily.  Psychiatric/Behavioral:  Positive for memory loss. Negative for depression. The patient is not nervous/anxious and does not have insomnia.      MEDICAL HISTORY:  Past Medical History:  Diagnosis Date  . Breast cancer ([place])   . Hypertension   . Personal history of chemotherapy   . Personal history of radiation therapy     SURGICAL HISTORY: Past Surgical History:  Procedure Laterality Date  . BREAST BIOPSY Right ?   needle bx-benign  . BREAST BIOPSY Left 05/12/2019   [place] bx 12:00, venus marker, DIFFUSE LARGE B CELL LYMPHOMA  . BREAST EXCISIONAL BIOPSY Left 2003   lymphoma  . BREAST EXCISIONAL BIOPSY Left ?   lump was removed, benign  . IR IMAGING GUIDED PORT INSERTION  06/02/2019    SOCIAL HISTORY: Social History   Socioeconomic History  . Marital status: Widowed    Spouse name: Not on file  . Number of children: 2  . Years of education: Not on file  . Highest education level: Not on file  Occupational History  . Not on file  Tobacco Use  . Smoking status: Never  . Smokeless tobacco: Never  Vaping Use  . Vaping Use: Never used  Substance and Sexual Activity  . Alcohol use: No  . Drug use: Not on file  . Sexual activity: Not on file  Other Topics Concern  . Not on file  Social History Narrative   [place]; own apartment; no smoking; no alcohol. 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    Social Determinants of Health   Financial Resource Strain: Not on file   Food Insecurity: Not on file  Transportation Needs: Not on file  Physical Activity: Not on file  Stress: Not on file  Social Connections: Not on file  Intimate Partner Violence: Not on file    FAMILY HISTORY: Family History  Problem Relation Age of Onset  . Breast cancer Neg Hx     ALLERGIES:  is allergic to latex.  MEDICATIONS:  Current Outpatient Medications  Medication Sig Dispense Refill  . amLODipine (NORVASC) 10 MG tablet Take 1 tablet by mouth daily.    Marland Kitchen donepezil (ARICEPT) 10 MG tablet Take 1 tablet by mouth at bedtime.    . fluticasone (FLONASE) 50 MCG/ACT nasal spray Place 2 sprays into both nostrils daily.     . hydrochlorothiazide (HYDRODIURIL) 25 MG tablet Take 1 tablet by mouth daily.    . irbesartan (AVAPRO) 300 MG tablet Take 1 tablet by mouth daily.    Marland Kitchen lidocaine-prilocaine (EMLA) cream Apply to affected area once 30 g 3  . meloxicam (MOBIC) 15 MG tablet Take 1 tablet by mouth as needed.     . Multiple Vitamin (MULTI-VITAMIN) tablet Take 1 tablet by mouth daily.     No current facility-administered medications for this visit.   Facility-Administered Medications Ordered in Other Visits  Medication Dose Route Frequency Provider Last Rate Last Admin  . heparin  lock flush 100 unit/mL  500 Units Intravenous Once Faythe Casa E, NP      . sodium chloride flush (NS) 0.9 % injection 10 mL  10 mL Intravenous PRN Jacquelin Hawking, NP          .  PHYSICAL EXAMINATION: ECOG PERFORMANCE STATUS: 1 - Symptomatic but completely ambulatory  There were no vitals filed for this visit.  There were no vitals filed for this visit.   Physical Exam Constitutional:      Comments: Accompanied by her daughter.  She is walking herself.  HENT:     Head: Normocephalic and atraumatic.     Mouth/Throat:     Pharynx: No oropharyngeal exudate.  Eyes:     Pupils: Pupils are equal, round, and reactive to light.  Cardiovascular:     Rate and Rhythm: Normal rate and  regular rhythm.  Pulmonary:     Effort: Pulmonary effort is normal. No respiratory distress.     Breath sounds: Normal breath sounds. No wheezing.  Abdominal:     General: Bowel sounds are normal. There is no distension.     Palpations: Abdomen is soft. There is no mass.     Tenderness: There is no abdominal tenderness. There is no guarding or rebound.  Musculoskeletal:        General: No tenderness. Normal range of motion.     Cervical back: Normal range of motion and neck supple.  Skin:    General: Skin is warm.     Comments: Left breast upper inner quadrant-no obvious masses noted.  Question scar tissue.  Tenderness noted no skin changes noted.  No nipple changes noted.  Neurological:     Mental Status: She is alert and oriented to person, place, and time.  Psychiatric:        Mood and Affect: Affect normal.     LABORATORY DATA:  I have reviewed the data as listed Lab Results  Component Value Date   WBC 6.9 11/30/2020   HGB 12.6 11/30/2020   HCT 38.8 11/30/2020   MCV 93.0 11/30/2020   PLT 287 11/30/2020   Recent Labs    06/22/20 1108 11/30/20 0930  NA 136 137  K 4.1 4.9  CL 99 101  CO2 28 29  GLUCOSE 111* 98  BUN 24* 28*  CREATININE 0.93 0.80  CALCIUM 9.7 9.8  GFRNONAA 55* >60  GFRAA >60  --   PROT 7.5 7.1  ALBUMIN 4.7 4.2  AST 24 20  ALT 15 14  ALKPHOS 115 116  BILITOT 0.7 0.8     RADIOGRAPHIC STUDIES: I have personally reviewed the radiological images as listed and agreed with the findings in the report. No results found.  ASSESSMENT & PLAN:   No problem-specific Assessment & Plan notes found for this encounter.  All questions were answered. The patient knows to call the clinic with any problems, questions or concerns.    Cammie Sickle, MD 03/13/2021 9:13 AM

## 2021-03-28 ENCOUNTER — Inpatient Hospital Stay: Payer: Medicare Other | Attending: Hospice and Palliative Medicine | Admitting: Hospice and Palliative Medicine

## 2021-03-28 DIAGNOSIS — I10 Essential (primary) hypertension: Secondary | ICD-10-CM | POA: Insufficient documentation

## 2021-03-28 DIAGNOSIS — F0390 Unspecified dementia without behavioral disturbance: Secondary | ICD-10-CM | POA: Insufficient documentation

## 2021-03-28 DIAGNOSIS — C8330 Diffuse large B-cell lymphoma, unspecified site: Secondary | ICD-10-CM | POA: Insufficient documentation

## 2021-03-28 DIAGNOSIS — Z923 Personal history of irradiation: Secondary | ICD-10-CM | POA: Insufficient documentation

## 2021-03-28 DIAGNOSIS — C8599 Non-Hodgkin lymphoma, unspecified, extranodal and solid organ sites: Secondary | ICD-10-CM | POA: Diagnosis not present

## 2021-03-28 DIAGNOSIS — Z9221 Personal history of antineoplastic chemotherapy: Secondary | ICD-10-CM | POA: Insufficient documentation

## 2021-03-28 DIAGNOSIS — Z79899 Other long term (current) drug therapy: Secondary | ICD-10-CM | POA: Insufficient documentation

## 2021-03-28 NOTE — Progress Notes (Signed)
Virtual Visit via Telephone Note  I connected with Stephanie Davidson on 03/28/21 at 11:30 AM EDT by telephone and verified that I am speaking with the correct person using two identifiers.   I discussed the limitations, risks, security and privacy concerns of performing an evaluation and management service by telephone and the availability of in person appointments. I also discussed with the patient that there may be a patient responsible charge related to this service. The patient expressed understanding and agreed to proceed.   History of Present Illness: [name] is an 87 y.o. 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  Patient had severe symptoms from previous treatment and was initially reluctant to pursue further cancer treatment.  She agreed with mini-RCHOP. Patient was referred to palliative care to help address goals and provide with support.   Observations/Objective: I called and spoke with patient by phone.  Patient reports that she is doing well.  She denies any significant changes or concerns.  No symptomatic complaints at present.  Patient reports that she still walking daily.  Appetite is good and she denies weight loss.  No B symptoms reported.  No issues with medications nor need for refills.  Patient sees Dr. Rogue Bussing next week.  Assessment and Plan: Lymphoma -status post mini R-CHOP and RT.  Followed by Dr. Rogue Bussing.  Seems to be doing well overall.  Follow Up Instructions: Follow-up MyChart visit 6 months   I discussed the assessment and treatment plan with the patient. The patient was provided an opportunity to ask questions and all were answered. The patient agreed with the plan and demonstrated an understanding of the instructions.   The patient was advised to call back or seek an in-person evaluation if the symptoms worsen or if the  condition fails to improve as anticipated.  I provided 5 minutes of non-face-to-face time during this encounter.   Irean Hong, NP

## 2021-04-04 ENCOUNTER — Inpatient Hospital Stay (HOSPITAL_BASED_OUTPATIENT_CLINIC_OR_DEPARTMENT_OTHER): Payer: Medicare Other | Admitting: Internal Medicine

## 2021-04-04 ENCOUNTER — Inpatient Hospital Stay: Payer: Medicare Other

## 2021-04-04 VITALS — BP 155/58 | HR 86 | Temp 98.0°F | Resp 18 | Wt 117.0 lb

## 2021-04-04 DIAGNOSIS — Z79899 Other long term (current) drug therapy: Secondary | ICD-10-CM | POA: Diagnosis not present

## 2021-04-04 DIAGNOSIS — C8599 Non-Hodgkin lymphoma, unspecified, extranodal and solid organ sites: Secondary | ICD-10-CM | POA: Diagnosis not present

## 2021-04-04 DIAGNOSIS — Z9221 Personal history of antineoplastic chemotherapy: Secondary | ICD-10-CM | POA: Diagnosis not present

## 2021-04-04 DIAGNOSIS — Z923 Personal history of irradiation: Secondary | ICD-10-CM | POA: Diagnosis not present

## 2021-04-04 DIAGNOSIS — F0390 Unspecified dementia without behavioral disturbance: Secondary | ICD-10-CM | POA: Diagnosis not present

## 2021-04-04 DIAGNOSIS — I10 Essential (primary) hypertension: Secondary | ICD-10-CM | POA: Diagnosis not present

## 2021-04-04 DIAGNOSIS — C8330 Diffuse large B-cell lymphoma, unspecified site: Secondary | ICD-10-CM | POA: Diagnosis not present

## 2021-04-04 LAB — CBC WITH DIFFERENTIAL/PLATELET
Abs Immature Granulocytes: 0.03 10*3/uL (ref 0.00–0.07)
Basophils Absolute: 0 10*3/uL (ref 0.0–0.1)
Basophils Relative: 0 %
Eosinophils Absolute: 0.2 10*3/uL (ref 0.0–0.5)
Eosinophils Relative: 2 %
HCT: 36.7 % (ref 36.0–46.0)
Hemoglobin: 11.9 g/dL — ABNORMAL LOW (ref 12.0–15.0)
Immature Granulocytes: 0 %
Lymphocytes Relative: 22 %
Lymphs Abs: 1.6 10*3/uL (ref 0.7–4.0)
MCH: 30 pg (ref 26.0–34.0)
MCHC: 32.4 g/dL (ref 30.0–36.0)
MCV: 92.4 fL (ref 80.0–100.0)
Monocytes Absolute: 0.5 10*3/uL (ref 0.1–1.0)
Monocytes Relative: 7 %
Neutro Abs: 4.8 10*3/uL (ref 1.7–7.7)
Neutrophils Relative %: 69 %
Platelets: 269 10*3/uL (ref 150–400)
RBC: 3.97 MIL/uL (ref 3.87–5.11)
RDW: 13.6 % (ref 11.5–15.5)
WBC: 7.1 10*3/uL (ref 4.0–10.5)
nRBC: 0 % (ref 0.0–0.2)

## 2021-04-04 LAB — LACTATE DEHYDROGENASE: LDH: 174 U/L (ref 98–192)

## 2021-04-04 LAB — COMPREHENSIVE METABOLIC PANEL
ALT: 14 U/L (ref 0–44)
AST: 20 U/L (ref 15–41)
Albumin: 4.5 g/dL (ref 3.5–5.0)
Alkaline Phosphatase: 118 U/L (ref 38–126)
Anion gap: 8 (ref 5–15)
BUN: 29 mg/dL — ABNORMAL HIGH (ref 8–23)
CO2: 27 mmol/L (ref 22–32)
Calcium: 9.3 mg/dL (ref 8.9–10.3)
Chloride: 98 mmol/L (ref 98–111)
Creatinine, Ser: 0.86 mg/dL (ref 0.44–1.00)
GFR, Estimated: >60 mL/min (ref >60)
Glucose, Bld: 102 mg/dL — ABNORMAL HIGH (ref 70–99)
Potassium: 4.8 mmol/L (ref 3.5–5.1)
Sodium: 133 mmol/L — ABNORMAL LOW (ref 135–145)
Total Bilirubin: 0.5 mg/dL (ref 0.3–1.2)
Total Protein: 7.2 g/dL (ref 6.5–8.1)

## 2021-04-04 NOTE — Progress Notes (Signed)
[facility] CONSULT NOTE  Patient Care Team: Derinda Late, MD as PCP - General (Family Medicine)  CHIEF COMPLAINTS/PURPOSE OF CONSULTATION: Breast lymphoma   Oncology History Overview Note  # AUG 2020- LEFT BREAST DLBCL [non-GCB subtype; Double expressor;NEG- CD-30; Ki-67-80-90%]; FISH -negative for translocations; SEP 2020- PET-left breast intense uptake; no distant disease noted.  Hold off bone marrow biopsy [patient/family preference not to be aggressive]; Stage adjusted IPI-"2 points" cure rate 50-60%.   # 9/21- Pred+Rituxan; OCT 2020- mini-R-CHOP; s/p radiation [feb 19th, 2021] -------------------------------------------------------------------------------------------- # 2006- DLBCL of Breast [s? 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    ;GOALS: Cure  CURRENT/MOST RECENT THERAPY:mini-RCHOP    Lymphoma of breast ([place])  05/25/2019 Initial Diagnosis   Lymphoma of breast ([place])   06/14/2019 -  Chemotherapy   The patient had DOXOrubicin (ADRIAMYCIN) chemo injection 36 mg, 25 mg/m2 = 36 mg (100 % of original dose 25 mg/m2), Intravenous,  Once, 4 of 4 cycles Dose modification: 25 mg/m2 (original dose 25 mg/m2, Cycle 2, Reason: Provider Judgment) Administration: 36 mg (07/05/2019), 36 mg (08/16/2019), 36 mg (07/26/2019), 36 mg (09/06/2019) palonosetron (ALOXI) injection 0.25 mg, 0.25 mg, Intravenous,  Once, 4 of 4 cycles Administration: 0.25 mg (07/05/2019), 0.25 mg (08/16/2019), 0.25 mg (07/26/2019), 0.25 mg (09/06/2019) pegfilgrastim-jmdb (FULPHILA) injection 6 mg, 6 mg, Subcutaneous,  Once, 4 of 4 cycles Administration: 6 mg (07/06/2019), 6 mg (08/17/2019), 6 mg (07/27/2019), 6 mg (09/07/2019) vinCRIStine (ONCOVIN) 2 mg in sodium chloride 0.9 % 50 mL chemo infusion, 2 mg, Intravenous,  Once, 4 of 4 cycles Administration: 2 mg  (07/05/2019), 2 mg (08/16/2019), 2 mg (07/26/2019), 2 mg (09/06/2019) riTUXimab (RITUXAN) 500 mg in sodium chloride 0.9 % 250 mL (1.6667 mg/mL) infusion, 375 mg/m2 = 500 mg, Intravenous,  Once, 1 of 1 cycle Administration: 500 mg (06/14/2019) cyclophosphamide (CYTOXAN) 580 mg in sodium chloride 0.9 % 250 mL chemo infusion, 400 mg/m2 = 580 mg (100 % of original dose 400 mg/m2), Intravenous,  Once, 4 of 4 cycles Dose modification: 600 mg/m2 (original dose 400 mg/m2, Cycle 2, Reason: Provider Judgment), 400 mg/m2 (original dose 400 mg/m2, Cycle 2, Reason: Provider Judgment) Administration: 580 mg (07/05/2019), 580 mg (08/16/2019), 580 mg (07/26/2019), 580 mg (09/06/2019) fosaprepitant (EMEND) 150 mg, dexamethasone (DECADRON) 12 mg in sodium chloride 0.9 % 145 mL IVPB, , Intravenous,  Once, 4 of 4 cycles Administration:  (07/05/2019),  (08/16/2019),  (07/26/2019),  (09/06/2019) riTUXimab-pvvr (RUXIENCE) 500 mg in sodium chloride 0.9 % 250 mL (1.6667 mg/mL) infusion, 375 mg/m2 = 500 mg (100 % of original dose 375 mg/m2), Intravenous,  Once, 1 of 1 cycle Dose modification: 375 mg/m2 (original dose 375 mg/m2, Cycle 2, Reason: Other (see comments), Comment: insurance)   for chemotherapy treatment.        HISTORY OF PRESENTING ILLNESS: Patient is accompanied by her son. Stephanie Davidson 87 y.o.  female diffuse large B-cell lymphoma of the left breast stage I E/recurrent currently on mini R-CHOP-is here for follow-up.  At last visit in March 2022 patient had a mammogram/ultrasound for left breast discomfort.  Work-up was negative.  Denies any new lumps or bumps.  No headaches.  No nausea vomiting.  No fevers no chills.  Review of Systems  Constitutional:  Negative for chills, diaphoresis, fever and malaise/fatigue.  HENT:  Negative for nosebleeds and sore throat.  Eyes:  Negative for double vision.  Respiratory:  Negative for cough, hemoptysis, sputum production, shortness of breath and wheezing.    Cardiovascular:  Negative for chest pain, palpitations, orthopnea and leg swelling.  Gastrointestinal:  Negative for abdominal pain, blood in stool, constipation, diarrhea, heartburn, melena, nausea and vomiting.  Genitourinary:  Negative for dysuria, frequency and urgency.  Musculoskeletal:  Positive for back pain and joint pain.  Skin: Negative.  Negative for itching and rash.  Neurological:  Negative for dizziness, tingling, focal weakness, weakness and headaches.  Endo/Heme/Allergies:  Does not bruise/bleed easily.  Psychiatric/Behavioral:  Positive for memory loss. Negative for depression. The patient is not nervous/anxious and does not have insomnia.     MEDICAL HISTORY:  Past Medical History:  Diagnosis Date   Breast cancer ([place])    Hypertension    Personal history of chemotherapy    Personal history of radiation therapy     SURGICAL HISTORY: Past Surgical History:  Procedure Laterality Date   BREAST BIOPSY Right ?   needle bx-benign   BREAST BIOPSY Left 05/12/2019   [place] bx 12:00, venus marker, DIFFUSE LARGE B CELL LYMPHOMA   BREAST EXCISIONAL BIOPSY Left 2003   lymphoma   BREAST EXCISIONAL BIOPSY Left ?   lump was removed, benign   IR IMAGING GUIDED PORT INSERTION  06/02/2019    SOCIAL HISTORY: Social History   Socioeconomic History   Marital status: Widowed    Spouse name: Not on file   Number of children: 2   Years of education: Not on file   Highest education level: Not on file  Occupational History   Not on file  Tobacco Use   Smoking status: Never   Smokeless tobacco: Never  Vaping Use   Vaping Use: Never used  Substance and Sexual Activity   Alcohol use: No   Drug use: Not on file   Sexual activity: Not on file  Other Topics Concern   Not on file  Social History Narrative   [place]; own apartment; no smoking; no alcohol. 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    Social Determinants of Health   Financial Resource Strain: Not on file  Food Insecurity: Not on file   Transportation Needs: Not on file  Physical Activity: Not on file  Stress: Not on file  Social Connections: Not on file  Intimate Partner Violence: Not on file    FAMILY HISTORY: Family History  Problem Relation Age of Onset   Breast cancer Neg Hx     ALLERGIES:  is allergic to latex.  MEDICATIONS:  Current Outpatient Medications  Medication Sig Dispense Refill   amLODipine (NORVASC) 10 MG tablet Take 1 tablet by mouth daily.     donepezil (ARICEPT) 10 MG tablet Take 1 tablet by mouth at bedtime.     hydrochlorothiazide (HYDRODIURIL) 25 MG tablet Take 1 tablet by mouth daily.     irbesartan (AVAPRO) 300 MG tablet Take 1 tablet by mouth daily.     melatonin 1 MG TABS tablet Take by mouth.     meloxicam (MOBIC) 15 MG tablet Take 1 tablet by mouth as needed.      meloxicam (MOBIC) 15 MG tablet Take 1 tablet by mouth daily.     traZODone (DESYREL) 50 MG tablet Take 1 tablet by mouth at bedtime.     fluticasone (FLONASE) 50 MCG/ACT nasal spray Place 2 sprays into both nostrils daily.  (Patient not taking: Reported on 04/04/2021)     lidocaine-prilocaine (EMLA) cream Apply to affected area  once (Patient not taking: Reported on 04/04/2021) 30 g 3   Multiple Vitamin (MULTI-VITAMIN) tablet Take 1 tablet by mouth daily. (Patient not taking: Reported on 04/04/2021)     No current facility-administered medications for this visit.   Facility-Administered Medications Ordered in Other Visits  Medication Dose Route Frequency Provider Last Rate Last Admin   heparin lock flush 100 unit/mL  500 Units Intravenous Once Faythe Casa E, NP       sodium chloride flush (NS) 0.9 % injection 10 mL  10 mL Intravenous PRN Jacquelin Hawking, NP          .  PHYSICAL EXAMINATION: ECOG PERFORMANCE STATUS: 1 - Symptomatic but completely ambulatory  Vitals:   04/04/21 1526  BP: (!) 155/58  Pulse: 86  Resp: 18  Temp: 98 F (36.7 C)  SpO2: 100%   Filed Weights   04/04/21 1526  Weight: 117 lb  (53.1 kg)    Physical Exam Constitutional:      Comments: Accompanied by her daughter.  She is walking herself.  HENT:     Head: Normocephalic and atraumatic.     Mouth/Throat:     Pharynx: No oropharyngeal exudate.  Eyes:     Pupils: Pupils are equal, round, and reactive to light.  Cardiovascular:     Rate and Rhythm: Normal rate and regular rhythm.  Pulmonary:     Effort: Pulmonary effort is normal. No respiratory distress.     Breath sounds: Normal breath sounds. No wheezing.  Abdominal:     General: Bowel sounds are normal. There is no distension.     Palpations: Abdomen is soft. There is no mass.     Tenderness: no abdominal tenderness There is no guarding or rebound.  Musculoskeletal:        General: No tenderness. Normal range of motion.     Cervical back: Normal range of motion and neck supple.  Skin:    General: Skin is warm.     Comments: Left breast upper inner quadrant-no obvious masses noted.  Question scar tissue.  Tenderness noted no skin changes noted.  No nipple changes noted.  Neurological:     Mental Status: She is alert and oriented to person, place, and time.  Psychiatric:        Mood and Affect: Affect normal.     LABORATORY DATA:  I have reviewed the data as listed Lab Results  Component Value Date   WBC 7.1 04/04/2021   HGB 11.9 (L) 04/04/2021   HCT 36.7 04/04/2021   MCV 92.4 04/04/2021   PLT 269 04/04/2021   Recent Labs    06/22/20 1108 11/30/20 0930 04/04/21 1504  NA 136 137 133*  K 4.1 4.9 4.8  CL 99 101 98  CO2 _0 GLUCOSE 111* 98 102*  BUN 24* 28* 29*  CREATININE 0.93 0.80 0.86  CALCIUM 9.7 9.8 9.3  GFRNONAA 55* >60 >60  GFRAA >60  --   --   PROT 7.5 7.1 7.2  ALBUMIN 4.7 4.2 4.5  AST _1 ALT _2 ALKPHOS 115 116 118  BILITOT 0.7 0.8 0.5    RADIOGRAPHIC STUDIES: I have personally reviewed the radiological images as listed and agreed with the findings in the report. No results found.  ASSESSMENT & PLAN:    Lymphoma of breast ([place]) # Diffuse large B-cell lymphoma left breast Non-GCB subtype; double expresser. Stage I E. S/p 4- R-mini CHOP; s/p consolidation radiation [finished Feb 19th  2021]. AUG 2021- PET scan-complete response noted in the left breast; slight uptake noted in the mediastinal lymph nodes/clinically reactive [see below].  Clinically stable-see below  #Left breast tenderness-no obvious masses noted on exam.  Question scar tissue.  Recommend diagnostic mammogram/ultrasound ASAP.  #PET scan August 2021 mediastinal lymph nodes/paratracheal mild uptake-question reactive versus progressive malignancy [clinically less likely].  Clinically stable.  Monitor for now; await above work-up.  # Dementia: Stable continues to be fairly independent.  # port/IV access-stable.  Port flush today.  Discussed explantation; discussed pro and cons- pt's preference to keep it.  # DISPOSITION: will call [name] # follow up in feb 1st week, 2023- MD; labs- cbc/cmp/LDH-Dr.B  Cc; Dr.Baboff  All questions were answered. The patient knows to call the clinic with any problems, questions or concerns.    Cammie Sickle, MD 04/29/2021 10:14 PM

## 2021-04-04 NOTE — Assessment & Plan Note (Addendum)
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 Stage I E. S/p 4- R-mini CHOP; s/p consolidation radiation [finished Feb 19th 2021]. AUG 2021- PET scan-complete response noted in the left breast; slight uptake noted in the mediastinal lymph nodes/clinically reactive [see below].  Clinically stable-see below  #Left breast tenderness-no obvious masses noted on exam.  Question scar tissue; March 2022-ultrasound/mammogram negative for any recurrent disease.  # Dementia: Stable continues to be fairly independent.  # port/IV access-stable.  Port flush today.  Discussed explantation; discussed pro and cons- pt's preference to keep it.  # DISPOSITION: # follow up in feb 1st week, 2023- MD; labs- cbc/cmp/LDH-Dr.B  Cc; [facility]

## 2021-04-29 ENCOUNTER — Encounter: Payer: Self-pay | Admitting: Internal Medicine

## 2021-10-02 ENCOUNTER — Telehealth: Payer: Medicare Other | Admitting: Hospice and Palliative Medicine

## 2021-10-09 ENCOUNTER — Other Ambulatory Visit: Payer: Medicare Other

## 2021-10-09 ENCOUNTER — Other Ambulatory Visit: Payer: Self-pay

## 2021-10-09 DIAGNOSIS — Z515 Encounter for palliative care: Secondary | ICD-10-CM

## 2021-10-09 NOTE — Progress Notes (Signed)
PATIENT NAME: Stephanie Davidson DOB: July 24, 1931 MRN: [id number]  PRIMARY CARE PROVIDER: Derinda Late, MD  RESPONSIBLE PARTY:  Acct ID - Guarantor Home Phone Work Phone Relationship Acct Type  [id number] [place] [phone]  Self P/F     [address] [place], [place], [place] [address]    Due to the COVID-19 crisis, this visit was done via telemedicine from my office and it was initiated and consent by this patient and or family.  I connected with  YARIMAR LAVIS OR PROXY on 10/09/21 by telephonie and verified that I am speaking with the correct person using two identifiers.   I discussed the limitations of evaluation and management by telemedicine. The patient expressed understanding and agreed to proceed.    Follow up phone call completed with patient at the request of Charlann Boxer, NP.  Patient endorses doing very well given her age.  She continues to be very active and is walking without assistive devices.  She reports being apart of an exercise group before COVID occurred and now things are limited for her.  She continues to exercise as much as she is able too.  Appetite has remained good and she endorses her weight is stable at 117 lbs.  Patient advised she is sleeping well and typically averages 6 hours nightly.  Home visit offered with our Palliative Care NP but patient declines services at this time.  She reports being in contact with her PCP with any new issues and did not feel she needed additional support from Palliative Care.  Advised to contact Palliative Care should assistance be needed in the future.  Will discharge patient from Palliative Care services at her request.     Lorenza Burton, RN

## 2021-10-24 ENCOUNTER — Inpatient Hospital Stay (HOSPITAL_BASED_OUTPATIENT_CLINIC_OR_DEPARTMENT_OTHER): Payer: Medicare Other | Admitting: Internal Medicine

## 2021-10-24 ENCOUNTER — Inpatient Hospital Stay (HOSPITAL_BASED_OUTPATIENT_CLINIC_OR_DEPARTMENT_OTHER): Payer: Medicare Other | Admitting: Hospice and Palliative Medicine

## 2021-10-24 ENCOUNTER — Other Ambulatory Visit: Payer: Self-pay

## 2021-10-24 ENCOUNTER — Inpatient Hospital Stay: Payer: Medicare Other | Attending: Hospice and Palliative Medicine

## 2021-10-24 VITALS — BP 151/65 | HR 79 | Temp 97.9°F | Resp 16 | Wt 112.5 lb

## 2021-10-24 DIAGNOSIS — Z79899 Other long term (current) drug therapy: Secondary | ICD-10-CM | POA: Insufficient documentation

## 2021-10-24 DIAGNOSIS — F0390 Unspecified dementia without behavioral disturbance: Secondary | ICD-10-CM | POA: Insufficient documentation

## 2021-10-24 DIAGNOSIS — C8330 Diffuse large B-cell lymphoma, unspecified site: Secondary | ICD-10-CM | POA: Diagnosis present

## 2021-10-24 DIAGNOSIS — C8599 Non-Hodgkin lymphoma, unspecified, extranodal and solid organ sites: Secondary | ICD-10-CM

## 2021-10-24 DIAGNOSIS — Z452 Encounter for adjustment and management of vascular access device: Secondary | ICD-10-CM | POA: Insufficient documentation

## 2021-10-24 DIAGNOSIS — Z95828 Presence of other vascular implants and grafts: Secondary | ICD-10-CM

## 2021-10-24 DIAGNOSIS — Z9221 Personal history of antineoplastic chemotherapy: Secondary | ICD-10-CM | POA: Insufficient documentation

## 2021-10-24 DIAGNOSIS — Z923 Personal history of irradiation: Secondary | ICD-10-CM | POA: Insufficient documentation

## 2021-10-24 DIAGNOSIS — I10 Essential (primary) hypertension: Secondary | ICD-10-CM | POA: Insufficient documentation

## 2021-10-24 LAB — COMPREHENSIVE METABOLIC PANEL
ALT: 13 U/L (ref 0–44)
AST: 21 U/L (ref 15–41)
Albumin: 4.5 g/dL (ref 3.5–5.0)
Alkaline Phosphatase: 124 U/L (ref 38–126)
Anion gap: 10 (ref 5–15)
BUN: 29 mg/dL — ABNORMAL HIGH (ref 8–23)
CO2: 25 mmol/L (ref 22–32)
Calcium: 9.5 mg/dL (ref 8.9–10.3)
Chloride: 99 mmol/L (ref 98–111)
Creatinine, Ser: 0.91 mg/dL (ref 0.44–1.00)
GFR, Estimated: >60 mL/min (ref >60)
Glucose, Bld: 131 mg/dL — ABNORMAL HIGH (ref 70–99)
Potassium: 3.8 mmol/L (ref 3.5–5.1)
Sodium: 134 mmol/L — ABNORMAL LOW (ref 135–145)
Total Bilirubin: <0.1 mg/dL — ABNORMAL LOW (ref 0.3–1.2)
Total Protein: 7.5 g/dL (ref 6.5–8.1)

## 2021-10-24 LAB — CBC WITH DIFFERENTIAL/PLATELET
Abs Immature Granulocytes: 0.03 10*3/uL (ref 0.00–0.07)
Basophils Absolute: 0 10*3/uL (ref 0.0–0.1)
Basophils Relative: 0 %
Eosinophils Absolute: 0.2 10*3/uL (ref 0.0–0.5)
Eosinophils Relative: 3 %
HCT: 36.9 % (ref 36.0–46.0)
Hemoglobin: 11.9 g/dL — ABNORMAL LOW (ref 12.0–15.0)
Immature Granulocytes: 0 %
Lymphocytes Relative: 21 %
Lymphs Abs: 1.6 10*3/uL (ref 0.7–4.0)
MCH: 29.3 pg (ref 26.0–34.0)
MCHC: 32.2 g/dL (ref 30.0–36.0)
MCV: 90.9 fL (ref 80.0–100.0)
Monocytes Absolute: 0.5 10*3/uL (ref 0.1–1.0)
Monocytes Relative: 7 %
Neutro Abs: 5.1 10*3/uL (ref 1.7–7.7)
Neutrophils Relative %: 69 %
Platelets: 268 10*3/uL (ref 150–400)
RBC: 4.06 MIL/uL (ref 3.87–5.11)
RDW: 13.4 % (ref 11.5–15.5)
WBC: 7.4 10*3/uL (ref 4.0–10.5)
nRBC: 0 % (ref 0.0–0.2)

## 2021-10-24 LAB — LACTATE DEHYDROGENASE: LDH: 173 U/L (ref 98–192)

## 2021-10-24 MED ORDER — HEPARIN SOD (PORK) LOCK FLUSH 100 UNIT/ML IV SOLN
500.0000 [IU] | Freq: Once | INTRAVENOUS | Status: AC
  Administered 2021-10-24: 500 [IU] via INTRAVENOUS
  Filled 2021-10-24: qty 5

## 2021-10-24 MED ORDER — SODIUM CHLORIDE 0.9% FLUSH
10.0000 mL | Freq: Once | INTRAVENOUS | Status: AC
  Administered 2021-10-24: 10 mL via INTRAVENOUS
  Filled 2021-10-24: qty 10

## 2021-10-24 NOTE — Progress Notes (Signed)
[facility] CONSULT NOTE  Patient Care Team: Derinda Late, MD as PCP - General (Family Medicine)  CHIEF COMPLAINTS/PURPOSE OF CONSULTATION: Breast lymphoma   Oncology History Overview Note  # AUG 2020- LEFT BREAST DLBCL [non-GCB subtype; Double expressor;NEG- CD-30; Ki-67-80-90%]; FISH -negative for translocations; SEP 2020- PET-left breast intense uptake; no distant disease noted.  Hold off bone marrow biopsy [patient/family preference not to be aggressive]; Stage adjusted IPI-"2 points" cure rate 50-60%.   # 9/21- Pred+Rituxan; OCT 2020- mini-R-CHOP; s/p radiation [feb 19th, 2021] -------------------------------------------------------------------------------------------- # 2006- DLBCL of Breast [s? 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    ;GOALS: Cure  CURRENT/MOST RECENT THERAPY:mini-RCHOP    Lymphoma of breast ([place])  05/25/2019 Initial Diagnosis   Lymphoma of breast ([place])   06/14/2019 -  Chemotherapy   The patient had DOXOrubicin (ADRIAMYCIN) chemo injection 36 mg, 25 mg/m2 = 36 mg (100 % of original dose 25 mg/m2), Intravenous,  Once, 4 of 4 cycles Dose modification: 25 mg/m2 (original dose 25 mg/m2, Cycle 2, Reason: Provider Judgment) Administration: 36 mg (07/05/2019), 36 mg (08/16/2019), 36 mg (07/26/2019), 36 mg (09/06/2019) palonosetron (ALOXI) injection 0.25 mg, 0.25 mg, Intravenous,  Once, 4 of 4 cycles Administration: 0.25 mg (07/05/2019), 0.25 mg (08/16/2019), 0.25 mg (07/26/2019), 0.25 mg (09/06/2019) pegfilgrastim-jmdb (FULPHILA) injection 6 mg, 6 mg, Subcutaneous,  Once, 4 of 4 cycles Administration: 6 mg (07/06/2019), 6 mg (08/17/2019), 6 mg (07/27/2019), 6 mg (09/07/2019) vinCRIStine (ONCOVIN) 2 mg in sodium chloride 0.9 % 50 mL chemo infusion, 2 mg, Intravenous,  Once, 4 of 4 cycles Administration: 2 mg  (07/05/2019), 2 mg (08/16/2019), 2 mg (07/26/2019), 2 mg (09/06/2019) riTUXimab (RITUXAN) 500 mg in sodium chloride 0.9 % 250 mL (1.6667 mg/mL) infusion, 375 mg/m2 = 500 mg, Intravenous,  Once, 1 of 1 cycle Administration: 500 mg (06/14/2019) cyclophosphamide (CYTOXAN) 580 mg in sodium chloride 0.9 % 250 mL chemo infusion, 400 mg/m2 = 580 mg (100 % of original dose 400 mg/m2), Intravenous,  Once, 4 of 4 cycles Dose modification: 600 mg/m2 (original dose 400 mg/m2, Cycle 2, Reason: Provider Judgment), 400 mg/m2 (original dose 400 mg/m2, Cycle 2, Reason: Provider Judgment) Administration: 580 mg (07/05/2019), 580 mg (08/16/2019), 580 mg (07/26/2019), 580 mg (09/06/2019) fosaprepitant (EMEND) 150 mg, dexamethasone (DECADRON) 12 mg in sodium chloride 0.9 % 145 mL IVPB, , Intravenous,  Once, 4 of 4 cycles Administration:  (07/05/2019),  (08/16/2019),  (07/26/2019),  (09/06/2019) riTUXimab-pvvr (RUXIENCE) 500 mg in sodium chloride 0.9 % 250 mL (1.6667 mg/mL) infusion, 375 mg/m2 = 500 mg (100 % of original dose 375 mg/m2), Intravenous,  Once, 1 of 1 cycle Dose modification: 375 mg/m2 (original dose 375 mg/m2, Cycle 2, Reason: Other (see comments), Comment: insurance)   for chemotherapy treatment.        HISTORY OF PRESENTING ILLNESS: Ambulating independently./Alone.  Stephanie Davidson 88 y.o.  female diffuse large B-cell lymphoma of the left breast stage I E/recurrent currently on mini R-CHOP-is here for follow-up.  Patient continues to have chronic fullness in the left upper breast; this has not gotten any bigger.  March 2022 patient had a mammogram/ultrasound negative for any masses.  Denies any new lumps or bumps.  No headaches.  No nausea vomiting.  No fevers no chills.  Review of Systems  Constitutional:  Negative for chills, diaphoresis, fever and malaise/fatigue.  HENT:  Negative  for nosebleeds and sore throat.   Eyes:  Negative for double vision.  Respiratory:  Negative for cough, hemoptysis,  sputum production, shortness of breath and wheezing.   Cardiovascular:  Negative for chest pain, palpitations, orthopnea and leg swelling.  Gastrointestinal:  Negative for abdominal pain, blood in stool, constipation, diarrhea, heartburn, melena, nausea and vomiting.  Genitourinary:  Negative for dysuria, frequency and urgency.  Musculoskeletal:  Positive for back pain and joint pain.  Skin: Negative.  Negative for itching and rash.  Neurological:  Negative for dizziness, tingling, focal weakness, weakness and headaches.  Endo/Heme/Allergies:  Does not bruise/bleed easily.  Psychiatric/Behavioral:  Positive for memory loss. Negative for depression. The patient is not nervous/anxious and does not have insomnia.     MEDICAL HISTORY:  Past Medical History:  Diagnosis Date   Breast cancer ([place])    Hypertension    Personal history of chemotherapy    Personal history of radiation therapy     SURGICAL HISTORY: Past Surgical History:  Procedure Laterality Date   BREAST BIOPSY Right ?   needle bx-benign   BREAST BIOPSY Left 05/12/2019   [place] bx 12:00, venus marker, DIFFUSE LARGE B CELL LYMPHOMA   BREAST EXCISIONAL BIOPSY Left 2003   lymphoma   BREAST EXCISIONAL BIOPSY Left ?   lump was removed, benign   IR IMAGING GUIDED PORT INSERTION  06/02/2019    SOCIAL HISTORY: Social History   Socioeconomic History   Marital status: Widowed    Spouse name: Not on file   Number of children: 2   Years of education: Not on file   Highest education level: Not on file  Occupational History   Not on file  Tobacco Use   Smoking status: Never   Smokeless tobacco: Never  Vaping Use   Vaping Use: Never used  Substance and Sexual Activity   Alcohol use: No   Drug use: Not on file   Sexual activity: Not on file  Other Topics Concern   Not on file  Social History Narrative   [place] [place]; own apartment; no smoking; no alcohol. 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    Social Determinants of Health   Financial Resource  Strain: Not on file  Food Insecurity: Not on file  Transportation Needs: Not on file  Physical Activity: Not on file  Stress: Not on file  Social Connections: Not on file  Intimate Partner Violence: Not on file    FAMILY HISTORY: Family History  Problem Relation Age of Onset   Breast cancer Neg Hx     ALLERGIES:  is allergic to latex.  MEDICATIONS:  Current Outpatient Medications  Medication Sig Dispense Refill   amLODipine (NORVASC) 10 MG tablet Take 1 tablet by mouth daily.     donepezil (ARICEPT) 10 MG tablet Take 1 tablet by mouth at bedtime.     fluticasone (FLONASE) 50 MCG/ACT nasal spray Place 2 sprays into both nostrils daily.  (Patient not taking: Reported on 04/04/2021)     hydrochlorothiazide (HYDRODIURIL) 25 MG tablet Take 1 tablet by mouth daily.     irbesartan (AVAPRO) 300 MG tablet Take 1 tablet by mouth daily.     lidocaine-prilocaine (EMLA) cream Apply to affected area once (Patient not taking: Reported on 04/04/2021) 30 g 3   melatonin 1 MG TABS tablet Take by mouth.     meloxicam (MOBIC) 15 MG tablet Take 1 tablet by mouth as needed.      meloxicam (MOBIC) 15 MG tablet Take 1 tablet by mouth daily.  Multiple Vitamin (MULTI-VITAMIN) tablet Take 1 tablet by mouth daily. (Patient not taking: Reported on 04/04/2021)     traZODone (DESYREL) 50 MG tablet Take 1 tablet by mouth at bedtime. (Patient not taking: Reported on 10/24/2021)     No current facility-administered medications for this visit.   Facility-Administered Medications Ordered in Other Visits  Medication Dose Route Frequency Provider Last Rate Last Admin   heparin lock flush 100 unit/mL  500 Units Intravenous Once Faythe Casa E, NP       sodium chloride flush (NS) 0.9 % injection 10 mL  10 mL Intravenous PRN Jacquelin Hawking, NP          .  PHYSICAL EXAMINATION: ECOG PERFORMANCE STATUS: 1 - Symptomatic but completely ambulatory  There were no vitals filed for this visit.  There were no  vitals filed for this visit.   Physical Exam Constitutional:      Comments: Accompanied by her daughter.  She is walking herself.  HENT:     Head: Normocephalic and atraumatic.     Mouth/Throat:     Pharynx: No oropharyngeal exudate.  Eyes:     Pupils: Pupils are equal, round, and reactive to light.  Cardiovascular:     Rate and Rhythm: Normal rate and regular rhythm.  Pulmonary:     Effort: Pulmonary effort is normal. No respiratory distress.     Breath sounds: Normal breath sounds. No wheezing.  Abdominal:     General: Bowel sounds are normal. There is no distension.     Palpations: Abdomen is soft. There is no mass.     Tenderness: There is no abdominal tenderness. There is no guarding or rebound.  Musculoskeletal:        General: No tenderness. Normal range of motion.     Cervical back: Normal range of motion and neck supple.  Skin:    General: Skin is warm.     Comments: Left breast upper inner quadrant-no obvious masses noted.  Question scar tissue.  Tenderness noted no skin changes noted.  No nipple changes noted.  Neurological:     Mental Status: She is alert and oriented to person, place, and time.  Psychiatric:        Mood and Affect: Affect normal.     LABORATORY DATA:  I have reviewed the data as listed Lab Results  Component Value Date   WBC 7.4 10/24/2021   HGB 11.9 (L) 10/24/2021   HCT 36.9 10/24/2021   MCV 90.9 10/24/2021   PLT 268 10/24/2021   Recent Labs    11/30/20 0930 04/04/21 1504 10/24/21 1406  NA 137 133* 134*  K 4.9 4.8 3.8  CL 101 98 99  CO2 _0 GLUCOSE 98 102* 131*  BUN 28* 29* 29*  CREATININE 0.80 0.86 0.91  CALCIUM 9.8 9.3 9.5  GFRNONAA >60 >60 >60  PROT 7.1 7.2 7.5  ALBUMIN 4.2 4.5 4.5  AST _1 ALT _2 ALKPHOS 116 118 124  BILITOT 0.8 0.5 <0.1*    RADIOGRAPHIC STUDIES: I have personally reviewed the radiological images as listed and agreed with the findings in the report. No results  found.  ASSESSMENT & PLAN:   Lymphoma of breast ([place]) # Diffuse large B-cell lymphoma left breast Non-GCB subtype; double expresser. Stage I E. S/p 4- R-mini CHOP; s/p consolidation radiation [finished Feb 19th 2021]. AUG 2021- PET scan-complete response noted in the left breast; slight uptake noted in the mediastinal lymph nodes/clinically  reactive.   March 2022-ultrasound/mammogram negative for any recurrent disease.  Clinically stable.  No worsening masses noted.  Monitor for now.  # Dementia: Stable continues to be fairly independent.  # port/IV access-stable.  Port flush today.  Discussed explantation; discussed pro and cons- pt's preference to keep it.  # DISPOSITION: # port flush today # port flush in 3 months # follow up in 71mohd- MD; port flush- labs- cbc/cmp/LDH-Dr.B  Cc; Dr.Baboff   All questions were answered. The patient knows to call the clinic with any problems, questions or concerns.    GCammie Sickle MD 10/24/2021 4:35 PM

## 2021-10-24 NOTE — Progress Notes (Signed)
Pt here for 53mo f/u for lymphoma of breast. She reports no changes in her health, and states that she actually feels better than she has in the past. She is thankful that she has been able to engage in more activities at [facility] since they have lessened restrictions.

## 2021-10-24 NOTE — Progress Notes (Signed)
[facility]  Telephone:([phone] [phone]   Name: Stephanie Davidson Date: 10/24/2021 MRN: [id number]  DOB: 01/26/1931  Patient Care Team: Derinda Late, MD as PCP - General (Family Medicine)    REASON FOR CONSULTATION: Palliative Care consult requested for this 88 y.o. female with multiple medical problems including history of lymphoma status post R-CHOP chemotherapy and radiation with recurrent diffuse lymphoma of the left breast.  Patient had severe symptoms from previous treatment and was initially reluctant to pursue further cancer treatment.  She agreed with mini-RCHOP and has subsequently done well. Patient was referred to palliative care to help address goals and provide with support.  SOCIAL HISTORY:     reports that she has never smoked. She has never used smokeless tobacco. She reports that she does not drink alcohol.   Patient is widowed.  She lives at [facility] in independent living.  She has a son and [place] and a daughter in [place].  ADVANCE DIRECTIVES:  Not on file.  Her son, [name], is her healthcare power of attorney and her daughter, [name], is her financial POA.  Patient reportedly has a living will.  CODE STATUS:   PAST MEDICAL HISTORY: Past Medical History:  Diagnosis Date   Breast cancer ([place])    Hypertension    Personal history of chemotherapy    Personal history of radiation therapy     PAST SURGICAL HISTORY:  Past Surgical History:  Procedure Laterality Date   BREAST BIOPSY Right ?   needle bx-benign   BREAST BIOPSY Left 05/12/2019   [place] bx 12:00, venus marker, DIFFUSE LARGE B CELL LYMPHOMA   BREAST EXCISIONAL BIOPSY Left 2003   lymphoma   BREAST EXCISIONAL BIOPSY Left ?   lump was removed, benign   IR IMAGING GUIDED PORT INSERTION  06/02/2019    HEMATOLOGY/ONCOLOGY HISTORY:  Oncology History Overview Note  # AUG 2020- LEFT BREAST DLBCL [non-GCB subtype; Double expressor;NEG-  CD-30; Ki-67-80-90%]; FISH -negative for translocations; SEP 2020- PET-left breast intense uptake; no distant disease noted.  Hold off bone marrow biopsy [patient/family preference not to be aggressive]; Stage adjusted IPI-"2 points" cure rate 50-60%.   # 9/21- Pred+Rituxan; OCT 2020- mini-R-CHOP; s/p radiation [feb 19th, 2021] -------------------------------------------------------------------------------------------- # 2006- DLBCL of Breast [s? 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    ;GOALS: Cure  CURRENT/MOST RECENT THERAPY:mini-RCHOP    Lymphoma of breast ([place])  05/25/2019 Initial Diagnosis   Lymphoma of breast ([place])   06/14/2019 -  Chemotherapy   The patient had DOXOrubicin (ADRIAMYCIN) chemo injection 36 mg, 25 mg/m2 = 36 mg (100 % of original dose 25 mg/m2), Intravenous,  Once, 4 of 4 cycles Dose modification: 25 mg/m2 (original dose 25 mg/m2, Cycle 2, Reason: Provider Judgment) Administration: 36 mg (07/05/2019), 36 mg (08/16/2019), 36 mg (07/26/2019), 36 mg (09/06/2019) palonosetron (ALOXI) injection 0.25 mg, 0.25 mg, Intravenous,  Once, 4 of 4 cycles Administration: 0.25 mg (07/05/2019), 0.25 mg (08/16/2019), 0.25 mg (07/26/2019), 0.25 mg (09/06/2019) pegfilgrastim-jmdb (FULPHILA) injection 6 mg, 6 mg, Subcutaneous,  Once, 4 of 4 cycles Administration: 6 mg (07/06/2019), 6 mg (08/17/2019), 6 mg (07/27/2019), 6 mg (09/07/2019) vinCRIStine (ONCOVIN) 2 mg in sodium chloride 0.9 % 50 mL chemo infusion, 2 mg, Intravenous,  Once, 4 of 4 cycles Administration: 2 mg (07/05/2019), 2 mg (08/16/2019), 2 mg (07/26/2019),  2 mg (09/06/2019) riTUXimab (RITUXAN) 500 mg in sodium chloride 0.9 % 250 mL (1.6667 mg/mL) infusion, 375 mg/m2 = 500 mg, Intravenous,  Once, 1 of 1 cycle Administration: 500 mg (06/14/2019) cyclophosphamide (CYTOXAN) 580 mg in sodium chloride  0.9 % 250 mL chemo infusion, 400 mg/m2 = 580 mg (100 % of original dose 400 mg/m2), Intravenous,  Once, 4 of 4 cycles Dose modification: 600 mg/m2 (original dose 400 mg/m2, Cycle 2, Reason: Provider Judgment), 400 mg/m2 (original dose 400 mg/m2, Cycle 2, Reason: Provider Judgment) Administration: 580 mg (07/05/2019), 580 mg (08/16/2019), 580 mg (07/26/2019), 580 mg (09/06/2019) fosaprepitant (EMEND) 150 mg, dexamethasone (DECADRON) 12 mg in sodium chloride 0.9 % 145 mL IVPB, , Intravenous,  Once, 4 of 4 cycles Administration:  (07/05/2019),  (08/16/2019),  (07/26/2019),  (09/06/2019) riTUXimab-pvvr (RUXIENCE) 500 mg in sodium chloride 0.9 % 250 mL (1.6667 mg/mL) infusion, 375 mg/m2 = 500 mg (100 % of original dose 375 mg/m2), Intravenous,  Once, 1 of 1 cycle Dose modification: 375 mg/m2 (original dose 375 mg/m2, Cycle 2, Reason: Other (see comments), Comment: insurance)  for chemotherapy treatment.      ALLERGIES:  is allergic to latex.  MEDICATIONS:  Current Outpatient Medications  Medication Sig Dispense Refill   amLODipine (NORVASC) 10 MG tablet Take 1 tablet by mouth daily.     donepezil (ARICEPT) 10 MG tablet Take 1 tablet by mouth at bedtime.     hydrochlorothiazide (HYDRODIURIL) 25 MG tablet Take 1 tablet by mouth daily.     irbesartan (AVAPRO) 300 MG tablet Take 1 tablet by mouth daily.     melatonin 1 MG TABS tablet Take by mouth.     meloxicam (MOBIC) 15 MG tablet Take 1 tablet by mouth daily.     fluticasone (FLONASE) 50 MCG/ACT nasal spray Place 2 sprays into both nostrils daily.  (Patient not taking: Reported on 04/04/2021)     lidocaine-prilocaine (EMLA) cream Apply to affected area once (Patient not taking: Reported on 04/04/2021) 30 g 3   meloxicam (MOBIC) 15 MG tablet Take 1 tablet by mouth as needed.      Multiple Vitamin (MULTI-VITAMIN) tablet Take 1 tablet by mouth daily. (Patient not taking: Reported on 04/04/2021)     traZODone (DESYREL) 50 MG tablet Take 1 tablet by mouth  at bedtime. (Patient not taking: Reported on 10/24/2021)     No current facility-administered medications for this visit.   Facility-Administered Medications Ordered in Other Visits  Medication Dose Route Frequency Provider Last Rate Last Admin   heparin lock flush 100 unit/mL  500 Units Intravenous Once Faythe Casa E, NP       sodium chloride flush (NS) 0.9 % injection 10 mL  10 mL Intravenous PRN Jacquelin Hawking, NP        VITAL SIGNS: BP (!) 151/65    Pulse 79    Temp 97.9 F (36.6 C) (Tympanic)    Resp 16    Wt 112 lb 8 oz (51 kg)    SpO2 99%    BMI 21.26 kg/m  Filed Weights   10/24/21 1448  Weight: 112 lb 8 oz (51 kg)    Estimated body mass index is 21.26 kg/m as calculated from the following:   Height as of 06/22/20: '5\' 1"'  (1.549 m).   Weight as of this encounter: 112 lb 8 oz (51 kg).  LABS: CBC:    Component Value Date/Time   WBC 7.4 10/24/2021 1406   HGB 11.9 (L) 10/24/2021 1406   HGB 14.1  10/28/2012 0950   HCT 36.9 10/24/2021 1406   HCT 41.4 10/28/2012 0950   PLT 268 10/24/2021 1406   PLT 200 10/28/2012 0950   MCV 90.9 10/24/2021 1406   MCV 92 10/28/2012 0950   NEUTROABS 5.1 10/24/2021 1406   NEUTROABS 3.8 10/28/2012 0950   LYMPHSABS 1.6 10/24/2021 1406   LYMPHSABS 1.4 10/28/2012 0950   MONOABS 0.5 10/24/2021 1406   MONOABS 0.4 10/28/2012 0950   EOSABS 0.2 10/24/2021 1406   EOSABS 0.2 10/28/2012 0950   BASOSABS 0.0 10/24/2021 1406   BASOSABS 0.0 10/28/2012 0950   Comprehensive Metabolic Panel:    Component Value Date/Time   NA 133 (L) 04/04/2021 1504   NA 139 10/28/2012 0950   K 4.8 04/04/2021 1504   K 3.4 (L) 02/18/2013 1508   CL 98 04/04/2021 1504   CL 98 10/28/2012 0950   CO2 27 04/04/2021 1504   CO2 33 (H) 10/28/2012 0950   BUN 29 (H) 04/04/2021 1504   BUN 16 10/28/2012 0950   CREATININE 0.86 04/04/2021 1504   CREATININE 0.74 10/28/2012 0950   GLUCOSE 102 (H) 04/04/2021 1504   GLUCOSE 105 (H) 10/28/2012 0950   CALCIUM 9.3 04/04/2021 1504    CALCIUM 10.0 10/28/2012 0950   AST 20 04/04/2021 1504   AST 26 10/28/2012 0950   ALT 14 04/04/2021 1504   ALT 28 10/28/2012 0950   ALKPHOS 118 04/04/2021 1504   ALKPHOS 142 (H) 10/28/2012 0950   BILITOT 0.5 04/04/2021 1504   BILITOT 0.4 10/28/2012 0950   PROT 7.2 04/04/2021 1504   PROT 8.2 10/28/2012 0950   ALBUMIN 4.5 04/04/2021 1504   ALBUMIN 4.0 10/28/2012 0950    RADIOGRAPHIC STUDIES: No results found.  PERFORMANCE STATUS (ECOG) : 0 - Asymptomatic  Review of Systems Unless otherwise noted, a complete review of systems is negative.  Physical Exam General: NAD, frail appearing, thin Pulmonary: Unlabored Extremities: no edema, no joint deformities Skin: no rashes Neurological: Weakness but otherwise nonfocal  IMPRESSION: Routine follow-up visit today.    Patient reports that she is doing well.  She denies any significant changes or concerns.  No symptomatic complaints at present.  She reports good appetite and has stable weight.  She tries to walk at least half a mile every day and participates in exercise classes at [facility]  No issues reported with medications.  She was having some confusion with pill bottles but this is resolved with a change in packaging from pharmacy.  PLAN: -Continue current scope of treatment -RTC as needed   Patient expressed understanding and was in agreement with this plan. She also understands that She can call the clinic at any time with any questions, concerns, or complaints.     Time Total: 10 minutes  Visit consisted of counseling and education dealing with the complex and emotionally intense issues of symptom management and palliative care in the setting of serious and potentially life-threatening illness.Greater than 50%  of this time was spent counseling and coordinating care related to the above assessment and plan.  Signed by: Altha Harm, PhD, NP-C [phone] (Work Cell)

## 2021-10-24 NOTE — Assessment & Plan Note (Addendum)
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 Stage I E. S/p 4- R-mini CHOP; s/p consolidation radiation [finished Feb 19th 2021]. AUG 2021- PET scan-complete response noted in the left breast; slight uptake noted in the mediastinal lymph nodes/clinically reactive.   March 2022-ultrasound/mammogram negative for any recurrent disease.  Clinically stable.  No worsening masses noted.  Monitor for now.  # Dementia: Stable continues to be fairly independent.  # port/IV access-stable.  Port flush today.  Discussed explantation; discussed pro and cons- pt's preference to keep it.  # DISPOSITION: # port flush today # port flush in 3 months # follow up in 49mothd- MD; port flush- labs- cbc/cmp/LDH-Dr.B  Cc; Dr.Baboff

## 2022-01-06 ENCOUNTER — Emergency Department: Payer: Medicare Other

## 2022-01-06 ENCOUNTER — Inpatient Hospital Stay
Admission: EM | Admit: 2022-01-06 | Discharge: 2022-01-09 | DRG: 522 | Disposition: A | Discharge status: Skilled Nursing Facility | Payer: Medicare Other | Attending: Internal Medicine | Admitting: Internal Medicine

## 2022-01-06 ENCOUNTER — Other Ambulatory Visit: Payer: Self-pay

## 2022-01-06 DIAGNOSIS — Z91040 Latex allergy status: Secondary | ICD-10-CM

## 2022-01-06 DIAGNOSIS — F0390 Unspecified dementia without behavioral disturbance: Secondary | ICD-10-CM | POA: Diagnosis present

## 2022-01-06 DIAGNOSIS — Z79891 Long term (current) use of opiate analgesic: Secondary | ICD-10-CM | POA: Diagnosis not present

## 2022-01-06 DIAGNOSIS — Z9221 Personal history of antineoplastic chemotherapy: Secondary | ICD-10-CM

## 2022-01-06 DIAGNOSIS — W010XXA Fall on same level from slipping, tripping and stumbling without subsequent striking against object, initial encounter: Secondary | ICD-10-CM | POA: Diagnosis present

## 2022-01-06 DIAGNOSIS — S72002A Fracture of unspecified part of neck of left femur, initial encounter for closed fracture: Secondary | ICD-10-CM | POA: Diagnosis present

## 2022-01-06 DIAGNOSIS — Z923 Personal history of irradiation: Secondary | ICD-10-CM | POA: Diagnosis not present

## 2022-01-06 DIAGNOSIS — Z853 Personal history of malignant neoplasm of breast: Secondary | ICD-10-CM | POA: Diagnosis not present

## 2022-01-06 DIAGNOSIS — D62 Acute posthemorrhagic anemia: Secondary | ICD-10-CM | POA: Diagnosis not present

## 2022-01-06 DIAGNOSIS — I447 Left bundle-branch block, unspecified: Secondary | ICD-10-CM | POA: Diagnosis present

## 2022-01-06 DIAGNOSIS — Z79899 Other long term (current) drug therapy: Secondary | ICD-10-CM | POA: Diagnosis not present

## 2022-01-06 DIAGNOSIS — I10 Essential (primary) hypertension: Secondary | ICD-10-CM | POA: Diagnosis present

## 2022-01-06 DIAGNOSIS — Y92000 Kitchen of unspecified non-institutional (private) residence as  the place of occurrence of the external cause: Secondary | ICD-10-CM | POA: Diagnosis not present

## 2022-01-06 DIAGNOSIS — C8599 Non-Hodgkin lymphoma, unspecified, extranodal and solid organ sites: Secondary | ICD-10-CM | POA: Diagnosis present

## 2022-01-06 LAB — BASIC METABOLIC PANEL
Anion gap: 16 — ABNORMAL HIGH (ref 5–15)
BUN: 31 mg/dL — ABNORMAL HIGH (ref 8–23)
CO2: 19 mmol/L — ABNORMAL LOW (ref 22–32)
Calcium: 9.3 mg/dL (ref 8.9–10.3)
Chloride: 99 mmol/L (ref 98–111)
Creatinine, Ser: 1.13 mg/dL — ABNORMAL HIGH (ref 0.44–1.00)
GFR, Estimated: 47 mL/min — ABNORMAL LOW (ref >60)
Glucose, Bld: 178 mg/dL — ABNORMAL HIGH (ref 70–99)
Potassium: 5.1 mmol/L (ref 3.5–5.1)
Sodium: 134 mmol/L — ABNORMAL LOW (ref 135–145)

## 2022-01-06 LAB — CBC
HCT: 38.7 % (ref 36.0–46.0)
Hemoglobin: 12.1 g/dL (ref 12.0–15.0)
MCH: 29.1 pg (ref 26.0–34.0)
MCHC: 31.3 g/dL (ref 30.0–36.0)
MCV: 93 fL (ref 80.0–100.0)
Platelets: 249 10*3/uL (ref 150–400)
RBC: 4.16 MIL/uL (ref 3.87–5.11)
RDW: 14.6 % (ref 11.5–15.5)
WBC: 8.9 10*3/uL (ref 4.0–10.5)
nRBC: 0 % (ref 0.0–0.2)

## 2022-01-06 LAB — PROTIME-INR
INR: 1 (ref 0.8–1.2)
Prothrombin Time: 12.9 seconds (ref 11.4–15.2)

## 2022-01-06 MED ORDER — LIDOCAINE 5 % EX PTCH
1.0000 | MEDICATED_PATCH | CUTANEOUS | Status: DC
  Administered 2022-01-06 – 2022-01-08 (×3): 1 via TRANSDERMAL
  Filled 2022-01-06 (×3): qty 1

## 2022-01-06 MED ORDER — SENNA 8.6 MG PO TABS
1.0000 | ORAL_TABLET | Freq: Two times a day (BID) | ORAL | Status: DC
  Administered 2022-01-06 – 2022-01-09 (×6): 8.6 mg via ORAL
  Filled 2022-01-06 (×6): qty 1

## 2022-01-06 MED ORDER — MORPHINE SULFATE (PF) 4 MG/ML IV SOLN
4.0000 mg | Freq: Once | INTRAVENOUS | Status: AC
  Administered 2022-01-06: 4 mg via INTRAVENOUS
  Filled 2022-01-06: qty 1

## 2022-01-06 MED ORDER — MORPHINE SULFATE (PF) 4 MG/ML IV SOLN: 4.0000 mg | Freq: Once | INTRAVENOUS | Status: DC

## 2022-01-06 MED ORDER — MORPHINE SULFATE (PF) 2 MG/ML IV SOLN
0.5000 mg | INTRAVENOUS | Status: DC | PRN
  Administered 2022-01-06 – 2022-01-07 (×3): 0.5 mg via INTRAVENOUS
  Filled 2022-01-06 (×3): qty 1

## 2022-01-06 MED ORDER — MELATONIN 5 MG PO TABS: 2.5000 mg | ORAL_TABLET | Freq: Every evening | ORAL | Status: DC | PRN

## 2022-01-06 MED ORDER — ONDANSETRON HCL 4 MG/2ML IJ SOLN
4.0000 mg | Freq: Once | INTRAMUSCULAR | Status: AC
  Administered 2022-01-06: 4 mg via INTRAVENOUS
  Filled 2022-01-06: qty 2

## 2022-01-06 MED ORDER — SODIUM CHLORIDE 0.9 % IV BOLUS
500.0000 mL | Freq: Once | INTRAVENOUS | Status: AC
  Administered 2022-01-06: 500 mL via INTRAVENOUS

## 2022-01-06 MED ORDER — METHOCARBAMOL 1000 MG/10ML IJ SOLN
500.0000 mg | Freq: Four times a day (QID) | INTRAVENOUS | Status: DC | PRN
  Filled 2022-01-06: qty 5

## 2022-01-06 MED ORDER — METHOCARBAMOL 500 MG PO TABS
500.0000 mg | ORAL_TABLET | Freq: Four times a day (QID) | ORAL | Status: DC | PRN
  Administered 2022-01-06 – 2022-01-07 (×2): 500 mg via ORAL
  Filled 2022-01-06 (×3): qty 1

## 2022-01-06 MED ORDER — DONEPEZIL HCL 5 MG PO TABS
10.0000 mg | ORAL_TABLET | Freq: Every day | ORAL | Status: DC
  Administered 2022-01-06 – 2022-01-08 (×3): 10 mg via ORAL
  Filled 2022-01-06 (×3): qty 2

## 2022-01-06 MED ORDER — HYDROCHLOROTHIAZIDE 25 MG PO TABS
25.0000 mg | ORAL_TABLET | Freq: Every day | ORAL | Status: DC
  Administered 2022-01-08 – 2022-01-09 (×2): 25 mg via ORAL
  Filled 2022-01-06 (×2): qty 1

## 2022-01-06 MED ORDER — MORPHINE SULFATE (PF) 2 MG/ML IV SOLN
0.5000 mg | Freq: Once | INTRAVENOUS | Status: DC
  Filled 2022-01-06: qty 1

## 2022-01-06 MED ORDER — CEFAZOLIN SODIUM-DEXTROSE 2-4 GM/100ML-% IV SOLN
2.0000 g | Freq: Once | INTRAVENOUS | Status: AC
Start: 2022-01-06 — End: 2022-01-06
  Administered 2022-01-06: 2 g via INTRAVENOUS
  Filled 2022-01-06: qty 100

## 2022-01-06 MED ORDER — AMLODIPINE BESYLATE 10 MG PO TABS
10.0000 mg | ORAL_TABLET | Freq: Every day | ORAL | Status: DC
  Administered 2022-01-06 – 2022-01-09 (×4): 10 mg via ORAL
  Filled 2022-01-06: qty 1
  Filled 2022-01-06: qty 2
  Filled 2022-01-06: qty 1
  Filled 2022-01-06: qty 2

## 2022-01-06 MED ORDER — MORPHINE SULFATE (PF) 2 MG/ML IV SOLN
2.0000 mg | Freq: Once | INTRAVENOUS | Status: AC
  Administered 2022-01-06: 2 mg via INTRAVENOUS
  Filled 2022-01-06: qty 1

## 2022-01-06 MED ORDER — HYDROCODONE-ACETAMINOPHEN 5-325 MG PO TABS
1.0000 | ORAL_TABLET | Freq: Four times a day (QID) | ORAL | Status: DC | PRN
  Administered 2022-01-06: 2 via ORAL
  Administered 2022-01-07: 1 via ORAL
  Filled 2022-01-06: qty 2
  Filled 2022-01-06: qty 1

## 2022-01-06 MED ORDER — IRBESARTAN 150 MG PO TABS
300.0000 mg | ORAL_TABLET | Freq: Every day | ORAL | Status: DC
  Administered 2022-01-08 – 2022-01-09 (×2): 300 mg via ORAL
  Filled 2022-01-06 (×2): qty 2

## 2022-01-06 NOTE — ED Provider Notes (Signed)
? ?[facility] ?Provider Note ? ? ? Event Date/Time  ? First MD Initiated Contact with Patient 01/06/22 1403   ?  (approximate) ? ? ?History  ? ?Fall ? ? ?HPI ? ?Stephanie Davidson is a 88 y.o. female with a history of breast cancer and hypertension dementia ? ?Patient at [facility].  She was in her kitchen, turned and did not realize she had one of her drawers pulled out stumbled over it and fell onto her left buttock and suddenly had pain in her left hip.  She has had a previous hip fracture and surgery on her right hip and she reports similar pain on the left hip area. ? ?Pain is moderate to severe with movement.  Located over the left hip.  Denies any other injury.  Does not take any blood thinners.  She did not strike her head.  She recalls the entire event.  Reports she is upset with herself that she left the door out and stumbled over it. ? ?Normal sensation in the left foot.  Pain with movement in the left hip only ? ? ?Physical Exam  ? ?Triage Vital Signs: ?ED Triage Vitals  ?Enc Vitals Group  ?   BP 01/06/22 1404 (!) 154/66  ?   Pulse Rate 01/06/22 1404 94  ?   Resp 01/06/22 1404 19  ?   Temp 01/06/22 1407 97.9 ?F (36.6 ?C)  ?   Temp Source 01/06/22 1407 Oral  ?   SpO2 01/06/22 1404 99 %  ?   Weight 01/06/22 1405 108 lb (49 kg)  ?   Height 01/06/22 1405 5' (1.524 m)  ?   Head Circumference --   ?   Peak Flow --   ?   Pain Score 01/06/22 1405 10  ?   Pain Loc --   ?   Pain Edu? --   ?   Excl. in [place]? --   ? ? ?Most recent vital signs: ?Vitals:  ? 01/06/22 1404 01/06/22 1407  ?BP: (!) 154/66   ?Pulse: 94   ?Resp: 19   ?Temp:  97.9 ?F (36.6 ?C)  ?SpO2: 99%   ? ? ? ?General: Awake, no distress.  ?CV:  Good peripheral perfusion.  Normal heart tones ?Resp:  Normal effort.  Clear lungs bilateral ?Abd:  No distention.  Soft nontender.  No pain across the lower pelvic region ?Other:  Left lower extremity obvious pain over the left lateral hip.  No open fracture or bleeding.  She does not have any  pain or discomfort to palpation of the distal to mid femur to the level of the toes.  No other joint involvement.  Demonstrates normal sensation in the left foot.  Strong palpable dorsalis pedis pulse and normal cap refill of the left foot.  Right lower extremity good range of motion good use of it atraumatic in appearance.  No injuries noted to the upper extremities ? ? ?ED Results / Procedures / Treatments  ? ?Labs ?(all labs ordered are listed, but only abnormal results are displayed) ?Labs Reviewed  ?BASIC METABOLIC PANEL - Abnormal; Notable for the following components:  ?    Result Value  ? Sodium 134 (*)   ? CO2 19 (*)   ? Glucose, Bld 178 (*)   ? BUN 31 (*)   ? Creatinine, Ser 1.13 (*)   ? GFR, Estimated 47 (*)   ? Anion gap 16 (*)   ? All other components within normal limits  ?  CBC  ?PROTIME-INR  ? ? ? ?EKG ? ?Reviewed inter by me at 1410 ?Heart rate 95 ?QRS 130 ?QTc 450 ?Left bundle branch block.  Sinus rhythm. ? ? ?RADIOLOGY ? ?DG Chest 1 View ? ?Result Date: 01/06/2022 ?CLINICAL DATA:  Difficulty breathing EXAM: CHEST  1 VIEW COMPARISON:  01/28/2011 FINDINGS: Transverse diameter of heart is within normal limits. There are new linear densities in the left parahilar region. Rest of the lung fields are essentially clear. There is no pleural effusion or pneumothorax. There is calcified granulomas in the left upper lung fields and left hilum with no significant change. Tip of right IJ chest port is seen in the superior vena cava. IMPRESSION: Linear densities in the left parahilar region suggest scarring or subsegmental atelectasis. There are no signs of alveolar pulmonary edema or focal pulmonary consolidation. Electronically Signed   By: Elmer Picker M.D.   On: 01/06/2022 15:02  ? ?DG Hip Unilat W or Wo Pelvis 2-3 Views Left ? ?Result Date: 01/06/2022 ?CLINICAL DATA:  Trauma, fall EXAM: DG HIP (WITH OR WITHOUT PELVIS) 2-3V LEFT COMPARISON:  None. FINDINGS: There is displaced fracture in the neck of left  femur. There is superior displacement of distal major fracture fragment. There is varus deformity at the fracture site. There is previous internal fixation in the proximal right femur. Degenerative changes are noted in the visualized lower lumbar spine. IMPRESSION: Displaced fracture is seen in the neck of proximal left femur. Electronically Signed   By: Elmer Picker M.D.   On: 01/06/2022 14:59   ? ? ? ? ?PROCEDURES: ? ?Critical Care performed: No ? ?Procedures ? ? ?MEDICATIONS ORDERED IN ED: ?Medications  ?morphine (PF) 4 MG/ML injection 4 mg (has no administration in time range)  ?morphine (PF) 2 MG/ML injection 2 mg (has no administration in time range)  ?amLODipine (NORVASC) tablet 10 mg (has no administration in time range)  ?hydrochlorothiazide (HYDRODIURIL) tablet 25 mg (has no administration in time range)  ?irbesartan (AVAPRO) tablet 300 mg (has no administration in time range)  ?donepezil (ARICEPT) tablet 10 mg (has no administration in time range)  ?melatonin tablet 1 mg (has no administration in time range)  ?HYDROcodone-acetaminophen (NORCO/VICODIN) 5-325 MG per tablet 1-2 tablet (has no administration in time range)  ?morphine (PF) 2 MG/ML injection 0.5 mg (has no administration in time range)  ?methocarbamol (ROBAXIN) tablet 500 mg (has no administration in time range)  ?  Or  ?methocarbamol (ROBAXIN) 500 mg in dextrose 5 % 50 mL IVPB (has no administration in time range)  ?senna (SENOKOT) tablet 8.6 mg (has no administration in time range)  ?ceFAZolin (ANCEF) IVPB 2g/100 mL premix (has no administration in time range)  ?sodium chloride 0.9 % bolus 500 mL (has no administration in time range)  ?morphine (PF) 4 MG/ML injection 4 mg (4 mg Intravenous Given 01/06/22 1416)  ?ondansetron (ZOFRAN) injection 4 mg (4 mg Intravenous Given 01/06/22 1414)  ? ? ? ?IMPRESSION / MDM / ASSESSMENT AND PLAN / ED COURSE  ?I reviewed the triage vital signs and the nursing notes. ?             ?                ? ?Differential diagnosis includes, but is not limited to, orthopedic fracture or injury.  Patient has exam findings consistent with likely left hip fracture.  No other injuries.  No head strike no neck injury.  Not on any anticoagulants.  Fully alert and oriented  with hemodynamics that are reassuring. ? ? ? ?The patient is on the cardiac monitor to evaluate for evidence of arrhythmia and/or significant heart rate changes. ? ?Clinical Course as of 01/06/22 1547  ?Sun Jan 06, 2022  ?1513 CBC is reviewed and normal. [MQ]  ?[id number] Personally reviewed the patient's imaging of the pelvis and hips.  Notable for acute left hip fracture as interpreted by me [MQ]  ?[place] placed and messaging sent to Dr. Leim Fabry [MQ]  ?1515 Updated patient on diagnosis of hip fracture.  Reports pain slightly improved but still having ongoing left hip pain.  Additional morphine ordered. [MQ]  ?[place] chat demonstrates that Dr. Posey Pronto and Dr. Francine Graven have both read consult request regarding left hip fracture [MQ]  ?  ?Clinical Course User Index ?[MQ] Delman Kitten, MD  ? ?----------------------------------------- ?3:46 PM on 01/06/2022 ?----------------------------------------- ?Admission discussed with Dr. Francine Graven.  Patient admitted to the hospitalist service for management of acute left hip fracture.  Also reviewed labs, chemistry mild increase in creatinine from baseline, suspect likely prerenal in nature.  Will provide 500 mL fluid bolus.  Dr. Posey Pronto advised likely OR tomorrow ? ?FINAL CLINICAL IMPRESSION(S) / ED DIAGNOSES  ? ?Final diagnoses:  ?Closed left hip fracture, initial encounter ([place])  ? ? ? ?Rx / DC Orders  ? ?ED Discharge Orders   ? ? None  ? ?  ? ? ? ?Note:  This document was prepared using Dragon voice recognition software and may include unintentional dictation errors. ?  Delman Kitten, MD ?01/06/22 1547 ? ?

## 2022-01-06 NOTE — Plan of Care (Signed)
  Problem: Education: Goal: Knowledge of General Education information will improve Description: Including pain rating scale, medication(s)/side effects and non-pharmacologic comfort measures Outcome: Progressing   Problem: Clinical Measurements: Goal: Ability to maintain clinical measurements within normal limits will improve Outcome: Progressing Goal: Respiratory complications will improve Outcome: Progressing   

## 2022-01-06 NOTE — ED Triage Notes (Signed)
Pt arrives via EMS from [facility] for a mechanical fall- pt states she was standing in her kitchen by the sink, turned around, and slipped- pt states she is having hip pain on the left side- pt has shortening and rotation- pt denies hitting her head ?

## 2022-01-06 NOTE — Assessment & Plan Note (Signed)
Patient has mild cognitive deficits ?Continue Aricept ?

## 2022-01-06 NOTE — Assessment & Plan Note (Signed)
Status post chemotherapy ?Follow-up with oncology as an outpatient ?

## 2022-01-06 NOTE — Assessment & Plan Note (Signed)
Blood pressure is stable ?Continue HCTZ, amlodipine and Avapro ?

## 2022-01-06 NOTE — Progress Notes (Signed)
Full consult note and discussion with patient to follow tomorrow AM.  ? ?Called by ED staff. Imaging reviewed.  ?- Plan for surgery consisting of hip hemiarthroplasty tomorrow, likely afternoon. I did talk to patient and her son, Stephanie Davidson, over the phone just now and we are in agreement with this plan.  ?- NPO after midnight ?- Hold anticoagulation ?- Admit to Hospitalist team. ? ?

## 2022-01-06 NOTE — Assessment & Plan Note (Addendum)
-   s/p mechanical fall ?-Imaging shows displaced left femoral neck fracture ?-Undergoing repair with orthopedic surgery on 01/07/2022 ?-PT/OT after surgery ?- Pain control and DVT prophylaxis after surgery as well ?

## 2022-01-06 NOTE — Plan of Care (Signed)

## 2022-01-06 NOTE — H&P (Signed)
?History and Physical  ? ? ?Patient: Stephanie Davidson ZRA:[id number] DOB: 1930-10-29 ?DOA: 01/06/2022 ?DOS: the patient was seen and examined on 01/06/2022 ?PCP: Derinda Late, MD  ?Patient coming from: Home ? ?Chief Complaint:  ?Chief Complaint  ?Patient presents with  ? Fall  ? ?HPI: Stephanie Davidson is a 88 y.o. 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 ?Patient states she was standing in her kitchen by the sink and turned around and slipped landing on the ground.  She denies feeling dizzy or lightheaded prior to this episode and developed sudden pain in her left hip.  Noted to have shortening and internal rotation of her left leg. ?She denies having any fever, no chills, no chest pain, no cough, no shortness of breath, no headache, no abdominal pain, no mental status changes, no loss of consciousness, no leg swelling, no diaphoresis or palpitations. ?X-ray shows a displaced fracture of the neck of proximal left femur. ? ?Review of Systems: As mentioned in the history of present illness. All other systems reviewed and are negative. ?Past Medical History:  ?Diagnosis Date  ? Breast cancer ([place])   ? Hypertension   ? Personal history of chemotherapy   ? Personal history of radiation therapy   ? ?Past Surgical History:  ?Procedure Laterality Date  ? BREAST BIOPSY Right ?  ? needle bx-benign  ? BREAST BIOPSY Left 05/12/2019  ? [place] bx 12:00, [place] marker, DIFFUSE LARGE B CELL LYMPHOMA  ? BREAST EXCISIONAL BIOPSY Left 2003  ? lymphoma  ? BREAST EXCISIONAL BIOPSY Left ?  ? lump was removed, benign  ? IR IMAGING GUIDED PORT INSERTION  06/02/2019  ? ?Social History:  reports that she has never smoked. She has never used smokeless tobacco. She reports that she does not drink alcohol. No history on file for drug use. ? ?Allergies  ?Allergen  Reactions  ? Latex Rash  ? ? ?Family History  ?Problem Relation Age of Onset  ? Breast cancer Neg Hx   ? ? ?Prior to Admission medications   ?Medication Sig Start Date End Date Taking? Authorizing Provider  ?amLODipine (NORVASC) 10 MG tablet Take 1 tablet by mouth daily. 02/04/19   [provider]  ?donepezil (ARICEPT) 10 MG tablet Take 1 tablet by mouth at bedtime. 02/04/19 04/19/23  [provider]  ?fluticasone (FLONASE) 50 MCG/ACT nasal spray Place 2 sprays into both nostrils daily.  ?Patient not taking: Reported on 04/04/2021 10/06/18   [provider]  ?hydrochlorothiazide (HYDRODIURIL) 25 MG tablet Take 1 tablet by mouth daily. 02/04/19   [provider]  ?irbesartan (AVAPRO) 300 MG tablet Take 1 tablet by mouth daily. 10/07/18   [provider]  ?lidocaine-prilocaine (EMLA) cream Apply to affected area once ?Patient not taking: Reported on 04/04/2021 06/11/19   Cammie Sickle, MD  ?melatonin 1 MG TABS tablet Take by mouth.    [provider]  ?meloxicam (MOBIC) 15 MG tablet Take 1 tablet by mouth as needed.  02/22/19   [provider]  ?meloxicam (MOBIC) 15 MG tablet Take 1 tablet by mouth daily. 02/02/21   [provider]  ?Multiple Vitamin (MULTI-VITAMIN) tablet Take 1 tablet by mouth daily. ?Patient not taking: Reported on 04/04/2021    [provider]  ?traZODone (DESYREL) 50 MG tablet Take 1 tablet by mouth at bedtime. ?Patient not  taking: Reported on 10/24/2021 12/07/20   [provider]  ? ? ?Physical Exam: ?Vitals:  ? 01/06/22 1404 01/06/22 1405 01/06/22 1407 01/06/22 1545  ?BP: (!) 154/66   (!) 135/55  ?Pulse: 94   88  ?Resp: 19   20  ?Temp:   97.9 ?F (36.6 ?C)   ?TempSrc:   Oral   ?SpO2: 99%   96%  ?Weight:  49 kg    ?Height:  5' (1.524 m)    ? ?Physical Exam ?Vitals and nursing note reviewed.  ?Constitutional:   ?   Appearance: Normal appearance.  ?HENT:  ?   Head: Normocephalic and atraumatic.  ?   Nose: Nose normal.   ?   Mouth/Throat:  ?   Mouth: Mucous membranes are moist.  ?Eyes:  ?   Pupils: Pupils are equal, round, and reactive to light.  ?Cardiovascular:  ?   Rate and Rhythm: Normal rate and regular rhythm.  ?Pulmonary:  ?   Effort: Pulmonary effort is normal.  ?   Breath sounds: Normal breath sounds.  ?Abdominal:  ?   General: Abdomen is flat. Bowel sounds are normal.  ?   Palpations: Abdomen is soft.  ?Musculoskeletal:     ?   General: Deformity present.  ?   Cervical back: Normal range of motion and neck supple.  ?   Comments: Shortening and internal rotation of the left lower extremity.  ?Neurological:  ?   General: No focal deficit present.  ?   Mental Status: She is alert and oriented to person, place, and time.  ?Psychiatric:     ?   Mood and Affect: Mood normal.     ?   Behavior: Behavior normal.  ? ? ?Data Reviewed: ?Relevant notes from primary care and specialist visits, past discharge summaries as available in EHR, including Care Everywhere. ?Prior diagnostic testing as pertinent to current admission diagnoses ?Updated medications and problem lists for reconciliation ?ED course, including vitals, labs, imaging, treatment and response to treatment ?Triage notes, nursing and pharmacy notes and ED provider's notes ?Notable results as noted in HPI ?Labs reviewed.  Sodium 134, potassium 5.1, chloride 99, bicarb 19, glucose 178, BUN 31, creatinine 1.13, calcium 9.3, white count 8.9, hemoglobin 12.1, hematocrit 38.7, MCV 93.0, RDW 14.6, platelet count 249 ?Chest x-ray reviewed by me shows Linear densities in the left parahilar region suggest scarring or ?subsegmental atelectasis. There are no signs of alveolar pulmonary ?edema or focal pulmonary consolidation. ?Left hip x-ray shows displaced fracture is seen in the neck of proximal left femur. ?Twelve-lead EKG shows normal sinus rhythm with artifact. ?There are no new results to review at this time. ? ?Assessment and Plan: ?* Left displaced femoral neck fracture  ([place]) ?Status post mechanical fall with a left displaced femoral neck fracture ?Immobilize left lower extremity ?Pain control. ?Place patient on muscle relaxants ?We will consult orthopedic surgery ?Place patient on fall precautions ? ?Lymphoma of breast ([place]) ?Status post chemotherapy ?Follow-up with oncology as an outpatient ? ?Hypertension ?Blood pressure is stable ?Continue HCTZ, amlodipine and Avapro ? ?Dementia ([place]) ?Patient has mild cognitive deficits ?Continue Aricept ? ? ? ? ? Advance Care Planning:   Code Status: Full Code  ? ?Consults: Orthopedic surgery ? ?Family Communication: Greater than 50% of time was spent discussing patient's condition and plan of with her and her son at the bedside.  All questions and concerns have been addressed.  She lists her son and daughter as her healthcare power of attorney. ?CODE  STATUS was discussed and she wishes to be a full code ? ?Severity of Illness: ?The appropriate patient status for this patient is INPATIENT. Inpatient status is judged to be reasonable and necessary in order to provide the required intensity of service to ensure the patient's safety. The patient's presenting symptoms, physical exam findings, and initial radiographic and laboratory data in the context of their chronic comorbidities is felt to place them at high risk for further clinical deterioration. Furthermore, it is not anticipated that the patient will be medically stable for discharge from the hospital within 2 midnights of admission.  ? ?* I certify that at the point of admission it is my clinical judgment that the patient will require inpatient hospital care spanning beyond 2 midnights from the point of admission due to high intensity of service, high risk for further deterioration and high frequency of surveillance required.* ? ?Author: ?Collier Bullock, MD ?01/06/2022 3:51 PM ? ?For on call review [email or web address].  ?

## 2022-01-07 ENCOUNTER — Inpatient Hospital Stay: Payer: Medicare Other | Admitting: Certified Registered"

## 2022-01-07 ENCOUNTER — Inpatient Hospital Stay: Payer: Medicare Other

## 2022-01-07 ENCOUNTER — Encounter: Payer: Self-pay | Admitting: Internal Medicine

## 2022-01-07 ENCOUNTER — Other Ambulatory Visit: Payer: Self-pay

## 2022-01-07 ENCOUNTER — Encounter
Admission: EM | Disposition: A | Discharge status: Skilled Nursing Facility | Payer: Self-pay | Source: Home / Self Care | Attending: Internal Medicine

## 2022-01-07 DIAGNOSIS — S72002A Fracture of unspecified part of neck of left femur, initial encounter for closed fracture: Secondary | ICD-10-CM | POA: Diagnosis not present

## 2022-01-07 HISTORY — PX: HIP ARTHROPLASTY: SHX981

## 2022-01-07 LAB — CBC
HCT: 37.9 % (ref 36.0–46.0)
Hemoglobin: 11.9 g/dL — ABNORMAL LOW (ref 12.0–15.0)
MCH: 29 pg (ref 26.0–34.0)
MCHC: 31.4 g/dL (ref 30.0–36.0)
MCV: 92.4 fL (ref 80.0–100.0)
Platelets: 269 10*3/uL (ref 150–400)
RBC: 4.1 MIL/uL (ref 3.87–5.11)
RDW: 14.3 % (ref 11.5–15.5)
WBC: 11.3 10*3/uL — ABNORMAL HIGH (ref 4.0–10.5)
nRBC: 0 % (ref 0.0–0.2)

## 2022-01-07 LAB — TYPE AND SCREEN
ABO/RH(D): O POS
Antibody Screen: NEGATIVE

## 2022-01-07 LAB — MRSA NEXT GEN BY PCR, NASAL: MRSA by PCR Next Gen: NOT DETECTED

## 2022-01-07 LAB — BASIC METABOLIC PANEL
Anion gap: 8 (ref 5–15)
BUN: 27 mg/dL — ABNORMAL HIGH (ref 8–23)
CO2: 28 mmol/L (ref 22–32)
Calcium: 9.2 mg/dL (ref 8.9–10.3)
Chloride: 101 mmol/L (ref 98–111)
Creatinine, Ser: 0.69 mg/dL (ref 0.44–1.00)
GFR, Estimated: >60 mL/min (ref >60)
Glucose, Bld: 114 mg/dL — ABNORMAL HIGH (ref 70–99)
Potassium: 4.5 mmol/L (ref 3.5–5.1)
Sodium: 137 mmol/L (ref 135–145)

## 2022-01-07 SURGERY — HEMIARTHROPLASTY, HIP, DIRECT ANTERIOR APPROACH, FOR FRACTURE
Anesthesia: General | Site: Hip | Laterality: Left

## 2022-01-07 MED ORDER — BUPIVACAINE LIPOSOME 1.3 % IJ SUSP
INTRAMUSCULAR | Status: AC
  Filled 2022-01-07: qty 20

## 2022-01-07 MED ORDER — KETOROLAC TROMETHAMINE 15 MG/ML IJ SOLN
7.5000 mg | Freq: Four times a day (QID) | INTRAMUSCULAR | Status: AC
  Administered 2022-01-07 – 2022-01-08 (×4): 7.5 mg via INTRAVENOUS
  Filled 2022-01-07 (×4): qty 1

## 2022-01-07 MED ORDER — CEFAZOLIN SODIUM-DEXTROSE 2-4 GM/100ML-% IV SOLN
2.0000 g | Freq: Four times a day (QID) | INTRAVENOUS | Status: AC
  Administered 2022-01-07 – 2022-01-08 (×2): 2 g via INTRAVENOUS
  Filled 2022-01-07 (×2): qty 100

## 2022-01-07 MED ORDER — SODIUM CHLORIDE 0.9 % IV SOLN: INTRAVENOUS | Status: DC

## 2022-01-07 MED ORDER — LACTATED RINGERS IV SOLN
INTRAVENOUS | Status: DC | PRN
Start: 2022-01-07 — End: 2022-01-07

## 2022-01-07 MED ORDER — TRANEXAMIC ACID-NACL 1000-0.7 MG/100ML-% IV SOLN: 1000.0000 mg | Freq: Once | INTRAVENOUS | Status: AC

## 2022-01-07 MED ORDER — TRANEXAMIC ACID-NACL 1000-0.7 MG/100ML-% IV SOLN
INTRAVENOUS | Status: AC
  Administered 2022-01-07: 1000 mg via INTRAVENOUS
  Filled 2022-01-07: qty 100

## 2022-01-07 MED ORDER — LACTATED RINGERS IV SOLN
Freq: Once | INTRAVENOUS | Status: AC
Start: 2022-01-07 — End: 2022-01-07

## 2022-01-07 MED ORDER — 0.9 % SODIUM CHLORIDE (POUR BTL) OPTIME
TOPICAL | Status: DC | PRN
  Administered 2022-01-07: 1000 mL

## 2022-01-07 MED ORDER — BUPIVACAINE HCL (PF) 0.5 % IJ SOLN
INTRAMUSCULAR | Status: AC
  Filled 2022-01-07: qty 30

## 2022-01-07 MED ORDER — METOCLOPRAMIDE HCL 5 MG/ML IJ SOLN: 5.0000 mg | Freq: Three times a day (TID) | INTRAMUSCULAR | Status: DC | PRN

## 2022-01-07 MED ORDER — BISACODYL 10 MG RE SUPP: 10.0000 mg | Freq: Every day | RECTAL | Status: DC | PRN

## 2022-01-07 MED ORDER — ZOLPIDEM TARTRATE 5 MG PO TABS: 5.0000 mg | ORAL_TABLET | Freq: Every evening | ORAL | Status: DC | PRN

## 2022-01-07 MED ORDER — PHENYLEPHRINE HCL-NACL 20-0.9 MG/250ML-% IV SOLN
INTRAVENOUS | Status: DC | PRN
  Administered 2022-01-07: 25 ug/min via INTRAVENOUS

## 2022-01-07 MED ORDER — ADULT MULTIVITAMIN W/MINERALS CH
1.0000 | ORAL_TABLET | Freq: Every day | ORAL | Status: DC
  Administered 2022-01-07 – 2022-01-09 (×3): 1 via ORAL
  Filled 2022-01-07 (×3): qty 1

## 2022-01-07 MED ORDER — PHENYLEPHRINE HCL (PRESSORS) 10 MG/ML IV SOLN
INTRAVENOUS | Status: AC
  Filled 2022-01-07: qty 1

## 2022-01-07 MED ORDER — CEFAZOLIN SODIUM-DEXTROSE 2-4 GM/100ML-% IV SOLN
INTRAVENOUS | Status: AC
  Filled 2022-01-07: qty 100

## 2022-01-07 MED ORDER — CHLORHEXIDINE GLUCONATE CLOTH 2 % EX PADS
6.0000 | MEDICATED_PAD | Freq: Every day | CUTANEOUS | Status: DC
  Administered 2022-01-08: 6 via TOPICAL

## 2022-01-07 MED ORDER — DOCUSATE SODIUM 100 MG PO CAPS
100.0000 mg | ORAL_CAPSULE | Freq: Two times a day (BID) | ORAL | Status: DC
  Administered 2022-01-07 – 2022-01-09 (×4): 100 mg via ORAL
  Filled 2022-01-07 (×4): qty 1

## 2022-01-07 MED ORDER — BUPIVACAINE LIPOSOME 1.3 % IJ SUSP
INTRAMUSCULAR | Status: DC | PRN
  Administered 2022-01-07: 50 mL

## 2022-01-07 MED ORDER — PROPOFOL 500 MG/50ML IV EMUL
INTRAVENOUS | Status: AC
  Filled 2022-01-07: qty 50

## 2022-01-07 MED ORDER — METOCLOPRAMIDE HCL 10 MG PO TABS: 5.0000 mg | ORAL_TABLET | Freq: Three times a day (TID) | ORAL | Status: DC | PRN

## 2022-01-07 MED ORDER — ACETAMINOPHEN 500 MG PO TABS
1000.0000 mg | ORAL_TABLET | Freq: Three times a day (TID) | ORAL | Status: DC
  Administered 2022-01-07 – 2022-01-09 (×5): 1000 mg via ORAL
  Filled 2022-01-07 (×7): qty 2

## 2022-01-07 MED ORDER — ONDANSETRON HCL 4 MG/2ML IJ SOLN: 4.0000 mg | Freq: Once | INTRAMUSCULAR | Status: DC | PRN

## 2022-01-07 MED ORDER — FENTANYL CITRATE (PF) 100 MCG/2ML IJ SOLN: 25.0000 ug | INTRAMUSCULAR | Status: DC | PRN

## 2022-01-07 MED ORDER — LACTATED RINGERS IV SOLN: INTRAVENOUS | Status: DC

## 2022-01-07 MED ORDER — PHENOL 1.4 % MT LIQD: 1.0000 | OROMUCOSAL | Status: DC | PRN

## 2022-01-07 MED ORDER — ONDANSETRON HCL 4 MG PO TABS: 4.0000 mg | ORAL_TABLET | Freq: Four times a day (QID) | ORAL | Status: DC | PRN

## 2022-01-07 MED ORDER — OXYCODONE HCL 5 MG PO TABS: 5.0000 mg | ORAL_TABLET | ORAL | Status: DC | PRN

## 2022-01-07 MED ORDER — FLEET ENEMA 7-19 GM/118ML RE ENEM: 1.0000 | ENEMA | Freq: Once | RECTAL | Status: DC | PRN

## 2022-01-07 MED ORDER — ENSURE ENLIVE PO LIQD
237.0000 mL | Freq: Two times a day (BID) | ORAL | Status: DC
  Administered 2022-01-07 – 2022-01-09 (×2): 237 mL via ORAL
  Filled 2022-01-07: qty 237

## 2022-01-07 MED ORDER — TRAMADOL HCL 50 MG PO TABS: 50.0000 mg | ORAL_TABLET | Freq: Four times a day (QID) | ORAL | Status: DC | PRN

## 2022-01-07 MED ORDER — OXYCODONE HCL 5 MG/5ML PO SOLN: 5.0000 mg | Freq: Once | ORAL | Status: DC | PRN

## 2022-01-07 MED ORDER — ONDANSETRON HCL 4 MG/2ML IJ SOLN: 4.0000 mg | Freq: Four times a day (QID) | INTRAMUSCULAR | Status: DC | PRN

## 2022-01-07 MED ORDER — OXYCODONE HCL 5 MG PO TABS: 5.0000 mg | ORAL_TABLET | Freq: Once | ORAL | Status: DC | PRN

## 2022-01-07 MED ORDER — TRANEXAMIC ACID-NACL 1000-0.7 MG/100ML-% IV SOLN
INTRAVENOUS | Status: DC | PRN
  Administered 2022-01-07: 1000 mg via INTRAVENOUS

## 2022-01-07 MED ORDER — FENTANYL CITRATE (PF) 100 MCG/2ML IJ SOLN
INTRAMUSCULAR | Status: AC
  Filled 2022-01-07: qty 2

## 2022-01-07 MED ORDER — HYDROMORPHONE HCL 1 MG/ML IJ SOLN: 0.2000 mg | INTRAMUSCULAR | Status: DC | PRN

## 2022-01-07 MED ORDER — PROPOFOL 500 MG/50ML IV EMUL
INTRAVENOUS | Status: DC | PRN
  Administered 2022-01-07: 25 ug/kg/min via INTRAVENOUS

## 2022-01-07 MED ORDER — SODIUM CHLORIDE 0.9 % IR SOLN
Status: DC | PRN
  Administered 2022-01-07: 3000 mL

## 2022-01-07 MED ORDER — TRANEXAMIC ACID 1000 MG/10ML IV SOLN
INTRAVENOUS | Status: AC
  Filled 2022-01-07: qty 10

## 2022-01-07 MED ORDER — MENTHOL 3 MG MT LOZG: 1.0000 | LOZENGE | OROMUCOSAL | Status: DC | PRN

## 2022-01-07 MED ORDER — EPHEDRINE SULFATE (PRESSORS) 50 MG/ML IJ SOLN
INTRAMUSCULAR | Status: DC | PRN
  Administered 2022-01-07 (×4): 5 mg via INTRAVENOUS

## 2022-01-07 MED ORDER — CEFAZOLIN SODIUM-DEXTROSE 2-3 GM-%(50ML) IV SOLR
INTRAVENOUS | Status: DC | PRN
  Administered 2022-01-07: 2 g via INTRAVENOUS

## 2022-01-07 MED ORDER — PHENYLEPHRINE HCL (PRESSORS) 10 MG/ML IV SOLN
INTRAVENOUS | Status: DC | PRN
  Administered 2022-01-07 (×3): 100 ug via INTRAVENOUS

## 2022-01-07 MED ORDER — ENOXAPARIN SODIUM 40 MG/0.4ML IJ SOSY
40.0000 mg | PREFILLED_SYRINGE | INTRAMUSCULAR | Status: DC
  Administered 2022-01-08 – 2022-01-09 (×2): 40 mg via SUBCUTANEOUS
  Filled 2022-01-07 (×2): qty 0.4

## 2022-01-07 MED ORDER — NEOMYCIN-POLYMYXIN B GU 40-200000 IR SOLN
Status: AC
  Filled 2022-01-07: qty 20

## 2022-01-07 MED ORDER — BUPIVACAINE HCL (PF) 0.5 % IJ SOLN
INTRAMUSCULAR | Status: DC | PRN
  Administered 2022-01-07: 2.5 mL via INTRATHECAL

## 2022-01-07 MED ORDER — PROPOFOL 10 MG/ML IV BOLUS
INTRAVENOUS | Status: DC | PRN
  Administered 2022-01-07 (×3): 20 mg via INTRAVENOUS

## 2022-01-07 MED ORDER — OXYCODONE HCL 5 MG PO TABS: 10.0000 mg | ORAL_TABLET | ORAL | Status: DC | PRN

## 2022-01-07 MED ORDER — FENTANYL CITRATE (PF) 100 MCG/2ML IJ SOLN
INTRAMUSCULAR | Status: DC | PRN
  Administered 2022-01-07 (×2): 25 ug via INTRAVENOUS
  Administered 2022-01-07: 50 ug via INTRAVENOUS

## 2022-01-07 SURGICAL SUPPLY — 77 items
BLADE SAW SGTL 13X75X1.27 (BLADE) ×2 IMPLANT
BLADE SURG SZ10 CARB STEEL (BLADE) ×2 IMPLANT
BNDG COHESIVE 4X5 TAN ST LF (GAUZE/BANDAGES/DRESSINGS) ×2 IMPLANT
BOWL CEMENT MIXING ADV NOZZLE (MISCELLANEOUS) ×1 IMPLANT
CEMENT GMV SMARTSET GENT 40G (Cement) ×2 IMPLANT
CHLORAPREP W/TINT 26 (MISCELLANEOUS) ×2 IMPLANT
COVER BACK TABLE REUSABLE LG (DRAPES) ×2 IMPLANT
COVER MAYO STAND STRL (DRAPES) ×2 IMPLANT
DERMABOND ADVANCED (GAUZE/BANDAGES/DRESSINGS) ×1
DERMABOND ADVANCED .7 DNX12 (GAUZE/BANDAGES/DRESSINGS) ×1 IMPLANT
DRAPE 3/4 80X56 (DRAPES) ×6 IMPLANT
DRAPE INCISE IOBAN 66X60 STRL (DRAPES) ×2 IMPLANT
DRAPE ORTHO SPLIT 77X108 STRL (DRAPES) ×1
DRAPE SURG 17X11 SM STRL (DRAPES) ×2 IMPLANT
DRAPE SURG ORHT 6 SPLT 77X108 (DRAPES) ×1 IMPLANT
DRAPE U-SHAPE 47X51 STRL (DRAPES) ×2 IMPLANT
DRSG OPSITE POSTOP 4X10 (GAUZE/BANDAGES/DRESSINGS) ×2 IMPLANT
DRSG OPSITE POSTOP 4X8 (GAUZE/BANDAGES/DRESSINGS) ×2 IMPLANT
ELECT CAUTERY BLADE 6.4 (BLADE) ×2 IMPLANT
ELECT REM PT RETURN 9FT ADLT (ELECTROSURGICAL) ×2
ELECTRODE REM PT RTRN 9FT ADLT (ELECTROSURGICAL) ×1 IMPLANT
GAUZE 4X4 16PLY ~~LOC~~+RFID DBL (SPONGE) ×2 IMPLANT
GAUZE SPONGE 4X4 12PLY STRL (GAUZE/BANDAGES/DRESSINGS) ×2 IMPLANT
GAUZE XEROFORM 1X8 LF (GAUZE/BANDAGES/DRESSINGS) ×2 IMPLANT
GLOVE BIOGEL PI IND STRL 8 (GLOVE) ×2 IMPLANT
GLOVE BIOGEL PI INDICATOR 8 (GLOVE) ×2
GLOVE SURG ORTHO 8.0 STRL STRW (GLOVE) ×4 IMPLANT
GOWN STRL REUS W/ TWL LRG LVL3 (GOWN DISPOSABLE) ×1 IMPLANT
GOWN STRL REUS W/ TWL XL LVL3 (GOWN DISPOSABLE) ×1 IMPLANT
GOWN STRL REUS W/TWL LRG LVL3 (GOWN DISPOSABLE) ×1
GOWN STRL REUS W/TWL XL LVL3 (GOWN DISPOSABLE) ×1
HEAD MODULAR ENDO (Orthopedic Implant) ×1 IMPLANT
HEAD UNPLR 47XMDLR STRL HIP (Orthopedic Implant) IMPLANT
HEMOVAC 400ML (MISCELLANEOUS)
HOLSTER ELECTROSUGICAL PENCIL (MISCELLANEOUS) ×2 IMPLANT
IV NS IRRIG 3000ML ARTHROMATIC (IV SOLUTION) ×2 IMPLANT
KIT DRAIN HEMOVAC JP 7FR 400ML (MISCELLANEOUS) IMPLANT
KIT PREP HIP W/CEMENT RESTRICT (Miscellaneous) IMPLANT
KIT PREPARATION TOTAL HIP (Miscellaneous) ×1 IMPLANT
KIT TURNOVER KIT A (KITS) ×2 IMPLANT
MANIFOLD NEPTUNE II (INSTRUMENTS) ×4 IMPLANT
NDL FILTER BLUNT 18X1 1/2 (NEEDLE) ×1 IMPLANT
NDL MAYO CATGUT SZ4 TPR NDL (NEEDLE) ×1 IMPLANT
NDL SAFETY ECLIPSE 18X1.5 (NEEDLE) ×1 IMPLANT
NEEDLE FILTER BLUNT 18X 1/2SAF (NEEDLE) ×1
NEEDLE FILTER BLUNT 18X1 1/2 (NEEDLE) ×1 IMPLANT
NEEDLE HYPO 18GX1.5 SHARP (NEEDLE) ×1
NEEDLE HYPO 22GX1.5 SAFETY (NEEDLE) ×2 IMPLANT
NEEDLE MAYO CATGUT SZ4 (NEEDLE) ×2 IMPLANT
NS IRRIG 1000ML POUR BTL (IV SOLUTION) ×2 IMPLANT
PACK HIP PROSTHESIS (MISCELLANEOUS) ×2 IMPLANT
PENCIL SMOKE EVACUATOR (MISCELLANEOUS) ×3 IMPLANT
PILLOW ABDUCTION FOAM SM (MISCELLANEOUS) ×2 IMPLANT
PULSAVAC PLUS IRRIG FAN TIP (DISPOSABLE) ×2
RETRIEVER SUT HEWSON (MISCELLANEOUS) IMPLANT
SLEEVE UNITRAX V40 STD (Orthopedic Implant) ×1 IMPLANT
SPACER OSTEO CEMENT (Orthopedic Implant) ×1 IMPLANT
SPACER OSTEO CEMENT 12HIP (Orthopedic Implant) IMPLANT
SPONGE T-LAP 18X18 ~~LOC~~+RFID (SPONGE) ×8 IMPLANT
STAPLER SKIN PROX 35W (STAPLE) ×2 IMPLANT
STEM FEM CMT 43X131X35 SZ3 (Stem) ×1 IMPLANT
SUT ETHIBOND #5 BRAIDED 30INL (SUTURE) ×2 IMPLANT
SUT MNCRL 4-0 (SUTURE) ×1
SUT MNCRL 4-0 27XMFL (SUTURE) ×1
SUT VIC AB 0 CT1 36 (SUTURE) ×2 IMPLANT
SUT VIC AB 2-0 CT2 27 (SUTURE) ×4 IMPLANT
SUTURE MNCRL 4-0 27XMF (SUTURE) ×1 IMPLANT
SYR 20ML LL LF (SYRINGE) ×2 IMPLANT
SYR 50ML LL SCALE MARK (SYRINGE) ×2 IMPLANT
TAPE MICROFOAM 4IN (TAPE) ×2 IMPLANT
TAPE TRANSPORE STRL 2 31045 (GAUZE/BANDAGES/DRESSINGS) ×2 IMPLANT
TIP BRUSH PULSAVAC PLUS 24.33 (MISCELLANEOUS) ×2 IMPLANT
TIP FAN IRRIG PULSAVAC PLUS (DISPOSABLE) ×1 IMPLANT
TUBE KAMVAC SUCTION (TUBING) ×2 IMPLANT
TUBE SUCT KAM VAC (TUBING) ×2 IMPLANT
WATER STERILE IRR 500ML POUR (IV SOLUTION) ×2 IMPLANT
kit preparation total hip S&N ×1 IMPLANT

## 2022-01-07 NOTE — Anesthesia Preprocedure Evaluation (Addendum)
Anesthesia Evaluation  ?Patient identified by MRN, date of birth, ID band ?Patient awake ? ? ? ?Reviewed: ?Allergy & Precautions, NPO status , Patient's Chart, lab work & pertinent test results ? ?History of Anesthesia Complications ?Negative for: history of anesthetic complications ? ?Airway ?Mallampati: IV ? ? ?Neck ROM: Full ? ? ? Dental ? ?(+) Partial Lower ?  ?Pulmonary ?neg pulmonary ROS,  ?  ?Pulmonary exam normal ?breath sounds clear to auscultation ? ? ? ? ? ? Cardiovascular ?hypertension, Normal cardiovascular exam ?Rhythm:Regular Rate:Normal ? ?ECG 01/06/22:  ?Atrial fibrillation ?Left bundle branch block ?Artifact in lead(s) I II III aVR aVL V1 V2 and baseline wander in lead(s) V6 ?  ?Neuro/Psych ?PSYCHIATRIC DISORDERS Dementia negative neurological ROS ?   ? GI/Hepatic ?negative GI ROS,   ?Endo/Other  ?negative endocrine ROS ? Renal/GU ?negative Renal ROS  ? ?  ?Musculoskeletal ? ? Abdominal ?  ?Peds ? Hematology ?Breast CA, lymphoma   ?Anesthesia Other Findings ? ? Reproductive/Obstetrics ? ?  ? ? ? ? ? ? ? ? ? ? ? ? ? ?  ?  ? ? ? ? ? ? ? ?Anesthesia Physical ?Anesthesia Plan ? ?ASA: 2 and emergent ? ?Anesthesia Plan: General and Spinal  ? ?Post-op Pain Management:   ? ?Induction: Intravenous ? ?PONV Risk Score and Plan: 3 and Propofol infusion, TIVA, Treatment may vary due to age or medical condition and Ondansetron ? ?Airway Management Planned: Natural Airway and Nasal Cannula ? ?Additional Equipment:  ? ?Intra-op Plan:  ? ?Post-operative Plan:  ? ?Informed Consent: I have reviewed the patients History and Physical, chart, labs and discussed the procedure including the risks, benefits and alternatives for the proposed anesthesia with the patient or authorized representative who has indicated his/her understanding and acceptance.  ? ? ? ? ? ?Plan Discussed with: CRNA ? ?Anesthesia Plan Comments: (Plan for spinal and GA with natural airway, LMA/GETA backup.  Patient  consented for risks of anesthesia including but not limited to:  ?- adverse reactions to medications ?- damage to eyes, teeth, lips or other oral mucosa ?- nerve damage due to positioning  ?- sore throat or hoarseness ?- headache, bleeding, infection, nerve damage 2/2 spinal ?- damage to heart, brain, nerves, lungs, other parts of body or loss of life ? ?Informed patient about role of CRNA in peri- and intra-operative care.  Patient voiced understanding.)  ? ? ? ? ? ?Anesthesia Quick Evaluation ? ?

## 2022-01-07 NOTE — Consult Note (Signed)
ORTHOPAEDIC CONSULTATION ? ?REQUESTING PHYSICIAN: Dwyane Dee, MD ? ?Chief Complaint:   ?L hip pain ? ?History of Present Illness: ?Stephanie Davidson is a 88 y.o. female who had yesterday after slipping in her own kitchen. She lives at [facility] assisted living by herself.  The patient noted immediate hip pain and inability to ambulate.  The patient unassisted at baseline.  Pain is worse with any sort of movement.  X-rays in the emergency department show a left displaced femoral neck fracture. ? ?She has a history of lymphoma of the breast s/p chemotherapy.  ? ?Past Medical History:  ?Diagnosis Date  ? Breast cancer ([place])   ? Hypertension   ? Personal history of chemotherapy   ? Personal history of radiation therapy   ? ?Past Surgical History:  ?Procedure Laterality Date  ? BREAST BIOPSY Right ?  ? needle bx-benign  ? BREAST BIOPSY Left 05/12/2019  ? [place] bx 12:00, venus marker, DIFFUSE LARGE B CELL LYMPHOMA  ? BREAST EXCISIONAL BIOPSY Left 2003  ? lymphoma  ? BREAST EXCISIONAL BIOPSY Left ?  ? lump was removed, benign  ? IR IMAGING GUIDED PORT INSERTION  06/02/2019  ? ?Social History  ? ?Socioeconomic History  ? Marital status: Widowed  ?  Spouse name: Not on file  ? Number of children: 2  ? Years of education: Not on file  ? Highest education level: Not on file  ?Occupational History  ? Not on file  ?Tobacco Use  ? Smoking status: Never  ? Smokeless tobacco: Never  ?Vaping Use  ? Vaping Use: Never used  ?Substance and Sexual Activity  ? Alcohol use: No  ? Drug use: Not on file  ? Sexual activity: Not on file  ?Other Topics Concern  ? Not on file  ?Social History Narrative  ? [place]; own apartment; no smoking; no alcohol. 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   ? ?Social Determinants of Health  ? ?Financial Resource Strain: Not on file  ?Food Insecurity: Not on file  ?Transportation Needs: Not on file  ?Physical Activity: Not on file  ?Stress: Not on file  ?Social  Connections: Not on file  ? ?Family History  ?Problem Relation Age of Onset  ? Breast cancer Neg Hx   ? ?Allergies  ?Allergen Reactions  ? Latex Rash  ? ?Prior to Admission medications   ?Medication Sig Start Date End Date Taking? Authorizing Provider  ?amLODipine (NORVASC) 10 MG tablet Take 1 tablet by mouth daily. 02/04/19  Yes [provider]  ?donepezil (ARICEPT) 10 MG tablet Take 1 tablet by mouth at bedtime. 02/04/19 04/19/23 Yes [provider]  ?fluticasone (FLONASE) 50 MCG/ACT nasal spray Place 2 sprays into both nostrils daily. 10/06/18  Yes [provider]  ?hydrochlorothiazide (HYDRODIURIL) 25 MG tablet Take 1 tablet by mouth daily. 02/04/19  Yes [provider]  ?irbesartan (AVAPRO) 300 MG tablet Take 1 tablet by mouth daily. 10/07/18  Yes [provider]  ?melatonin 1 MG TABS tablet Take by mouth.   Yes [provider]  ?meloxicam (MOBIC) 15 MG tablet Take 1 tablet by mouth as needed.  02/22/19  Yes [provider]  ?traZODone (DESYREL) 50 MG tablet Take 1 tablet by mouth at bedtime. 12/07/20  Yes [provider]  ?lidocaine-prilocaine (EMLA) cream Apply to affected area once ?Patient not taking: Reported on 04/04/2021 06/11/19   Cammie Sickle, MD  ?Multiple Vitamin (MULTI-VITAMIN) tablet Take 1 tablet by mouth daily. ?Patient not taking: Reported on 04/04/2021  [provider]  ? ?Recent Labs  ?  01/06/22 ?1408 01/06/22 ?1622 01/07/22 ?0335  ?WBC 8.9  --  11.3*  ?HGB 12.1  --  11.9*  ?HCT 38.7  --  37.9  ?PLT 249  --  269  ?K 5.1  --  4.5  ?CL 99  --  101  ?CO2 19*  --  28  ?BUN 31*  --  27*  ?CREATININE 1.13*  --  0.69  ?GLUCOSE 178*  --  114*  ?CALCIUM 9.3  --  9.2  ?INR  --  1.0  --   ? ?DG Chest 1 View ? ?Result Date: 01/06/2022 ?CLINICAL DATA:  Difficulty breathing EXAM: CHEST  1 VIEW COMPARISON:  01/28/2011 FINDINGS: Transverse diameter of heart is within normal limits. There are new linear densities in the left  parahilar region. Rest of the lung fields are essentially clear. There is no pleural effusion or pneumothorax. There is calcified granulomas in the left upper lung fields and left hilum with no significant change. Tip of right IJ chest port is seen in the superior vena cava. IMPRESSION: Linear densities in the left parahilar region suggest scarring or subsegmental atelectasis. There are no signs of alveolar pulmonary edema or focal pulmonary consolidation. Electronically Signed   By: Elmer Picker M.D.   On: 01/06/2022 15:02  ? ?DG Hip Unilat W or Wo Pelvis 2-3 Views Left ? ?Result Date: 01/06/2022 ?CLINICAL DATA:  Trauma, fall EXAM: DG HIP (WITH OR WITHOUT PELVIS) 2-3V LEFT COMPARISON:  None. FINDINGS: There is displaced fracture in the neck of left femur. There is superior displacement of distal major fracture fragment. There is varus deformity at the fracture site. There is previous internal fixation in the proximal right femur. Degenerative changes are noted in the visualized lower lumbar spine. IMPRESSION: Displaced fracture is seen in the neck of proximal left femur. Electronically Signed   By: Elmer Picker M.D.   On: 01/06/2022 14:59   ? ? ?Positive ROS: All other systems have been reviewed and were otherwise negative with the exception of those mentioned in the HPI and as above. ? ?Physical Exam: ?BP (!) 127/58   Pulse 78   Temp 98.1 ?F (36.7 ?C) (Tympanic)   Resp 20   Ht 5' (1.524 m)   Wt 50.8 kg   SpO2 91%   BMI 21.87 kg/m?  ?General:  Alert, no acute distress ?Psychiatric:  Patient is competent for consent with normal mood and affect   ?Cardiovascular:  No pedal edema, regular rate and rhythm ?Respiratory:  No wheezing, non-labored breathing ?GI:  Abdomen is soft and non-tender ?Skin:  No lesions in the area of chief complaint, no erythema ?Neurologic:  Sensation intact distally, CN grossly intact ?Lymphatic:  No axillary or cervical lymphadenopathy ? ?Orthopedic Exam:  ?LLE: ?+  DF/PF/EHL ?SILT grossly over foot ?Foot wwp ?+Log roll/axial load ?Leg shortened and externally rotated ? ? ?X-rays:  ?As above: L displaced femoral neck fracture ? ?Assessment/Plan: ?1. I discussed the various treatment options including both surgical and non-surgical management of the fracture with the patient and/or family (medical PoA). We discussed the high risk of perioperative complications due to patient's age, and other co-morbidities. After discussion of risks, benefits, and alternatives to surgery, the family and/or patient were in agreement to proceed with surgery. The goals of surgery would be to provide adequate pain relief and allow for mobilization. Plan for surgery is L hip hemiarthroplasty today, 01/07/2022. ?2. NPO until OR ?3. Hold anticoagulation  in advance of OR ? ? ? ? ? ?Leim Fabry ? ? ?01/07/2022 ?11:49 AM ? ?

## 2022-01-07 NOTE — TOC Initial Note (Signed)
Transition of Care (TOC) - Initial/Assessment Note  ? ? ?Patient Details  ?Name: Stephanie Davidson ?MRN: [id number] ?Date of Birth: [birth year] ? ?Transition of Care (TOC) CM/SW Contact:    ?Conception Oms, RN ?Phone Number: ?01/07/2022, 10:15 AM ? ?Clinical Narrative:                Patient resides at [facility] in ALF, She will likely need to go to STR at [facility] prior to returning to ALF ? ? ?  ?  ? ? ?Patient Goals and CMS Choice ?  ?  ?  ? ?Expected Discharge Plan and Services ?  ?  ?  ?  ?  ?                ?  ?  ?  ?  ?  ?  ?  ?  ?  ?  ? ?Prior Living Arrangements/Services ?  ?  ?  ?       ?  ?  ?  ?  ? ?Activities of Daily Living ?Home Assistive Devices/Equipment: None ?ADL Screening (condition at time of admission) ?Patient's cognitive ability adequate to safely complete daily activities?: No ?Is the patient deaf or have difficulty hearing?: No ?Does the patient have difficulty seeing, even when wearing glasses/contacts?: No ?Does the patient have difficulty concentrating, remembering, or making decisions?: No ?Patient able to express need for assistance with ADLs?: Yes ?Does the patient have difficulty dressing or bathing?: No ?Independently performs ADLs?: Yes (appropriate for developmental age) ?Does the patient have difficulty walking or climbing stairs?: No ?Weakness of Legs: None ?Weakness of Arms/Hands: None ? ?Permission Sought/Granted ?  ?  ?   ?   ?   ?   ? ?Emotional Assessment ?  ?  ?  ?  ?  ?  ? ?Admission diagnosis:  Left displaced femoral neck fracture ([place]) [S72.002A] ?Closed left hip fracture, initial encounter ([place]) [S72.002A] ?Patient Active Problem List  ? Diagnosis Date Noted  ? Left displaced femoral neck fracture ([place]) 01/06/2022  ? GERD (gastroesophageal reflux disease) 06/01/2019  ? Hyperlipidemia 06/01/2019  ? Hypertension 06/01/2019  ? Dementia ([place]) 06/01/2019  ? Encounter for antineoplastic chemotherapy 05/26/2019  ? Lymphoma of breast ([place]) 05/25/2019  ? ?PCP:  Derinda Late,  MD ?Pharmacy:   ?[facility] - [place], [place] ?[address] ?[place] [address] ?Phone: [phone] Fax: [phone] ? ? ? ? ?Social Determinants of Health (SDOH) Interventions ?  ? ?Readmission Risk Interventions ?   ? View : No data to display.  ?  ?  ?  ? ? ? ?

## 2022-01-07 NOTE — Op Note (Signed)
DATE OF SURGERY: 01/07/2022 ? ?PREOPERATIVE DIAGNOSIS: Left femoral neck fracture ? ?POSTOPERATIVE DIAGNOSIS: Left femoral neck fracture ? ?PROCEDURE: Left hip hemiarthroplasty ? ?SURGEON: Cato Mulligan, MD ? ?ASSISTANTS: Turner Daniels, PA-S  ? ?EBL: 200 cc ? ?COMPONENTS:  ?[employer] - Accolade C Size 3 Stem ?[employer] - Unitrax 1m head with neutral neck ? ? ?INDICATIONS: ?Stephanie MOHRMANNis a 88y.o. female who sustained a displaced femoral neck fracture after a fall. Risks and benefits of hip hemiarthroplasty were explained to the patient and/or family. Risks include but are not limited to bleeding, infection, injury to tissues, nerves, vessels, periprosthetic infection, dislocation, limb length discrepancy and risks of anesthesia. The patient and/or family understands these risks, has completed an informed consent and wishes to proceed. ?  ?PROCEDURE:  ?The patient was identified in the preoperative holding area and the operative extremity was marked.  The patient was then transferred to the operating room suite and mobilized from the hospital gurney to the operating room table. Spinal anesthesia was administered without complication. The patient was then transitioned to a lateral position.  All bony prominences were padded per protocol.  An axillary roll was placed.  Careful attention was paid to the contralateral side peroneal nerve, which was free from pressure with use of appropriate padding and blankets. A time-out was performed to confirm the patient's identity and the correct laterality of surgery. The patient was then prepped and draped in the usual sterile fashion. Appropriate pre-operative antibiotics were administered. Tranexamic acid was administered preoperatively.  ?  ?An incision that centered on the posterior tip of the greater trochanter with a posterior curve was made. Dissection was carried down through the subcutaneous tissue.  Careful attention was made to maintain hemostasis using electrocautery.   Dissection brought u[place]oreato the level of the deep fascia where the gluteus maximus muscle and proximal portion of the IT band were identified.  The proximal region of the IT band was incised in linear fashion and this incision was extended proximally in a curvilinear fashion to split the gluteus maximus muscle parallel to its fibers to minimize bleeding.  This was accomplished using a combination bovie electrocautery as well as blunt dissection.  The trochanteric bursa was then visualized and dissected from anterior to posterior. A blunt homan retractor was placed beneath the abductors. The piriformis tendon and short external rotators were visualized. Bovie electrocautery was used to cut these with the capsule as one L-shaped flap. This was tagged at the corner with #5 Ethibond. At this point, the femoral neck fracture was visualized. An oscillating saw was used to make a new neck cut approximately 132mabove the lesser tuberosity with the use of a neck cut guide. The head was then freed from its remaining soft tissue attachments and measured. The head trial was then inserted into the acetabulum and sized appropriately.  ?  ?We then turned our attention to preparing the femoral canal. First, a box cut was performed utilizing the box osteotome. A canal finder was inserted by hand and sequential broaching was then performed. The calcar planer was inserted onto the broach and used to smooth the calcar appropriately.  A trial stem, neutral neck, and head were inserted into the acetabulum and placed through range of motion. Intraoperative radiographs were taken to assess hardware position and leg lengths. The hip was again dislocated and the femoral trial components were removed. An appropriately sized cement restrictor was placed. The canal was irrigated with pulse lavage and dried. A cement  gun was used to place cement into the femoral canal. Cement was then pressurized. The actual stem was inserted into the femoral  canal and then driven onto the calcar. Position was held until the cement hardened. The trial head was then again inserted on the femoral component and found to be appropriate. Trial was removed and the permanent head/neck was Morse tapered onto the femoral stem and then reduced into the acetabulum.  ?  ?The hip stability and length were reassessed and found to be satisfactory.  The wound was then copiously irrigated with normal saline solution. The tagged sutures of the capsule and piriformis were sewn to the gluteus medius tendon. This adequately closed the hip capsule. The IT band and gluteus maximus fascia were then closed with 0-Vicryl in a running, locked fashion. A mixture of Exparil and Sensoricaine was administered.  The subdermal layer was closed with 2-0 Vicryl in a buried interrupted fashion. Skin was approximated with staples.  Sterile dressing was applied. An abduction pillow was placed. The patient was mobilized from the lateral position back to supine on the operating room table and then awakened from general anesthesia without complication. ? ? ?POSTOPERATIVE PLAN: ?The patient will be WBAT on operative extremity. Lovenox '40mg'$ /day x 4 weeks to start on POD#1. IV Abx x 24 hours. PT/OT on POD#1. Posterior hip precautions.  ? ?

## 2022-01-07 NOTE — Anesthesia Postprocedure Evaluation (Signed)
Anesthesia Post Note ? ?Patient: Stephanie Davidson ? ?Procedure(s) Performed: ARTHROPLASTY BIPOLAR HIP (HEMIARTHROPLASTY) (Left: Hip) ? ?Patient location during evaluation: PACU ?Anesthesia Type: Spinal ?Level of consciousness: awake and alert, oriented and patient cooperative ?Pain management: pain level controlled ?Vital Signs Assessment: post-procedure vital signs reviewed and stable ?Respiratory status: spontaneous breathing, nonlabored ventilation and respiratory function stable ?Cardiovascular status: blood pressure returned to baseline and stable ?Postop Assessment: adequate PO intake, no headache and spinal receding ?Anesthetic complications: no ? ? ?No notable events documented. ? ? ?Last Vitals:  ?Vitals:  ? 01/07/22 1430 01/07/22 1445  ?BP: (!) 91/59 109/66  ?Pulse: 87 87  ?Resp: 18 15  ?Temp:    ?SpO2: 95% 100%  ?  ?Last Pain:  ?Vitals:  ? 01/07/22 1424  ?TempSrc:   ?PainSc: 0-No pain  ? ? ?LLE Motor Response: Purposeful movement (01/07/22 1456) ?LLE Sensation: Full sensation (01/07/22 1456) ?RLE Motor Response: Purposeful movement (01/07/22 1456) ?RLE Sensation: Full sensation (01/07/22 1456) ?  ?  ? ?Darrin Nipper ? ? ? ? ?

## 2022-01-07 NOTE — Plan of Care (Signed)

## 2022-01-07 NOTE — Anesthesia Procedure Notes (Addendum)
Spinal ? ?Patient location during procedure: OR ?Start time: 01/07/2022 12:25 PM ?Reason for block: surgical anesthesia ?Staffing ?Performed: resident/CRNA  ?Preanesthetic Checklist ?Completed: patient identified, IV checked, site marked, risks and benefits discussed, surgical consent, monitors and equipment checked, pre-op evaluation and timeout performed ?Spinal Block ?Patient position: sitting ?Prep: DuraPrep ?Patient monitoring: heart rate, cardiac monitor, continuous pulse ox and blood pressure ?Approach: midline ?Location: L3-4 ?Injection technique: single-shot ?Needle ?Needle type: Sprotte  ?Needle gauge: 24 G ?Needle length: 9 cm ?Assessment ?Sensory level: T4 ?Events: CSF return ?Additional Notes ?Atraumatic x1.  Negative heme, negative paresthesia, no pain with injection, free flow CSF pre/post injection.  Pt tolerated well. ? ? ? ?

## 2022-01-07 NOTE — Progress Notes (Signed)
Initial Nutrition Assessment ? ?DOCUMENTATION CODES:  ? ?Not applicable ? ?INTERVENTION:  ? ?-Once diet is advanced, add:  ? ?-Ensure Enlive po BID, each supplement provides 350 kcal and 20 grams of protein ?-MVI with minerals daily ? ?NUTRITION DIAGNOSIS:  ? ?Increased nutrient needs related to post-op healing as evidenced by estimated needs. ? ?GOAL:  ? ?Patient will meet greater than or equal to 90% of their needs ? ?MONITOR:  ? ?PO intake, Supplement acceptance, Diet advancement, Labs, Weight trends, Skin, I & O's ? ?REASON FOR ASSESSMENT:  ? ?Consult ?Hip fracture protocol, Assessment of nutrition requirement/status ? ?ASSESSMENT:  ? ?Stephanie Davidson is a 88 y.o. 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 ? ?Pt admitted with lt femoral neck fracture.  ? ?Reviewed I/O's: -700 ml x 24 hours ? ?UOP: 700 ml x 24 hours  ? ?Pt unavailable at time of visit. Pt out of room for OR at time of visit. RD unable to obtain further nutrition-related history or complete nutrition-focused physical exam at this time.   ? ?Per orthopedics notes, plan is for lt hip hemiarthroplasty today. Pt is currently NPO for procedure.  ? ?Reviewed wt hx; wt has been stable over the past 13 months.  ? ?Pt with increased nutritional needs for post-operative healing and would benefit from addition of oral nutrition supplements.  ? ?Medications reviewed and include senokot.  ? ?Labs reviewed: CBGS: 85.  ? ?Diet Order:   ?Diet Order   ? ?       ?  Diet NPO time specified  Diet effective midnight       ?  ? ?  ?  ? ?  ? ? ?EDUCATION NEEDS:  ? ?No education needs have been identified at this time ? ?Skin:  Skin Assessment: Skin Integrity Issues: ?Skin Integrity Issues:: Incisions ?Incisions: closed lt hip ? ?Last BM:  01/06/22 ? ?Height:  ? ?Ht Readings from  Last 1 Encounters:  ?01/07/22 5' (1.524 m)  ? ? ?Weight:  ? ?Wt Readings from Last 1 Encounters:  ?01/07/22 50.8 kg  ? ? ?Ideal Body Weight:  45.5 kg ? ?BMI:  Body mass index is 21.87 kg/m?. ? ?Estimated Nutritional Needs:  ? ?Kcal:  1300-1500 ? ?Protein:  65-80 grams ? ?Fluid:  > 1.3 L ? ? ? ?Loistine Chance, RD, LDN, CDCES ?Registered Dietitian II ?Certified Diabetes Care and Education Specialist ?Please refer to [facility] for RD and/or RD on-call/weekend/after hours pager  ?

## 2022-01-07 NOTE — TOC Progression Note (Signed)
Transition of Care (TOC) - Progression Note  ? ? ?Patient Details  ?Name: Stephanie Davidson ?MRN: [id number] ?Date of Birth: Mar 28, 1931 ? ?Transition of Care (TOC) CM/SW Contact  ?Conception Oms, RN ?Phone Number: ?01/07/2022, 3:31 PM ? ?Clinical Narrative:    ?Spoke with the daughter in the room and the plan is to go to [facility], she lives in ALF there.  Sent bedsearch to [facility] ? ? ?  ?  ? ?Expected Discharge Plan and Services ?  ?  ?  ?  ?  ?                ?  ?  ?  ?  ?  ?  ?  ?  ?  ?  ? ? ?Social Determinants of Health (SDOH) Interventions ?  ? ?Readmission Risk Interventions ?   ? View : No data to display.  ?  ?  ?  ? ? ?

## 2022-01-07 NOTE — Hospital Course (Signed)
Stephanie Davidson is an 88 yo female with PMH HTN, DLBCL of breast s/p chemo who presented after a mechanical fall at home.  She was attempting to turn around while standing in the kitchen and had tripped resulting in a fall. ?Imaging on admission showed displaced fracture involving the neck of proximal left femur. ?She underwent evaluation with orthopedic surgery and was recommended for surgical repair. ?

## 2022-01-07 NOTE — [place] FL2 (Signed)
?[place] MEDICAID FL2 LEVEL OF CARE SCREENING TOOL  ?  ? ?IDENTIFICATION  ?Patient Name: ?Stephanie Davidson Birthdate: 1931-07-09 Sex: female Admission Date (Current Location): ?01/06/2022  ?[place] and [place] Number: ? [place] ?  Facility and Address:  ?[facility], [address], [place], [place] [address] ?     Provider Number: ?[id number]  ?Attending Physician Name and Address:  ?Dwyane Dee, MD ? Relative Name and Phone Number:  ?[name] [phone] ?   ?Current Level of Care: ?Hospital Recommended Level of Care: ?[place] Prior Approval Number: ?  ? ?Date Approved/Denied: ?  PASRR Number: ?[id number] A ? ?Discharge Plan: ?SNF ?  ? ?Current Diagnoses: ?Patient Active Problem List  ? Diagnosis Date Noted  ? Left displaced femoral neck fracture ([place]) 01/06/2022  ? GERD (gastroesophageal reflux disease) 06/01/2019  ? Hyperlipidemia 06/01/2019  ? Hypertension 06/01/2019  ? Dementia ([place]) 06/01/2019  ? Encounter for antineoplastic chemotherapy 05/26/2019  ? Lymphoma of breast ([place]) 05/25/2019  ? ? ?Orientation RESPIRATION BLADDER Height & Weight   ?  ?Self, Place, Situation ? Normal Continent, External catheter Weight: 50.8 kg ?Height:  5' (152.4 cm)  ?BEHAVIORAL SYMPTOMS/MOOD NEUROLOGICAL BOWEL NUTRITION STATUS  ?    Continent Diet (see dc summary)  ?AMBULATORY STATUS COMMUNICATION OF NEEDS Skin   ?Extensive Assist Verbally Normal, Surgical wounds ?  ?  ?  ?    ?     ?     ? ? ?Personal Care Assistance Level of Assistance  ?Bathing, Dressing Bathing Assistance: Maximum assistance ?  ?Dressing Assistance: Limited assistance ?   ? ?Functional Limitations Info  ?    ?  ?   ? ? ?SPECIAL CARE FACTORS FREQUENCY  ?PT (By licensed PT), OT (By licensed OT)   ?  ?PT Frequency: 5 times per week ?OT Frequency: 5 times per week ?  ?  ?  ?   ? ? ?Contractures Contractures Info: Not present  ? ? ?Additional Factors Info  ?Code Status, Allergies Code Status Info: Full code ?Allergies  Info: latex ?  ?  ?  ?   ? ?Current Medications (01/07/2022):  This is the current hospital active medication list ?Current Facility-Administered Medications  ?Medication Dose Route Frequency Provider Last Rate Last Admin  ? [MAR Hold] amLODipine (NORVASC) tablet 10 mg  10 mg Oral Daily Agbata, Tochukwu, MD   10 mg at 01/07/22 0901  ? [MAR Hold] donepezil (ARICEPT) tablet 10 mg  10 mg Oral QHS Agbata, Tochukwu, MD   10 mg at 01/06/22 2133  ? [MAR Hold] hydrochlorothiazide (HYDRODIURIL) tablet 25 mg  25 mg Oral Daily Agbata, Tochukwu, MD      ? [QQV Hold] HYDROcodone-acetaminophen (NORCO/VICODIN) 5-325 MG per tablet 1-2 tablet  1-2 tablet Oral Q6H PRN Agbata, Tochukwu, MD   1 tablet at 01/07/22 0908  ? [MAR Hold] irbesartan (AVAPRO) tablet 300 mg  300 mg Oral Daily Agbata, Tochukwu, MD      ? [MAR Hold] lidocaine (LIDODERM) 5 % 1 patch  1 patch Transdermal Q24H Foust, Katy L, NP   1 patch at 01/06/22 2132  ? [MAR Hold] melatonin tablet 2.5 mg  2.5 mg Oral QHS PRN Agbata, Tochukwu, MD      ? [ZDG Hold] methocarbamol (ROBAXIN) tablet 500 mg  500 mg Oral Q6H PRN Agbata, Tochukwu, MD   500 mg at 01/07/22 0908  ? Or  ? [MAR Hold] methocarbamol (ROBAXIN) 500 mg in dextrose 5 % 50 mL IVPB  500 mg  Intravenous Q6H PRN Agbata, Tochukwu, MD      ? [facility] Hold] morphine (PF) 2 MG/ML injection 0.5 mg  0.5 mg Intravenous Q2H PRN Agbata, Tochukwu, MD   0.5 mg at 01/07/22 4503  ? [MAR Hold] morphine (PF) 2 MG/ML injection 0.5 mg  0.5 mg Intravenous Once Foust, Andrey Farmer, NP      ? [MAR Hold] morphine (PF) 4 MG/ML injection 4 mg  4 mg Intravenous Once Delman Kitten, MD      ? [facility] Hold] senna (SENOKOT) tablet 8.6 mg  1 tablet Oral BID Agbata, Tochukwu, MD   8.6 mg at 01/07/22 0901  ? ?Facility-Administered Medications Ordered in Other Encounters  ?Medication Dose Route Frequency Provider Last Rate Last Admin  ? bupivacaine(PF) (MARCAINE) 0.5 % injection   Intrathecal Anesthesia Intra-op Lerry Liner, CRNA   2.5 mL at 01/07/22 1225  ?  ceFAZolin (ANCEF) IVPB 2 g/50 mL premix   Intravenous Anesthesia Intra-op Lerry Liner, CRNA   2 g at 01/07/22 1226  ? ePHEDrine injection   Intravenous Anesthesia Intra-op Lerry Liner, CRNA   5 mg at 01/07/22 1322  ? fentaNYL (SUBLIMAZE) injection   Intravenous Anesthesia Intra-op Lerry Liner, CRNA   50 mcg at 01/07/22 1223  ? heparin lock flush 100 unit/mL  500 Units Intravenous Once Jacquelin Hawking, NP      ? phenylephrine (NEO-SYNEPHRINE) '20mg'$ /NS 269m premix infusion   Intravenous Continuous PRN LLerry Liner CRNA 37.5 mL/hr at 01/07/22 1244 50 mcg/min at 01/07/22 1244  ? phenylephrine (NEO-SYNEPHRINE) injection   Intravenous Anesthesia Intra-op LLerry Liner CRNA   100 mcg at 01/07/22 1245  ? propofol (DIPRIVAN) 10 mg/mL bolus/IV push   Intravenous Anesthesia Intra-op LLerry Liner CRNA   20 mg at 01/07/22 1227  ? propofol (DIPRIVAN) 500 MG/50ML infusion   Intravenous Continuous PRN LLerry Liner CRNA 13.716 mL/hr at 01/07/22 1254 45 mcg/kg/min at 01/07/22 1254  ? sodium chloride flush (NS) 0.9 % injection 10 mL  10 mL Intravenous PRN BJacquelin Hawking NP      ? tranexamic acid (CYKLOKAPRON) IVPB   Intravenous Anesthesia Intra-op LLerry Liner CRNA   1,000 mg at 01/07/22 1253  ? ? ? ?Discharge Medications: ?Please see discharge summary for a list of discharge medications. ? ?Relevant Imaging Results: ? ?Relevant Lab Results: ? ? ?Additional Information ?SS# [id number]? ?DConception Oms RN ? ? ? ? ?

## 2022-01-07 NOTE — Progress Notes (Signed)
PT Cancellation Note ? ?Patient Details ?Name: Stephanie Davidson ?MRN: [id number] ?DOB: 1931/06/28 ? ? ?Cancelled Treatment:    Reason Eval/Treat Not Completed: Other (comment) PT orders received, chart reviewed. Pt is noted to be POD0 following surgery for a traumatic injury. Per therapy protocol, will plan to evaluate tomorrow on POD1.  ? ?Lavone Nian, PT, DPT ?01/07/22, 3:54 PM ? ?Waunita Schooner ?01/07/2022, 3:53 PM ?

## 2022-01-07 NOTE — H&P (Signed)
H&P reviewed. No significant changes noted.  

## 2022-01-07 NOTE — Progress Notes (Signed)
?Progress Note ? ? ? ?[name]   ?KXF:[id number]  ?DOB: [birth year]  ?DOA: 01/06/2022     1 ?PCP: Derinda Late, MD ? ?Initial CC: fall at home ? ?Hospital Course: ?[name] is an 88 yo female with PMH HTN, DLBCL of breast s/p chemo who presented after a mechanical fall at home.  She was attempting to turn around while standing in the kitchen and had tripped resulting in a fall. ?Imaging on admission showed displaced fracture involving the neck of proximal left femur. ?She underwent evaluation with orthopedic surgery and was recommended for surgical repair. ? ?Interval History:  ?Seen in her room this morning prior to surgery.  Was complaining of cramping in her lower extremities. Otherwise as comfortable as could be.  ? ?Assessment and Plan: ?* Left displaced femoral neck fracture (HCC) ?- s/p mechanical fall ?-Imaging shows displaced left femoral neck fracture ?-Undergoing repair with orthopedic surgery on 01/07/2022 ?-PT/OT after surgery ?- Pain control and DVT prophylaxis after surgery as well ? ?Lymphoma of breast ([place]) ?Status post chemotherapy ?Follow-up with oncology as an outpatient ? ?Hypertension ?Blood pressure is stable ?Continue HCTZ, amlodipine and Avapro ? ?Dementia ([place]) ?Patient has mild cognitive deficits ?Continue Aricept ? ? ?Old records reviewed in assessment of this patient ? ?Antimicrobials: ? ? ?DVT prophylaxis:  ?SCDs Start: 01/06/22 1527 ? ? ?Code Status:   Code Status: Full Code ? ?Disposition Plan:  Probably to SNF ?Status is: Inpt ? ?Objective: ?Blood pressure (!) 127/58, pulse 78, temperature 98.1 ?F (36.7 ?C), temperature source Tympanic, resp. rate 20, height 5' (1.524 m), weight 50.8 kg, SpO2 91 %.  ?Examination:  ?Physical Exam ?Constitutional:   ?   General: She is not in acute distress. ?   Appearance: Normal appearance.  ?HENT:  ?   Head: Normocephalic and atraumatic.  ?   Mouth/Throat:  ?   Mouth: Mucous membranes are moist.  ?Eyes:  ?   Extraocular Movements: Extraocular  movements intact.  ?Cardiovascular:  ?   Rate and Rhythm: Normal rate and regular rhythm.  ?Pulmonary:  ?   Effort: Pulmonary effort is normal.  ?   Breath sounds: Normal breath sounds.  ?Abdominal:  ?   General: Bowel sounds are normal. There is no distension.  ?   Palpations: Abdomen is soft.  ?   Tenderness: There is no abdominal tenderness.  ?Musculoskeletal:  ?   Cervical back: Normal range of motion and neck supple.  ?   Comments: ROM testing deferred in LE due to pain  ?Skin: ?   General: Skin is warm and dry.  ?Neurological:  ?   Mental Status: She is alert and oriented to person, place, and time.  ?Psychiatric:     ?   Mood and Affect: Mood normal.     ?   Behavior: Behavior normal.  ?  ? ?Consultants:  ?Orthopedic surgery ? ?Procedures:  ? ? ?Data Reviewed: ?Results for orders placed or performed during the hospital encounter of 01/06/22 (from the past 24 hour(s))  ?CBC     Status: None  ? Collection Time: 01/06/22  2:08 PM  ?Result Value Ref Range  ? WBC 8.9 4.0 - 10.5 K/uL  ? RBC 4.16 3.87 - 5.11 MIL/uL  ? Hemoglobin 12.1 12.0 - 15.0 g/dL  ? HCT 38.7 36.0 - 46.0 %  ? MCV 93.0 80.0 - 100.0 fL  ? MCH 29.1 26.0 - 34.0 pg  ? MCHC 31.3 30.0 - 36.0 g/dL  ? RDW 14.6 11.5 -  15.5 %  ? Platelets 249 150 - 400 K/uL  ? nRBC 0.0 0.0 - 0.2 %  ?Basic metabolic panel     Status: Abnormal  ? Collection Time: 01/06/22  2:08 PM  ?Result Value Ref Range  ? Sodium 134 (L) 135 - 145 mmol/L  ? Potassium 5.1 3.5 - 5.1 mmol/L  ? Chloride 99 98 - 111 mmol/L  ? CO2 19 (L) 22 - 32 mmol/L  ? Glucose, Bld 178 (H) 70 - 99 mg/dL  ? BUN 31 (H) 8 - 23 mg/dL  ? Creatinine, Ser 1.13 (H) 0.44 - 1.00 mg/dL  ? Calcium 9.3 8.9 - 10.3 mg/dL  ? GFR, Estimated 47 (L) >60 mL/min  ? Anion gap 16 (H) 5 - 15  ?Protime-INR     Status: None  ? Collection Time: 01/06/22  4:22 PM  ?Result Value Ref Range  ? Prothrombin Time 12.9 11.4 - 15.2 seconds  ? INR 1.0 0.8 - 1.2  ?CBC     Status: Abnormal  ? Collection Time: 01/07/22  3:35 AM  ?Result Value Ref  Range  ? WBC 11.3 (H) 4.0 - 10.5 K/uL  ? RBC 4.10 3.87 - 5.11 MIL/uL  ? Hemoglobin 11.9 (L) 12.0 - 15.0 g/dL  ? HCT 37.9 36.0 - 46.0 %  ? MCV 92.4 80.0 - 100.0 fL  ? MCH 29.0 26.0 - 34.0 pg  ? MCHC 31.4 30.0 - 36.0 g/dL  ? RDW 14.3 11.5 - 15.5 %  ? Platelets 269 150 - 400 K/uL  ? nRBC 0.0 0.0 - 0.2 %  ?Basic metabolic panel     Status: Abnormal  ? Collection Time: 01/07/22  3:35 AM  ?Result Value Ref Range  ? Sodium 137 135 - 145 mmol/L  ? Potassium 4.5 3.5 - 5.1 mmol/L  ? Chloride 101 98 - 111 mmol/L  ? CO2 28 22 - 32 mmol/L  ? Glucose, Bld 114 (H) 70 - 99 mg/dL  ? BUN 27 (H) 8 - 23 mg/dL  ? Creatinine, Ser 0.69 0.44 - 1.00 mg/dL  ? Calcium 9.2 8.9 - 10.3 mg/dL  ? GFR, Estimated >60 >60 mL/min  ? Anion gap 8 5 - 15  ?MRSA Next Gen by PCR, Nasal     Status: None  ? Collection Time: 01/07/22  6:21 AM  ? Specimen: Nasal Mucosa; Nasal Swab  ?Result Value Ref Range  ? MRSA by PCR Next Gen NOT DETECTED NOT DETECTED  ?  ?I have Reviewed nursing notes, Vitals, and Lab results since pt's last encounter. Pertinent lab results : see above ?I have ordered test including BMP, CBC, Mg ?I have reviewed the last note from staff over past 24 hours ?I have discussed pt's care plan and test results with nursing staff, case manager ? ? LOS: 1 day  ? ?Dwyane Dee, MD ?[facility] ?01/07/2022, 12:38 PM ? ?

## 2022-01-07 NOTE — Transfer of Care (Signed)
Immediate Anesthesia Transfer of Care Note ? ?Patient: Stephanie Davidson ? ?Procedure(s) Performed: ARTHROPLASTY BIPOLAR HIP (HEMIARTHROPLASTY) (Left: Hip) ? ?Patient Location: PACU ? ?Anesthesia Type:MAC and Spinal ? ?Level of Consciousness: awake and confused ? ?Airway & Oxygen Therapy: Patient Spontanous Breathing and Patient connected to face mask oxygen ? ?Post-op Assessment: Report given to RN and Post -op Vital signs reviewed and stable ? ?Post vital signs: stable ? ?Last Vitals:  ?Vitals Value Taken Time  ?BP 93/71 01/07/22 1424  ?Temp    ?Pulse 88 01/07/22 1428  ?Resp 13 01/07/22 1428  ?SpO2 97 % 01/07/22 1428  ?Vitals shown include unvalidated device data. ? ?Last Pain:  ?Vitals:  ? 01/07/22 1103  ?TempSrc: Tympanic  ?PainSc: 0-No pain  ?   ? ?Patients Stated Pain Goal: 0 (01/07/22 1103) ? ?Complications: No notable events documented. ?

## 2022-01-08 ENCOUNTER — Encounter: Payer: Self-pay | Admitting: Orthopedic Surgery

## 2022-01-08 DIAGNOSIS — S72002A Fracture of unspecified part of neck of left femur, initial encounter for closed fracture: Secondary | ICD-10-CM | POA: Diagnosis not present

## 2022-01-08 LAB — CBC WITH DIFFERENTIAL/PLATELET
Abs Immature Granulocytes: 0.04 10*3/uL (ref 0.00–0.07)
Basophils Absolute: 0 10*3/uL (ref 0.0–0.1)
Basophils Relative: 0 %
Eosinophils Absolute: 0.2 10*3/uL (ref 0.0–0.5)
Eosinophils Relative: 2 %
HCT: 30.1 % — ABNORMAL LOW (ref 36.0–46.0)
Hemoglobin: 9.5 g/dL — ABNORMAL LOW (ref 12.0–15.0)
Immature Granulocytes: 1 %
Lymphocytes Relative: 8 %
Lymphs Abs: 0.6 10*3/uL — ABNORMAL LOW (ref 0.7–4.0)
MCH: 28.6 pg (ref 26.0–34.0)
MCHC: 31.6 g/dL (ref 30.0–36.0)
MCV: 90.7 fL (ref 80.0–100.0)
Monocytes Absolute: 0.5 10*3/uL (ref 0.1–1.0)
Monocytes Relative: 6 %
Neutro Abs: 6.4 10*3/uL (ref 1.7–7.7)
Neutrophils Relative %: 83 %
Platelets: 185 10*3/uL (ref 150–400)
RBC: 3.32 MIL/uL — ABNORMAL LOW (ref 3.87–5.11)
RDW: 14.4 % (ref 11.5–15.5)
WBC: 7.7 10*3/uL (ref 4.0–10.5)
nRBC: 0 % (ref 0.0–0.2)

## 2022-01-08 LAB — BASIC METABOLIC PANEL
Anion gap: 5 (ref 5–15)
BUN: 19 mg/dL (ref 8–23)
CO2: 27 mmol/L (ref 22–32)
Calcium: 8.5 mg/dL — ABNORMAL LOW (ref 8.9–10.3)
Chloride: 103 mmol/L (ref 98–111)
Creatinine, Ser: 0.6 mg/dL (ref 0.44–1.00)
GFR, Estimated: >60 mL/min (ref >60)
Glucose, Bld: 105 mg/dL — ABNORMAL HIGH (ref 70–99)
Potassium: 4 mmol/L (ref 3.5–5.1)
Sodium: 135 mmol/L (ref 135–145)

## 2022-01-08 LAB — MAGNESIUM: Magnesium: 2.2 mg/dL (ref 1.7–2.4)

## 2022-01-08 MED ORDER — ENOXAPARIN SODIUM 40 MG/0.4ML IJ SOSY: 40.0000 mg | PREFILLED_SYRINGE | INTRAMUSCULAR | 0 refills | Status: DC

## 2022-01-08 MED ORDER — TRAMADOL HCL 50 MG PO TABS: 50.0000 mg | ORAL_TABLET | Freq: Four times a day (QID) | ORAL | 0 refills | Status: DC | PRN

## 2022-01-08 MED ORDER — OXYCODONE HCL 5 MG PO TABS: 5.0000 mg | ORAL_TABLET | ORAL | 0 refills | Status: DC | PRN

## 2022-01-08 NOTE — Progress Notes (Signed)
[place] [facility] ? ? ?PATIENT NAME: Stephanie Davidson   ? ?MR#:  [id number] ? ?DATE OF BIRTH:  07/29/1931 ? ?SUBJECTIVE:  ? ?patient sitting out in the recliner. Postop day one did work with PT. No family at bedside. Spoke with daughter on the phone. No new issues per RN. ? ? ?VITALS:  ?Blood pressure 122/67, pulse 97, temperature 98.6 ?F (37 ?C), resp. rate 16, height 5' (1.524 m), weight 50.8 kg, SpO2 93 %. ? ?PHYSICAL EXAMINATION:  ? ?GENERAL:  88 y.o.-year-old patient lying in the bed with no acute distress.  ?LUNGS: Normal breath sounds bilaterally, no wheezing, rales, rhonchi.  ?CARDIOVASCULAR: S1, S2 normal. No murmurs, rubs, or gallops.  ?ABDOMEN: Soft, nontender, nondistended. Bowel sounds present.  ?EXTREMITIES: No  edema b/l.   Hip dressing + ?NEUROLOGIC: nonfocal  patient is alert and awake ?SKIN: No obvious rash, lesion, or ulcer.  ? ?LABORATORY PANEL:  ?CBC ?Recent Labs  ?Lab 01/08/22 ?0320  ?WBC 7.7  ?HGB 9.5*  ?HCT 30.1*  ?PLT 185  ? ? ?Chemistries  ?Recent Labs  ?Lab 01/08/22 ?0320  ?NA 135  ?K 4.0  ?CL 103  ?CO2 27  ?GLUCOSE 105*  ?BUN 19  ?CREATININE 0.60  ?CALCIUM 8.5*  ?MG 2.2  ? ?Cardiac Enzymes ?No results for input(s): TROPONINI in the last 168 hours. ?RADIOLOGY:  ?DG Pelvis Portable ? ?Result Date: 01/07/2022 ?CLINICAL DATA:  Status post LEFT hemiarthroplasty. EXAM: PORTABLE PELVIS 1-2 VIEWS COMPARISON:  Plain film from 01/06/2022. FINDINGS: Interval placement of LEFT hip arthroplasty hardware. Hardware appears intact and appropriately positioned. Osseous alignment is anatomic. Expected postsurgical changes within the overlying soft tissues. IMPRESSION: Status post LEFT hip arthroplasty. No evidence of surgical complicating feature. Electronically Signed   By: Franki Cabot M.D.   On: 01/07/2022 14:55  ? ?DG Pelvis Portable ? ?Result Date: 01/07/2022 ?CLINICAL DATA:  LEFT femoral neck fracture EXAM: PORTABLE PELVIS 1-2 VIEWS COMPARISON:  01/06/2022 FINDINGS:  Intraoperative image of the AP pelvis demonstrates prosthetic guide in the proximal LEFT femur. IMPRESSION: Intraoperative view demonstrates no complication. Electronically Signed   By: Suzy Bouchard M.D.   On: 01/07/2022 13:48   ? ?Assessment and Plan ? ?[name] is an 88 yo female with PMH HTN, DLBCL of breast s/p chemo who presented after a mechanical fall at home.  She was attempting to turn around while standing in the kitchen and had tripped resulting in a fall. ?Imaging on admission showed displaced fracture involving the neck of proximal left femur. ? ?Left displaced femoral neck fracture (HCC) ?- s/p mechanical fall ?-X ray shows displaced left femoral neck fracture ?-Undergoing repair with orthopedic surgery on 01/07/2022 ?-PT/OT after surgery ?- Pain control and DVT prophylaxis after surgery as well ?--POD #1--oob to chair. Doing overall well. D/c IVF ?  ?Lymphoma of breast ([place]) ?Status post chemotherapy ?Follow-up with oncology as an outpatient ?  ?Hypertension ?Blood pressure is stable ?Continue HCTZ, amlodipine and Avapro ?  ?Dementia ([place]) ?Patient has mild cognitive deficits ?Continue Aricept ? ? ? ?Procedures: left hip surgery ?Family communication :dter on the phone ?Consults :ortho Dr Posey Pronto ?CODE STATUS:  ?DVT Prophylaxis :Enoxaparin ?Level of care: Med-Surg ?Status is: Inpatient ?Remains inpatient appropriate because: postop day one left hip surgery. Anticipate discharge to [facility] tomorrow if continues to show improvement. Discussed with patient and daughter in agreement. TOC for discharge planning ?  ? ?TOTAL TIME TAKING CARE OF THIS PATIENT: 25 minutes.  ?>50% time spent on  counselling and coordination of care ? ?Note: This dictation was prepared with Dragon dictation along with smaller phrase technology. Any transcriptional errors that result from this process are unintentional. ? ?Fritzi Mandes M.D  ? ? ?[facility]  ? ?CC: ?Primary care physician; Derinda Late, MD  ?

## 2022-01-08 NOTE — Evaluation (Signed)
Occupational Therapy Evaluation ?Patient Details ?Name: Stephanie Davidson ?MRN: [id number] ?DOB: 1931-05-28 ?Today's Date: 01/08/2022 ? ? ?History of Present Illness Patient is a 88 year old female who sustained a displaced femoral neck fracture after a fall. s/p Left hip hemiarthroplasty. Medical history significant for  HTN, DLBCL of breast s/p chemo.  ? ?Clinical Impression ?  ?Pt seen for OT evaluation this date, POD#1 from above surgery. Pt received in recliner, pleasant, and agreeable to therapy. Reoriented to location and pt does endorse being forgetful. Pt is eager to return to PLOF with improved safety and independence. Pt currently requires  MAX  A for donning shoes and socks, MOD A for LB dressing otherwise, CGA for ADL & BSC transfers, and VC throughout for maintaining precautions. Pt instructed in maintaining precautions during ADL transfers and mobility/turning with RW requiring MAX VC for sequencing to maintain precautions. Pt unable to recall any posterior total hip precautions at start of session and unable to verbalize how to implement during ADL and mobility. Pt instructed in posterior total hip precautions and how to implement, self care skills, falls prevention strategies, home/routines modifications, DME/AE for LB bathing and dressing tasks, compression stocking mgt strategies, and ADL mobility. Handout provided to support recall and carryover. At end of session, pt able to recall 1/3 posterior total hip precautions with cues. Pt would benefit from additional instruction in self care skills and techniques to help maintain precautions with or without assistive devices to support recall and carryover prior to discharge. Recommend SNF upon discharge.    ? ?Recommendations for follow up therapy are one component of a multi-disciplinary discharge planning process, led by the attending physician.  Recommendations may be updated based on patient status, additional functional criteria and insurance  authorization.  ? ?Follow Up Recommendations ? Skilled nursing-short term rehab (<3 hours/day)  ?  ?Assistance Recommended at Discharge Frequent or constant Supervision/Assistance  ?Patient can return home with the following A little help with walking and/or transfers;A lot of help with walking and/or transfers;Assistance with cooking/housework;A lot of help with bathing/dressing/bathroom;Direct supervision/assist for medications management;Direct supervision/assist for financial management;Assist for transportation;Help with stairs or ramp for entrance ? ?  ?Functional Status Assessment ? Patient has had a recent decline in their functional status and demonstrates the ability to make significant improvements in function in a reasonable and predictable amount of time.  ?Equipment Recommendations ? Other (comment) (2WW)  ?  ?Recommendations for Other Services   ? ? ?  ?Precautions / Restrictions Precautions ?Precautions: Posterior Hip;Fall ?Precaution Booklet Issued: Yes (comment) ?Restrictions ?Weight Bearing Restrictions: Yes ?LLE Weight Bearing: Weight bearing as tolerated  ? ?  ? ?Mobility Bed Mobility ?  ?  ?  ?  ?  ?  ?  ?General bed mobility comments: not addressed as patient sitting up on arrival and post session ?  ? ?Transfers ?Overall transfer level: Needs assistance ?Equipment used: Rolling walker (2 wheels) ?Transfers: Sit to/from Stand ?Sit to Stand: Min guard ?  ?  ?  ?  ?  ?General transfer comment: VC for precautions and RW mgt ?  ? ?  ?Balance Overall balance assessment: Needs assistance ?Sitting-balance support: Feet supported ?Sitting balance-Leahy Scale: Fair ?  ?  ?Standing balance support: Bilateral upper extremity supported, Reliant on assistive device for balance ?Standing balance-Leahy Scale: Fair ?  ?  ?  ?  ?  ?  ?  ?  ?  ?  ?  ?  ?   ? ?ADL either performed  or assessed with clinical judgement  ? ?ADL   ?  ?  ?  ?  ?  ?  ?  ?  ?  ?  ?  ?  ?  ?  ?  ?  ?  ?  ?  ?General ADL Comments: Pt  currently requires MAX  A for donning shoes and socks, MOD A for LB dressing otherwise, CGA for ADL & BSC transfers, and VC throughout for maintaining precautions.  ? ? ? ?Vision   ?   ?   ?Perception   ?  ?Praxis   ?  ? ?Pertinent Vitals/Pain Pain Assessment ?Pain Assessment: No/denies pain  ? ? ? ?Hand Dominance   ?  ?Extremity/Trunk Assessment Upper Extremity Assessment ?Upper Extremity Assessment: Overall WFL for tasks assessed ?  ?Lower Extremity Assessment ?Lower Extremity Assessment: LLE deficits/detail ?LLE Deficits / Details: s/p L hip hemi ?LLE Sensation: WNL ?  ?  ?  ?Communication Communication ?Communication: No difficulties ?  ?Cognition Arousal/Alertness: Awake/alert ?Behavior During Therapy: [facility] for tasks assessed/performed ?Overall Cognitive Status: History of cognitive impairments - at baseline ?  ?  ?  ?  ?  ?  ?  ?  ?  ?  ?  ?  ?  ?  ?  ?  ?General Comments: Pt has poor recall of precautions requiring MAX VC for sequencing with ADL/mobility but is able to follow commands well ?  ?  ?General Comments  frequent cues are needed during functional mobility to maintain hip precautions ? ?  ?Exercises Other Exercises ?Other Exercises: Pt instructed in precautions and how to maintain during ADL with return demo + VC to support recall and carryover ?  ?Shoulder Instructions    ? ? ?Home Living Family/patient expects to be discharged to:: Assisted living ?  ?  ?  ?  ?  ?  ?  ?  ?  ?  ?  ?  ?  ?  ?Home Equipment: Shower seat ?  ?Additional Comments: patient has an alert button for emergency at [facility]. three meals are prepared for her also ?  ? ?  ?Prior Functioning/Environment Prior Level of Function : Needs assist ?  ?  ?  ?  ?  ?  ?Mobility Comments: independent, no assistive device ?ADLs Comments: has an aide that comes in 3 days per week to assist with bathing per daughter report, patient reports she is independent with sponge bathing otherwise ?  ? ?  ?  ?OT Problem List: Decreased  cognition;Decreased safety awareness;Impaired balance (sitting and/or standing);Decreased knowledge of use of DME or AE;Decreased knowledge of precautions;Decreased strength;Decreased range of motion ?  ?   ?OT Treatment/Interventions: Self-care/ADL training;Therapeutic exercise;Therapeutic activities;Cognitive remediation/compensation;DME and/or AE instruction;Patient/family education;Balance training  ?  ?OT Goals(Current goals can be found in the care plan section) Acute Rehab OT Goals ?Patient Stated Goal: get better and go home ?OT Goal Formulation: With patient ?Time For Goal Achievement: 01/22/22 ?Potential to Achieve Goals: Good ?ADL Goals ?Pt Will Perform Lower Body Dressing: with min assist;sit to/from stand;with caregiver independent in assisting ?Pt Will Transfer to Toilet: with supervision;ambulating (elevated commode, LRAD, maintaining posterior THPs w/ VC) ?Pt Will Perform Toileting - Clothing Manipulation and hygiene: with modified independence ?Additional ADL Goal #1: Pt will follow simple commands during ADL/mobility with VC to maintain precautions  ?OT Frequency: Min 2X/week ?  ? ?Co-evaluation   ?  ?  ?  ?  ? ?  ?AM-PAC OT "6 Clicks" Daily  Activity     ?Outcome Measure Help from another person eating meals?: None ?Help from another person taking care of personal grooming?: A Little ?Help from another person toileting, which includes using toliet, bedpan, or urinal?: A Little ?Help from another person bathing (including washing, rinsing, drying)?: A Lot ?Help from another person to put on and taking off regular upper body clothing?: A Little ?Help from another person to put on and taking off regular lower body clothing?: A Lot ?6 Click Score: 17 ?  ?End of Session Equipment Utilized During Treatment: Rolling walker (2 wheels);Gait belt ? ?Activity Tolerance: Patient tolerated treatment well ?Patient left: in chair;with call bell/phone within reach;with chair alarm set;Other (comment) (pillow  between BLE) ? ?OT Visit Diagnosis: Other abnormalities of gait and mobility (R26.89);Muscle weakness (generalized) (M62.81);Other symptoms and signs involving cognitive function  ?              ?Time: 6568-1275 ?OT Time Calculation (m

## 2022-01-08 NOTE — Evaluation (Addendum)
Physical Therapy Evaluation ?Patient Details ?Name: Stephanie Davidson ?MRN: [id number] ?DOB: [birth year] ?Today's Date: 01/08/2022 ? ?History of Present Illness ? Patient is a 88 year old female who sustained a displaced femoral neck fracture after a fall. s/p Left hip hemiarthroplasty. Medical history significant for  HTN, DLBCL of breast s/p chemo.  ?Clinical Impression ? Patient agreeable to PT. She was eager to go to the bathroom on arrival to the room and had a small bowel movement. She is from [facility] and was previous ambulatory without assistive device.  ?Patient needs frequent safety cues to maintain posterior hip precautions during functional mobility. Patient required minimal assistance with mobility, including standing from chair/BSC, and ambulation using rolling walker. She reports no pain after activity. Recommend to continue PT to maximize independence and facilitate return to prior level of function. Recommend SNF for short term rehab stay prior to return to ALF.  ?   ? ?Recommendations for follow up therapy are one component of a multi-disciplinary discharge planning process, led by the attending physician.  Recommendations may be updated based on patient status, additional functional criteria and insurance authorization. ? ?Follow Up Recommendations Skilled nursing-short term rehab (<3 hours/day) ? ?  ?Assistance Recommended at Discharge Frequent or constant Supervision/Assistance  ?Patient can return home with the following ? A lot of help with walking and/or transfers;A lot of help with bathing/dressing/bathroom;Assist for transportation;Help with stairs or ramp for entrance ? ?  ?Equipment Recommendations Rolling walker (2 wheels)  ?Recommendations for Other Services ?    ?  ?Functional Status Assessment Patient has had a recent decline in their functional status and demonstrates the ability to make significant improvements in function in a reasonable and predictable amount of time.  ? ?   ?Precautions / Restrictions Precautions ?Precautions: Posterior Hip;Fall ?Precaution Booklet Issued: Yes (comment) ?Restrictions ?Weight Bearing Restrictions: Yes ?LLE Weight Bearing: Weight bearing as tolerated  ? ?  ? ?Mobility ? Bed Mobility ?  ?  ?  ?  ?  ?  ?  ?General bed mobility comments: not addressed as patient sitting up on arrival and post session ?  ? ?Transfers ?Overall transfer level: Needs assistance ?Equipment used: Rolling walker (2 wheels) ?Transfers: Sit to/from Stand, Bed to chair/wheelchair/BSC ?Sit to Stand: Min assist ?  ?Step pivot transfers: Min assist ?  ?  ?  ?General transfer comment: verbal cues for hand placement and technique with transfers to maintain hip precuations with mobility ?  ? ?Ambulation/Gait ?Ambulation/Gait assistance: Min assist ?Gait Distance (Feet): 35 Feet ?Assistive device: Rolling walker (2 wheels) ?Gait Pattern/deviations: Step-to pattern, Step-through pattern, Narrow base of support ?Gait velocity: decreased ?  ?  ?General Gait Details: frequent cues to avoid internal rotation with turns. verbal cues for proper use of rolling walker. step to  pattern initially progressing to step through pattern ? ?Stairs ?  ?  ?  ?  ?  ? ?Wheelchair Mobility ?  ? ?Modified Rankin (Stroke Patients Only) ?  ? ?  ? ?Balance Overall balance assessment: Needs assistance ?Sitting-balance support: Feet supported ?Sitting balance-Leahy Scale: Fair ?  ?  ?Standing balance support: Bilateral upper extremity supported, Reliant on assistive device for balance ?Standing balance-Leahy Scale: Fair ?  ?  ?  ?  ?  ?  ?  ?  ?  ?  ?  ?  ?   ? ? ? ?Pertinent Vitals/Pain Pain Assessment ?Pain Assessment: No/denies pain (offered an ice pack for potential swelling/pain and patient declined)  ? ? ?  Home Living Family/patient expects to be discharged to:: Assisted living ?  ?  ?  ?  ?  ?  ?  ?  ?Home Equipment: Shower seat ?Additional Comments: patient has an alert button for emergency at [facility].  three meals are prepared for her also  ?  ?Prior Function Prior Level of Function : Needs assist ?  ?  ?  ?  ?  ?  ?Mobility Comments: independent, no assistive device ?ADLs Comments: has an aide that comes in 3 days per week to assist with bathing per daughter report, patient reports she is independent with sponge bathing otherwise ?  ? ? ?Hand Dominance  ?   ? ?  ?Extremity/Trunk Assessment  ? Upper Extremity Assessment ?Upper Extremity Assessment: Overall WFL for tasks assessed ?  ? ?Lower Extremity Assessment ?Lower Extremity Assessment: LLE deficits/detail (RLE WFL) ?LLE Deficits / Details: patient is able to activate hip/knee/ankle movement in gravity eliminated position. no knee buckling with weight bearing ?LLE Sensation: WNL ?  ? ?   ?Communication  ? Communication: No difficulties  ?Cognition Arousal/Alertness: Awake/alert ?Behavior During Therapy: [facility] for tasks assessed/performed ?Overall Cognitive Status: History of cognitive impairments - at baseline ?  ?  ?  ?  ?  ?  ?  ?  ?  ?  ?  ?  ?  ?  ?  ?  ?General Comments: patient is able to follow single step commands consistently. frequent safety cues to maintain hip precuations with functional mobility. daughter reports patient has mild dementia and forgets things, but does better with recall if written down (provided handout with posterior hip precuations and reviewed) ?  ?  ? ?  ?General Comments General comments (skin integrity, edema, etc.): frequent cues are needed during functional mobility to maintain hip precautions ? ?  ?Exercises    ? ?Assessment/Plan  ?  ?PT Assessment Patient needs continued PT services  ?PT Problem List Decreased strength;Decreased range of motion;Decreased activity tolerance;Decreased balance;Decreased mobility;Decreased cognition;Decreased safety awareness;Decreased knowledge of use of DME ? ?   ?  ?PT Treatment Interventions DME instruction;Gait training;Stair training;Functional mobility training;Balance  training;Therapeutic exercise;Therapeutic activities;Neuromuscular re-education;Cognitive remediation;Patient/family education   ? ?PT Goals (Current goals can be found in the Care Plan section)  ?Acute Rehab PT Goals ?Patient Stated Goal: to go to rehab ?PT Goal Formulation: With patient/family ?Time For Goal Achievement: 01/22/22 ?Potential to Achieve Goals: Good ? ?  ?Frequency BID ?  ? ? ?Co-evaluation   ?  ?  ?  ?  ? ? ?  ?AM-PAC PT "6 Clicks" Mobility  ?Outcome Measure Help needed turning from your back to your side while in a flat bed without using bedrails?: None ?Help needed moving from lying on your back to sitting on the side of a flat bed without using bedrails?: A Little ?Help needed moving to and from a bed to a chair (including a wheelchair)?: A Little ?Help needed standing up from a chair using your arms (e.g., wheelchair or bedside chair)?: A Little ?Help needed to walk in hospital room?: A Little ?Help needed climbing 3-5 steps with a railing? : A Lot ?6 Click Score: 18 ? ?  ?End of Session Equipment Utilized During Treatment: Gait belt ?Activity Tolerance: Patient tolerated treatment well ?Patient left: in chair;with call bell/phone within reach;with family/visitor present ?  ?PT Visit Diagnosis: Muscle weakness (generalized) (M62.81);Other abnormalities of gait and mobility (R26.89) ?  ? ?Time: 5462-7035 ?PT Time Calculation (min) (ACUTE ONLY): 38 min ? ? ?  Charges:   PT Evaluation ?$PT Eval Low Complexity: 1 Low ?PT Treatments ?$Gait Training: 8-22 mins ?  ?   ? ? ?Minna Merritts, PT, MPT ? ? ?Percell Locus ?01/08/2022, 12:20 PM ? ?

## 2022-01-08 NOTE — Discharge Instructions (Signed)
POSTERIOR TOTAL HIP REPLACEMENT POSTOPERATIVE DIRECTIONS ? ?Hip Rehabilitation, Guidelines Following Surgery  ?The results of a hip operation are greatly improved after range of motion and muscle strengthening exercises. Follow all safety measures which are given to protect your hip. If any of these exercises cause increased pain or swelling in your joint, decrease the amount until you are comfortable again. Then slowly increase the exercises. Call your caregiver if you have problems or questions.  ? ?HOME CARE INSTRUCTIONS  ?Remove items at home which could result in a fall. This includes throw rugs or furniture in walking pathways.  ?ICE to the affected hip every three hours for 30 minutes at a time and then as needed for pain and swelling.  Continue to use ice on the hip for pain and swelling from surgery. You may notice swelling that will progress down to the foot and ankle.  This is normal after surgery.  Elevate the leg when you are not up walking on it.   ?Continue to use the breathing machine which will help keep your temperature down.  It is common for your temperature to cycle up and down following surgery, especially at night when you are not up moving around and exerting yourself.  The breathing machine keeps your lungs expanded and your temperature down. ? ?DIET ?You may resume your previous home diet once your are discharged from the hospital. ? ?DRESSING / WOUND CARE / SHOWERING ?You may change your dressing 3-5 days after surgery.  Then change the dressing every day with sterile gauze.  Please use good hand washing techniques before changing the dressing.  Do not use any lotions or creams on the incision until instructed by your surgeon. ?You need to keep your dressing dry after discharge.   ?Change the surgical dressing if needed with Physical Therapy and reapply a dry dressing each time. ? ? ? ?ACTIVITY ?Walk with your walker as instructed. ?Use walker as long as suggested by your  caregivers. ?Avoid periods of inactivity such as sitting longer than an hour when not asleep. This helps prevent blood clots.  ?You may resume a sexual relationship in one month or when given the OK by your doctor.  ?You may return to work once you are cleared by your doctor.  ?Do not drive a car for 6 weeks or until released by you surgeon.  ?Do not drive while taking narcotics. ? ?WEIGHT BEARING ?Weight bearing as tolerated with assist device (walker, cane, etc) as directed, use it as long as suggested by your surgeon or therapist, typically at least 4-6 weeks. ? ?POSTOPERATIVE CONSTIPATION PROTOCOL ?Constipation - defined medically as fewer than three stools per week and severe constipation as less than one stool per week. ? ?One of the most common issues patients have following surgery is constipation.  Even if you have a regular bowel pattern at home, your normal regimen is likely to be disrupted due to multiple reasons following surgery.  Combination of anesthesia, postoperative narcotics, change in appetite and fluid intake all can affect your bowels.  In order to avoid complications following surgery, here are some recommendations in order to help you during your recovery period. ? ?Colace (docusate) - Pick up an over-the-counter form of Colace or another stool softener and take twice a day as long as you are requiring postoperative pain medications.  Take with a full glass of water daily.  If you experience loose stools or diarrhea, hold the colace until you stool forms back up.  If  your symptoms do not get better within 1 week or if they get worse, check with your doctor. ? ?Dulcolax (bisacodyl) - Pick up over-the-counter and take as directed by the product packaging as needed to assist with the movement of your bowels.  Take with a full glass of water.  Use this product as needed if not relieved by Colace only.  ? ?MiraLax (polyethylene glycol) - Pick up over-the-counter to have on hand.  MiraLax is a  solution that will increase the amount of water in your bowels to assist with bowel movements.  Take as directed and can mix with a glass of water, juice, soda, coffee, or tea.  Take if you go more than two days without a movement. ?Do not use MiraLax more than once per day. Call your doctor if you are still constipated or irregular after using this medication for 7 days in a row. ? ?If you continue to have problems with postoperative constipation, please contact the office for further assistance and recommendations.  If you experience "the worst abdominal pain ever" or develop nausea or vomiting, please contact the office immediatly for further recommendations for treatment. ? ?ITCHING ? If you experience itching with your medications, try taking only a single pain pill, or even half a pain pill at a time.  You can also use Benadryl over the counter for itching or also to help with sleep.  ? ?TED HOSE STOCKINGS ?Wear the elastic stockings on both legs for three weeks following surgery during the day but you may remove then at night for sleeping. ? ?MEDICATIONS ?See your medication summary on the ?After Visit Summary? that the nursing staff will review with you prior to discharge.  You may have some home medications which will be placed on hold until you complete the course of blood thinner medication.  It is important for you to complete the blood thinner medication as prescribed by your surgeon.  Continue your approved medications as instructed at time of discharge. ? ?PRECAUTIONS ?If you experience chest pain or shortness of breath - call 911 immediately for transfer to the hospital emergency department.  ?If you develop a fever greater that 101 F, purulent drainage from wound, increased redness or drainage from wound, foul odor from the wound/dressing, or calf pain - CONTACT YOUR SURGEON.   ?                                                ?FOLLOW-UP APPOINTMENTS ?Make sure you keep all of your appointments after  your operation with your surgeon and caregivers. You should call the office at the above phone number and make an appointment for approximately two weeks after the date of your surgery or on the date instructed by your surgeon outlined in the "After Visit Summary". ? ?RANGE OF MOTION AND STRENGTHENING EXERCISES  ?These exercises are designed to help you keep full movement of your hip joint. Follow your caregiver's or physical therapist's instructions. Perform all exercises about fifteen times, three times per day or as directed. Exercise both hips, even if you have had only one joint replacement. These exercises can be done on a training (exercise) mat, on the floor, on a table or on a bed. Use whatever works the best and is most comfortable for you. Use music or television while you are exercising so that the exercises  are a pleasant break in your day. This will make your life better with the exercises acting as a break in routine you can look forward to.  ?Lying on your back, slowly slide your foot toward your buttocks, raising your knee up off the floor. Then slowly slide your foot back down until your leg is straight again.  ?Lying on your back spread your legs as far apart as you can without causing discomfort.  ?Lying on your side, raise your upper leg and foot straight up from the floor as far as is comfortable. Slowly lower the leg and repeat.  ?Lying on your back, tighten up the muscle in the front of your thigh (quadriceps muscles). You can do this by keeping your leg straight and trying to raise your heel off the floor. This helps strengthen the largest muscle supporting your knee.  ?Lying on your back, tighten up the muscles of your buttocks both with the legs straight and with the knee bent at a comfortable angle while keeping your heel on the floor.  ? ? ? ? ?IF YOU ARE TRANSFERRED TO A SKILLED REHAB FACILITY ?If the patient is transferred to a skilled rehab facility following release from the  hospital, a list of the current medications will be sent to the facility for the patient to continue.  When discharged from the skilled rehab facility, please have the facility set up the patient's [place]

## 2022-01-08 NOTE — Plan of Care (Signed)

## 2022-01-08 NOTE — Progress Notes (Signed)
Physical Therapy Treatment ?Patient Details ?Name: Stephanie Davidson ?MRN: [id number] ?DOB: 12-16-1930 ?Today's Date: 01/08/2022 ? ? ?History of Present Illness Patient is a 88 year old female who sustained a displaced femoral neck fracture after a fall. s/p Left hip hemiarthroplasty. Medical history significant for  HTN, DLBCL of breast s/p chemo. ? ?  ?PT Comments  ? ? Patient seen for second PT session today. She is making progress with increased activity tolerance this session. Gait training performed in hallway, and patient able to walk around the nursing station with cues for gait kinematics. She then participated in progression of therapeutic exercises for strengthening LE. She continues to need cues for safety to maintain hip precautions with functional mobility. PT will continue to follow and SNF remains most appropriate discharge recommendation at this time.  ?  ?Recommendations for follow up therapy are one component of a multi-disciplinary discharge planning process, led by the attending physician.  Recommendations may be updated based on patient status, additional functional criteria and insurance authorization. ? ?Follow Up Recommendations ? Skilled nursing-short term rehab (<3 hours/day) ?  ?  ?Assistance Recommended at Discharge Frequent or constant Supervision/Assistance  ?Patient can return home with the following A lot of help with walking and/or transfers;A lot of help with bathing/dressing/bathroom;Assist for transportation;Help with stairs or ramp for entrance ?  ?Equipment Recommendations ? Rolling walker (2 wheels)  ?  ?Recommendations for Other Services   ? ? ?  ?Precautions / Restrictions Precautions ?Precautions: Posterior Hip;Fall ?Precaution Booklet Issued: Yes (comment) ?Restrictions ?Weight Bearing Restrictions: Yes ?LLE Weight Bearing: Weight bearing as tolerated  ?  ? ?Mobility ? Bed Mobility ?  ?  ?  ?  ?  ?  ?  ?General bed mobility comments: not addressed as patient sitting up on arrival  and post session ?  ? ?Transfers ?Overall transfer level: Needs assistance ?Equipment used: Rolling walker (2 wheels) ?Transfers: Sit to/from Stand ?Sit to Stand: Min guard ?  ?Step pivot transfers: Min assist ?  ?  ?  ?General transfer comment: cues for hand placement for safety and technique to maintain hip precuations with mobility ?  ? ?Ambulation/Gait ?Ambulation/Gait assistance: Min guard ?Gait Distance (Feet): 170 Feet ?Assistive device: Rolling walker (2 wheels) ?Gait Pattern/deviations: Step-through pattern, Decreased stride length ?Gait velocity: decreased ?  ?  ?General Gait Details: patient ambulated around the nursing station with rolling walker with Min guard for safety. cues and educated on technique to avoid internal rotation of LLE especially with turns. minimal left hip pain reported with mobility ? ? ?Stairs ?  ?  ?  ?  ?  ? ? ?Wheelchair Mobility ?  ? ?Modified Rankin (Stroke Patients Only) ?  ? ? ?  ?Balance Overall balance assessment: Needs assistance ?Sitting-balance support: Feet supported ?Sitting balance-Leahy Scale: Fair ?  ?  ?Standing balance support: Bilateral upper extremity supported, Reliant on assistive device for balance ?Standing balance-Leahy Scale: Fair ?  ?  ?  ?  ?  ?  ?  ?  ?  ?  ?  ?  ?  ? ?  ?Cognition Arousal/Alertness: Awake/alert ?Behavior During Therapy: [facility] for tasks assessed/performed ?Overall Cognitive Status: History of cognitive impairments - at baseline ?  ?  ?  ?  ?  ?  ?  ?  ?  ?  ?  ?  ?  ?  ?  ?  ?General Comments: patient thinks she is at [facility] initially bus is easily re-oriented. reveiwed all hip  precuations and patient needs frequent cues for safety to maintain with functional activity. she is pleasant and cooperative ?  ?  ? ?  ?Exercises General Exercises - Lower Extremity ?Ankle Circles/Pumps: AROM, Strengthening, Both, 10 reps, Seated ?Long Arc Quad: AAROM, AROM, Strengthening, Both, 10 reps, Seated (AROM RLE; AAROM LLE) ?Hip ABduction/ADduction:  AROM, Strengthening, Right, 10 reps, Supine ?Other Exercises ?Other Exercises: verbal cues for exercise technique within limits of posterior hip precuations for strengthening ? ?  ?General Comments General comments (skin integrity, edema, etc.): frequent cues are needed during functional mobility to maintain hip precautions ?  ?  ? ?Pertinent Vitals/Pain Pain Assessment ?Pain Assessment: Faces ?Faces Pain Scale: Hurts a little bit ?Pain Location: left hip with movement ?Pain Descriptors / Indicators: Sore ?Pain Intervention(s): Limited activity within patient's tolerance  ? ? ?Home Living Family/patient expects to be discharged to:: Assisted living ?  ?  ?  ?  ?  ?  ?  ?  ?Home Equipment: Shower seat ?Additional Comments: patient has an alert button for emergency at [facility]. three meals are prepared for her also  ?  ?Prior Function    ?  ?  ?   ? ?PT Goals (current goals can now be found in the care plan section) Acute Rehab PT Goals ?Patient Stated Goal: to go to rehab ?PT Goal Formulation: With patient ?Time For Goal Achievement: 01/22/22 ?Potential to Achieve Goals: Good ?Progress towards PT goals: Progressing toward goals ? ?  ?Frequency ? ? ? BID ? ? ? ?  ?PT Plan Current plan remains appropriate  ? ? ?Co-evaluation   ?  ?  ?  ?  ? ?  ?AM-PAC PT "6 Clicks" Mobility   ?Outcome Measure ? Help needed turning from your back to your side while in a flat bed without using bedrails?: None ?Help needed moving from lying on your back to sitting on the side of a flat bed without using bedrails?: A Little ?Help needed moving to and from a bed to a chair (including a wheelchair)?: A Little ?Help needed standing up from a chair using your arms (e.g., wheelchair or bedside chair)?: A Little ?Help needed to walk in hospital room?: A Little ?Help needed climbing 3-5 steps with a railing? : A Lot ?6 Click Score: 18 ? ?  ?End of Session Equipment Utilized During Treatment: Gait belt ?Activity Tolerance: Patient tolerated  treatment well ?Patient left: in chair;with call bell/phone within reach;with chair alarm set ?Nurse Communication: Mobility status (discussed with nurse aide) ?PT Visit Diagnosis: Muscle weakness (generalized) (M62.81);Other abnormalities of gait and mobility (R26.89) ?  ? ? ?Time: 8416-6063 ?PT Time Calculation (min) (ACUTE ONLY): 16 min ? ?Charges:  $Gait Training: 8-22 mins          ?          ?Minna Merritts, PT, MPT ? ? ? ?Percell Locus ?01/08/2022, 2:45 PM ? ?

## 2022-01-08 NOTE — Progress Notes (Signed)
?  Subjective: ?1 Day Post-Op Procedure(s) (LRB): ?ARTHROPLASTY BIPOLAR HIP (HEMIARTHROPLASTY) (Left) ?Patient reports pain as mild.   ?Patient is well, and has had no acute complaints or problems ?Plan is to go to [facility] assisted living versus rehab after hospital stay. ?Negative for chest pain and shortness of breath ?Fever: no ?Gastrointestinal: Negative for nausea and vomiting ? ?Objective: ?Vital signs in last 24 hours: ?Temp:  [97.9 ?F (36.6 ?C)-98.8 ?F (37.1 ?C)] 97.9 ?F (36.6 ?C) (04/18 0314) ?Pulse Rate:  [78-94] 85 (04/18 0314) ?Resp:  [12-20] 16 (04/18 0314) ?BP: (91-141)/(56-71) 131/61 (04/18 0314) ?SpO2:  [91 %-100 %] 98 % (04/18 0314) ?Weight:  [50.8 kg] 50.8 kg (04/17 1103) ? ?Intake/Output from previous day: ? ?Intake/Output Summary (Last 24 hours) at 01/08/2022 0720 ?Last data filed at 01/08/2022 0645 ?Gross per 24 hour  ?Intake 1040 ml  ?Output 2100 ml  ?Net -1060 ml  ?  ?Intake/Output this shift: ?No intake/output data recorded. ? ?Labs: ?Recent Labs  ?  01/06/22 ?1408 01/07/22 ?0335 01/08/22 ?0320  ?HGB 12.1 11.9* 9.5*  ? ?Recent Labs  ?  01/07/22 ?0335 01/08/22 ?0320  ?WBC 11.3* 7.7  ?RBC 4.10 3.32*  ?HCT 37.9 30.1*  ?PLT 269 185  ? ?Recent Labs  ?  01/07/22 ?0335 01/08/22 ?0320  ?NA 137 135  ?K 4.5 4.0  ?CL 101 103  ?CO2 28 27  ?BUN 27* 19  ?CREATININE 0.69 0.60  ?GLUCOSE 114* 105*  ?CALCIUM 9.2 8.5*  ? ?Recent Labs  ?  01/06/22 ?1622  ?INR 1.0  ? ? ? ?EXAM ?General - Patient is Alert and Oriented ?Extremity - Neurovascular intact ?Sensation intact distally ?Dorsiflexion/Plantar flexion intact ?Compartment soft ?Dressing/Incision - clean, dry, scant blood tinged drainage ?Motor Function - intact, moving foot and toes well on exam.  ? ?Past Medical History:  ?Diagnosis Date  ? Breast cancer ([place])   ? Hypertension   ? Personal history of chemotherapy   ? Personal history of radiation therapy   ? ? ?Assessment/Plan: ?1 Day Post-Op Procedure(s) (LRB): ?ARTHROPLASTY BIPOLAR HIP (HEMIARTHROPLASTY)  (Left) ?Principal Problem: ?  Left displaced femoral neck fracture ([place]) ?Active Problems: ?  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  Hypertension ?  Dementia ([place]) ? ?Estimated body mass index is 21.87 kg/m? as calculated from the following: ?  Height as of this encounter: 5' (1.524 m). ?  Weight as of this encounter: 50.8 kg. ?Advance diet ?Up with therapy ?D/C IV fluids ? ?Discharge planning to [facility] assisted living versus rehab. ?Plan to follow-up at [facility] clinic orthopedics in 2 weeks for staple removal and x-rays of the left hip ? ?DVT Prophylaxis - Lovenox, Foot Pumps, and TED hose ?Weight-Bearing as tolerated to left leg ? ?Reche Dixon, PA-C ?Orthopaedic Surgery ?01/08/2022, 7:20 AM ? ?

## 2022-01-09 DIAGNOSIS — S72002A Fracture of unspecified part of neck of left femur, initial encounter for closed fracture: Secondary | ICD-10-CM | POA: Diagnosis not present

## 2022-01-09 LAB — CBC WITH DIFFERENTIAL/PLATELET
Abs Immature Granulocytes: 0.05 10*3/uL (ref 0.00–0.07)
Basophils Absolute: 0 10*3/uL (ref 0.0–0.1)
Basophils Relative: 0 %
Eosinophils Absolute: 0.1 10*3/uL (ref 0.0–0.5)
Eosinophils Relative: 1 %
HCT: 30 % — ABNORMAL LOW (ref 36.0–46.0)
Hemoglobin: 10 g/dL — ABNORMAL LOW (ref 12.0–15.0)
Immature Granulocytes: 1 %
Lymphocytes Relative: 10 %
Lymphs Abs: 1 10*3/uL (ref 0.7–4.0)
MCH: 29.9 pg (ref 26.0–34.0)
MCHC: 33.3 g/dL (ref 30.0–36.0)
MCV: 89.6 fL (ref 80.0–100.0)
Monocytes Absolute: 0.4 10*3/uL (ref 0.1–1.0)
Monocytes Relative: 4 %
Neutro Abs: 8.4 10*3/uL — ABNORMAL HIGH (ref 1.7–7.7)
Neutrophils Relative %: 84 %
Platelets: 192 10*3/uL (ref 150–400)
RBC: 3.35 MIL/uL — ABNORMAL LOW (ref 3.87–5.11)
RDW: 14.3 % (ref 11.5–15.5)
WBC: 10 10*3/uL (ref 4.0–10.5)
nRBC: 0 % (ref 0.0–0.2)

## 2022-01-09 LAB — BASIC METABOLIC PANEL
Anion gap: 5 (ref 5–15)
BUN: 17 mg/dL (ref 8–23)
CO2: 27 mmol/L (ref 22–32)
Calcium: 8.8 mg/dL — ABNORMAL LOW (ref 8.9–10.3)
Chloride: 101 mmol/L (ref 98–111)
Creatinine, Ser: 0.58 mg/dL (ref 0.44–1.00)
GFR, Estimated: >60 mL/min (ref >60)
Glucose, Bld: 114 mg/dL — ABNORMAL HIGH (ref 70–99)
Potassium: 3.6 mmol/L (ref 3.5–5.1)
Sodium: 133 mmol/L — ABNORMAL LOW (ref 135–145)

## 2022-01-09 LAB — MAGNESIUM: Magnesium: 2.1 mg/dL (ref 1.7–2.4)

## 2022-01-09 LAB — SURGICAL PATHOLOGY

## 2022-01-09 MED ORDER — SENNA 8.6 MG PO TABS: 1.0000 | ORAL_TABLET | Freq: Two times a day (BID) | ORAL | 0 refills | Status: DC

## 2022-01-09 NOTE — Discharge Summary (Signed)
[place] at [facility] ? ? ?PATIENT NAME: Stephanie Davidson   ? ?MR#:  [id number] ? ?DATE OF BIRTH:  1930-12-05 ? ?DATE OF ADMISSION:  01/06/2022 ADMITTING PHYSICIAN: Stephanie Bullock, MD ? ?DATE OF DISCHARGE: 01/09/2022 ? ?PRIMARY CARE PHYSICIAN: Stephanie Late, MD  ? ? ?ADMISSION DIAGNOSIS:  ?Left displaced femoral neck fracture ([place]) [S72.002A] ?Closed left hip fracture, initial encounter ([place]) [S72.002A] ? ?DISCHARGE DIAGNOSIS:  ?left displaced femoral neck fracture status post ? ?HOSPITAL COURSE:  ? ?[name] is an 88 yo female with PMH HTN, DLBCL of breast s/p chemo who presented after a mechanical fall at home.  She was attempting to turn around while standing in the kitchen and had tripped resulting in a fall. ?Imaging on admission showed displaced fracture involving the neck of proximal left femur. ?  ?Left displaced femoral neck fracture (H[facility]) ?- s/p mechanical fall ?-X ray shows displaced left femoral neck fracture ?-Undergoing repair with orthopedic surgery on 01/07/2022 ?-PT/OT after surgery ?- Pain control and DVT prophylaxis after surgery as well ?--POD #1--oob to chair. Doing overall well. D/c IVF ?--POD #2 OOB to chair--overall doing well  ?-- Ortho aware of d/c plans today--informed ?  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  ?Hypertension ?Blood pressure is stable ?Continue HCTZ, amlodipine and Avapro ?  ?Dementia ([place]) ?Continue Aricept ?  ?  ?  ?Procedures: left hip surgery ?Family communication :dter on the phone 4/18--aware of d/c plans ?Consults :ortho Stephanie Davidson ?CODE STATUS:  ?DVT Prophylaxis :Enoxaparin ?Level of care: Med-Surg ? ?CONSULTS OBTAINED:  ?Treatment Team:  ?Stephanie Fabry, MD ? ?DISCHARGE MEDICATIONS:  ? ?Allergies as of 01/09/2022   ? ?   Reactions  ? Latex Rash  ? ?  ? ?  ?Medication List  ?  ? ?STOP taking these medications   ? ?lidocaine-prilocaine cream ?Commonly known as: EMLA ?  ?Multi-Vitamin  tablet ?  ? ?  ? ?TAKE these medications   ? ?amLODipine 10 MG tablet ?Commonly known as: NORVASC ?Take 1 tablet by mouth daily. ?  ?donepezil 10 MG tablet ?Commonly known as: ARICEPT ?Take 1 tablet by mouth at bedtime. ?  ?enoxaparin 40 MG/0.4ML injection ?Commonly known as: LOVENOX ?Inject 0.4 mLs (40 mg total) into the skin daily for 14 days. ?  ?fluticasone 50 MCG/ACT nasal spray ?Commonly known as: FLONASE ?Place 2 sprays into both nostrils daily. ?  ?hydrochlorothiazide 25 MG tablet ?Commonly known as: HYDRODIURIL ?Take 1 tablet by mouth daily. ?  ?irbesartan 300 MG tablet ?Commonly known as: AVAPRO ?Take 1 tablet by mouth daily. ?  ?melatonin 1 MG Tabs tablet ?Take by mouth. ?  ?meloxicam 15 MG tablet ?Commonly known as: MOBIC ?Take 1 tablet by mouth as needed. ?  ?oxyCODONE 5 MG immediate release tablet ?Commonly known as: Oxy IR/ROXICODONE ?Take 1 tablet (5 mg total) by mouth every 4 (four) hours as needed for moderate pain (pain score 4-6). ?  ?senna 8.6 MG Tabs tablet ?Commonly known as: SENOKOT ?Take 1 tablet (8.6 mg total) by mouth 2 (two) times daily. ?  ?traMADol 50 MG tablet ?Commonly known as: ULTRAM ?Take 1 tablet (50 mg total) by mouth every 6 (six) hours as needed for moderate pain. ?  ?traZODone 50 MG tablet ?Commonly known as: DESYREL ?Take 1 tablet by mouth at bedtime. ?  ? ?  ? ?  ?  ? ? ?  ?Discharge Care Instructions  ?(From admission, onward)  ?  ? ? ?  ? ?  Start     Ordered  ? 01/09/22 0000  Discharge wound care:       ?Comments: Reinforce dressing  As needed    ? 01/07/22 1528  ? 01/09/22 0813  ? ?  ?  ? ?  ? ? ?If you experience worsening of your admission symptoms, develop shortness of breath, life threatening emergency, suicidal or homicidal thoughts you must seek medical attention immediately by calling 911 or calling your MD immediately  if symptoms less severe. ? ?You Must read complete instructions/literature along with all the possible adverse reactions/side effects for all the  Medicines you take and that have been prescribed to you. Take any new Medicines after you have completely understood and accept all the possible adverse reactions/side effects.  ? ?Please note ? ?You were cared for by a hospitalist during your hospital stay. If you have any questions about your discharge medications or the care you received while you were in the hospital after you are discharged, you can call the unit and asked to speak with the hospitalist on call if the hospitalist that took care of you is not available. Once you are discharged, your primary care physician will handle any further medical issues. Please note that NO REFILLS for any discharge medications will be authorized once you are discharged, as it is imperative that you return to your primary care physician (or establish a relationship with a primary care physician if you do not have one) for your aftercare needs so that they can reassess your need for medications and monitor your lab values. ?Today  ? ?SUBJECTIVE  ? ?Doing well ? ?VITAL SIGNS:  ?Blood pressure (!) 150/60, pulse 100, temperature 97.9 ?F (36.6 ?C), resp. rate 18, height 5' (1.524 m), weight 50.8 kg, SpO2 92 %. ? ?I/O:  ? ?Intake/Output Summary (Last 24 hours) at 01/09/2022 0813 ?Last data filed at 01/08/2022 1500 ?Gross per 24 hour  ?Intake 1332.71 ml  ?Output --  ?Net 1332.71 ml  ? ? ?PHYSICAL EXAMINATION:  ?GENERAL:  88 y.o.-year-old patient lying in the bed with no acute distress.  ?LUNGS: Normal breath sounds bilaterally, no wheezing, rales,rhonchi or crepitation.  ?CARDIOVASCULAR: S1, S2 normal. No murmurs, rubs, or gallops.  ?ABDOMEN: Soft, non-tender, non-distended. Bowel sounds present.  ?EXTREMITIES: No  clubbing.  ?NEUROLOGIC: non-focal ?PSYCHIATRIC:  patient is alert and awake  ?SKIN: No obvious rash, lesion, or ulcer.  ? ?DATA REVIEW:  ? ?CBC  ?Recent Labs  ?Lab 01/09/22 ?0324  ?WBC 10.0  ?HGB 10.0*  ?HCT 30.0*  ?PLT 192  ? ? ?Chemistries  ?Recent Labs  ?Lab  01/09/22 ?0324  ?NA 133*  ?K 3.6  ?CL 101  ?CO2 27  ?GLUCOSE 114*  ?BUN 17  ?CREATININE 0.58  ?CALCIUM 8.8*  ?MG 2.1  ? ? ?Microbiology Results  ? ?Recent Results (from the past 240 hour(s))  ?MRSA Next Gen by PCR, Nasal     Status: None  ? Collection Time: 01/07/22  6:21 AM  ? Specimen: Nasal Mucosa; Nasal Swab  ?Result Value Ref Range Status  ? MRSA by PCR Next Gen NOT DETECTED NOT DETECTED Final  ?  Comment: (NOTE) ?The GeneXpert MRSA Assay (FDA approved for NASAL specimens only), ?is one component of a comprehensive MRSA colonization surveillance ?program. It is not intended to diagnose MRSA infection nor to guide ?or monitor treatment for MRSA infections. ?Test performance is not FDA approved in patients less than 2 years ?old. ?Performed at [facility], [place], ?  [place] [address] ?  ? ? ?RADIOLOGY:  ?DG Pelvis Portable ? ?Result Date: 01/07/2022 ?CLINICAL DATA:  Status post LEFT hemiarthroplasty. EXAM: PORTABLE PELVIS 1-2 VIEWS COMPARISON:  Plain film from 01/06/2022. FINDINGS: Interval placement of LEFT hip arthroplasty hardware. Hardware appears intact and appropriately positioned. Osseous alignment is anatomic. Expected postsurgical changes within the overlying soft tissues. IMPRESSION: Status post LEFT hip arthroplasty. No evidence of surgical complicating feature. Electronically Signed   By: Franki Cabot M.D.   On: 01/07/2022 14:55  ? ?DG Pelvis Portable ? ?Result Date: 01/07/2022 ?CLINICAL DATA:  LEFT femoral neck fracture EXAM: PORTABLE PELVIS 1-2 VIEWS COMPARISON:  01/06/2022 FINDINGS: Intraoperative image of the AP pelvis demonstrates prosthetic guide in the proximal LEFT femur. IMPRESSION: Intraoperative view demonstrates no complication. Electronically Signed   By: Suzy Bouchard M.D.   On: 01/07/2022 13:48   ? ? ?CODE STATUS:  ? ?  ?Code Status Orders  ?(From admission, onward)  ?  ? ? ?  ? ?  Start     Ordered  ? 01/06/22 1521  Full code  Continuous       ? 01/06/22  1527  ? ?  ?  ? ?  ? ?Code Status History   ? ? This patient has a current code status but no historical code status.  ? ?  ? ?Advance Directive Documentation   ? ?Flowsheet Row Most Recent Value  ?Type of Advance Dir

## 2022-01-09 NOTE — TOC Progression Note (Addendum)
Transition of Care (TOC) - Progression Note  ? ? ?Patient Details  ?Name: Stephanie Davidson ?MRN: [id number] ?Date of Birth: [birth year] ? ?Transition of Care (TOC) CM/SW Contact  ?Conception Oms, RN ?Phone Number: ?01/09/2022, 10:14 AM ? ?Clinical Narrative:    ? ?Patient going to room 118 at [facility] ?Nurse to call report to [phone] ?EMS called and arranged for transport ?I notified her daughter [name] ? ?  ?  ? ?Expected Discharge Plan and Services ?  ?  ?  ?  ?  ?Expected Discharge Date: 01/09/22               ?  ?  ?  ?  ?  ?  ?  ?  ?  ?  ? ? ?Social Determinants of Health (SDOH) Interventions ?  ? ?Readmission Risk Interventions ?   ? View : No data to display.  ?  ?  ?  ? ? ?

## 2022-01-09 NOTE — Plan of Care (Signed)

## 2022-01-09 NOTE — Progress Notes (Signed)
?  Subjective: ?2 Days Post-Op Procedure(s) (LRB): ?ARTHROPLASTY BIPOLAR HIP (HEMIARTHROPLASTY) (Left) ?Patient reports pain as mild.  Confusion during the night. ?Patient is well, and has had no acute complaints or problems ?Plan is to go to [facility] short-term rehab after hospital stay. ?Negative for chest pain and shortness of breath ?Fever: no ?Gastrointestinal: Negative for nausea and vomiting ? ?Objective: ?Vital signs in last 24 hours: ?Temp:  [97.9 ?F (36.6 ?C)-98.6 ?F (37 ?C)] 97.9 ?F (36.6 ?C) (04/19 0522) ?Pulse Rate:  [88-100] 100 (04/19 0522) ?Resp:  [16-20] 18 (04/19 0522) ?BP: (122-150)/(50-67) 150/60 (04/19 0522) ?SpO2:  [92 %-95 %] 92 % (04/19 0522) ? ?Intake/Output from previous day: ? ?Intake/Output Summary (Last 24 hours) at 01/09/2022 0718 ?Last data filed at 01/08/2022 1500 ?Gross per 24 hour  ?Intake 1332.71 ml  ?Output --  ?Net 1332.71 ml  ?  ?Intake/Output this shift: ?No intake/output data recorded. ? ?Labs: ?Recent Labs  ?  01/06/22 ?1408 01/07/22 ?0335 01/08/22 ?0320 01/09/22 ?0324  ?HGB 12.1 11.9* 9.5* 10.0*  ? ?Recent Labs  ?  01/08/22 ?0320 01/09/22 ?7846  ?WBC 7.7 10.0  ?RBC 3.32* 3.35*  ?HCT 30.1* 30.0*  ?PLT 185 192  ? ?Recent Labs  ?  01/08/22 ?0320 01/09/22 ?9629  ?NA 135 133*  ?K 4.0 3.6  ?CL 103 101  ?CO2 27 27  ?BUN 19 17  ?CREATININE 0.60 0.58  ?GLUCOSE 105* 114*  ?CALCIUM 8.5* 8.8*  ? ?Recent Labs  ?  01/06/22 ?1622  ?INR 1.0  ? ? ? ?EXAM ?General - Patient is Alert and Oriented ?Extremity - Neurovascular intact ?Sensation intact distally ?Dorsiflexion/Plantar flexion intact ?Compartment soft ?Dressing/Incision - clean, dry, scant blood tinged drainage ?Motor Function - intact, moving foot and toes well on exam.  The patient ambulated 170 feet with physical therapy. ? ?Past Medical History:  ?Diagnosis Date  ? Breast cancer ([place])   ? Hypertension   ? Personal history of chemotherapy   ? Personal history of radiation therapy   ? ? ?Assessment/Plan: ?2 Days Post-Op Procedure(s)  (LRB): ?ARTHROPLASTY BIPOLAR HIP (HEMIARTHROPLASTY) (Left) ?Principal Problem: ?  Left displaced femoral neck fracture ([place]) ?Active Problems: ?  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  Hypertension ?  Dementia ([place]) ? ?Estimated body mass index is 21.87 kg/m? as calculated from the following: ?  Height as of this encounter: 5' (1.524 m). ?  Weight as of this encounter: 50.8 kg. ?Advance diet ?Up with therapy ?D/C IV fluids ? ?Discharge planning to [facility] assisted living versus rehab. ?Plan to follow-up at [facility] clinic orthopedics in 2 weeks for staple removal and x-rays of the left hip ? ?DVT Prophylaxis - Lovenox, Foot Pumps, and TED hose ?Weight-Bearing as tolerated to left leg ? ?Reche Dixon, PA-C ?Orthopaedic Surgery ?01/09/2022, 7:18 AM ? ?

## 2022-01-09 NOTE — Progress Notes (Signed)
?  Progress Note  ? ?Date: 01/08/2022 ? ?Patient Name: Stephanie Davidson        ?MRN#: [id number] ? ?Review the patient?s clinical findings supports the diagnosis of:  ? ?Acute blood loss anemia ? ? ? ? ?

## 2022-01-09 NOTE — Progress Notes (Signed)
Blood pressure (!) 126/55, pulse 89, temperature 97.7 ?F (36.5 ?C), resp. rate 16, height 5' (1.524 m), weight 50.8 kg, SpO2 96 %. ?IV removed site c/d/I pt transported via ems to [facility] report called and dc/ with all belongings .  ?

## 2022-01-09 NOTE — Care Management Important Message (Signed)
Important Message ? ?Patient Details  ?Name: Stephanie Davidson ?MRN: [id number] ?Date of Birth: 1931/02/10 ? ? ?Medicare Important Message Given:  N/A - LOS <3 / Initial given by admissions ? ? ? ? ?[name] ?01/09/2022, 9:18 AM ?

## 2022-01-10 DIAGNOSIS — I10 Essential (primary) hypertension: Secondary | ICD-10-CM | POA: Diagnosis not present

## 2022-01-10 DIAGNOSIS — G301 Alzheimer's disease with late onset: Secondary | ICD-10-CM

## 2022-01-10 DIAGNOSIS — S72002A Fracture of unspecified part of neck of left femur, initial encounter for closed fracture: Secondary | ICD-10-CM

## 2022-01-10 DIAGNOSIS — K219 Gastro-esophageal reflux disease without esophagitis: Secondary | ICD-10-CM | POA: Diagnosis not present

## 2022-01-10 DIAGNOSIS — G479 Sleep disorder, unspecified: Secondary | ICD-10-CM

## 2022-01-21 ENCOUNTER — Encounter: Payer: Self-pay | Admitting: Internal Medicine

## 2022-01-21 DIAGNOSIS — G479 Sleep disorder, unspecified: Secondary | ICD-10-CM | POA: Insufficient documentation

## 2022-01-24 ENCOUNTER — Inpatient Hospital Stay: Payer: Medicare Other | Attending: Hospice and Palliative Medicine

## 2022-01-24 DIAGNOSIS — C8330 Diffuse large B-cell lymphoma, unspecified site: Secondary | ICD-10-CM | POA: Insufficient documentation

## 2022-01-24 DIAGNOSIS — Z95828 Presence of other vascular implants and grafts: Secondary | ICD-10-CM

## 2022-01-24 DIAGNOSIS — Z452 Encounter for adjustment and management of vascular access device: Secondary | ICD-10-CM | POA: Diagnosis present

## 2022-01-24 MED ORDER — SODIUM CHLORIDE 0.9% FLUSH
10.0000 mL | Freq: Once | INTRAVENOUS | Status: AC
  Administered 2022-01-24: 10 mL via INTRAVENOUS
  Filled 2022-01-24: qty 10

## 2022-01-24 MED ORDER — HEPARIN SOD (PORK) LOCK FLUSH 100 UNIT/ML IV SOLN
500.0000 [IU] | Freq: Once | INTRAVENOUS | Status: AC
  Administered 2022-01-24: 500 [IU] via INTRAVENOUS
  Filled 2022-01-24: qty 5

## 2022-01-31 ENCOUNTER — Ambulatory Visit: Payer: Medicare Other | Admitting: Internal Medicine

## 2022-01-31 ENCOUNTER — Encounter: Payer: Self-pay | Admitting: Internal Medicine

## 2022-01-31 DIAGNOSIS — I10 Essential (primary) hypertension: Secondary | ICD-10-CM

## 2022-01-31 DIAGNOSIS — G479 Sleep disorder, unspecified: Secondary | ICD-10-CM

## 2022-01-31 DIAGNOSIS — F02A0 Dementia in other diseases classified elsewhere, mild, without behavioral disturbance, psychotic disturbance, mood disturbance, and anxiety: Secondary | ICD-10-CM

## 2022-01-31 DIAGNOSIS — S72002A Fracture of unspecified part of neck of left femur, initial encounter for closed fracture: Secondary | ICD-10-CM

## 2022-01-31 DIAGNOSIS — K5900 Constipation, unspecified: Secondary | ICD-10-CM

## 2022-01-31 NOTE — Assessment & Plan Note (Signed)
Has done exceptionally well since hemiarthroplasty ?Now adjusting here in [place] ?No longer has any pain so will stop the routine tylenol order ?

## 2022-01-31 NOTE — Assessment & Plan Note (Signed)
Either mild Alzheimer's or vascular dementia ?I expect she will do well here with the mild assistance that she needs (mostly med supervision  and meals) ?

## 2022-01-31 NOTE — Assessment & Plan Note (Signed)
Doing fairly well off the trazodone and on melatonin '5mg'$  daily ?

## 2022-01-31 NOTE — Assessment & Plan Note (Signed)
BP Readings from Last 3 Encounters:  ?01/31/22 127/72  ?01/09/22 (!) 126/55  ?10/24/21 (!) 151/65  ? ?Good control on amlodipine '5mg'$  and losartan '100mg'$  daily ?

## 2022-01-31 NOTE — Progress Notes (Signed)
? ?Subjective:  ? ? Patient ID: Stephanie Davidson, female    DOB: September 06, 1931, 88 y.o.   MRN: [id number] ? ?HPI ?Initial visit in assisted living apartment for follow up after hospital and rehab for hip fracture ? ?Has made a great adjustment ?"This is where I love it"----very happy here ?Hip is healed---walking well with the rollator, doing exercise classes, etc ?Does have assist 3 days per week for shower ?Has AM assist for dressing as well ?No pain now---asks to stop the tylenol she was getting three times a day (will stop) ? ?Never a great sleeper but doing well here ?Had been on trazodone---but now melatonin '5mg'$  ? ?No chest pain or SOB ?No dizziness  ?BP fine off the HCTZ and on decreased amlodipine ?Avapro now changed to losartan ? ?Current Outpatient Medications on File Prior to Visit  ?Medication Sig Dispense Refill  ? amLODipine (NORVASC) 5 MG tablet Take 5 mg by mouth daily.    ? donepezil (ARICEPT) 10 MG tablet Take 1 tablet by mouth at bedtime.    ? fluticasone (FLONASE) 50 MCG/ACT nasal spray Place 2 sprays into both nostrils daily.    ? losartan (COZAAR) 100 MG tablet Take 100 mg by mouth daily.    ? melatonin 5 MG TABS Take 5 mg by mouth.    ? senna (SENOKOT) 8.6 MG TABS tablet Take 1 tablet (8.6 mg total) by mouth 2 (two) times daily. 120 tablet 0  ? ?Current Facility-Administered Medications on File Prior to Visit  ?Medication Dose Route Frequency Provider Last Rate Last Admin  ? heparin lock flush 100 unit/mL  500 Units Intravenous Once Faythe Casa E, NP      ? sodium chloride flush (NS) 0.9 % injection 10 mL  10 mL Intravenous PRN Jacquelin Hawking, NP      ? ? ?Allergies  ?Allergen Reactions  ? Latex Rash  ? ? ?Past Medical History:  ?Diagnosis Date  ? Alzheimer's dementia ([place])   ? Breast cancer ([place])   ? Lymphoma--- 2004 and again in 2020 (chemo and RT twice)  ? GERD (gastroesophageal reflux disease)   ? Hypertension   ? Personal history of chemotherapy   ? Personal history of radiation therapy    ? Sleep disturbance   ? ? ?Past Surgical History:  ?Procedure Laterality Date  ? BREAST BIOPSY Right ?  ? needle bx-benign  ? BREAST BIOPSY Left 05/12/2019  ? [place] bx 12:00, venus marker, DIFFUSE LARGE B CELL LYMPHOMA  ? BREAST EXCISIONAL BIOPSY Left 2003  ? lymphoma  ? BREAST EXCISIONAL BIOPSY Left ?  ? lump was removed, benign  ? HIP ARTHROPLASTY Left 01/07/2022  ? Procedure: ARTHROPLASTY BIPOLAR HIP (HEMIARTHROPLASTY);  Surgeon: Leim Fabry, MD;  Location: [facility] ORS;  Service: Orthopedics;  Laterality: Left;  ? IR IMAGING GUIDED PORT INSERTION  06/02/2019  ? ? ?Family History  ?Problem Relation Age of Onset  ? Heart attack Mother   ? Cancer Father   ? Motor neuron disease Brother   ? Breast cancer Neg Hx   ? ? ?Social History  ? ?Socioeconomic History  ? Marital status: Widowed  ?  Spouse name: Not on file  ? Number of children: 2  ? Years of education: Not on file  ? Highest education level: Not on file  ?Occupational History  ? Occupation: Bank Teller---retired  ?Tobacco Use  ? Smoking status: Never  ? Smokeless tobacco: Never  ?Vaping Use  ? Vaping Use: Never used  ?Substance and  Sexual Activity  ? Alcohol use: No  ? Drug use: Not on file  ? Sexual activity: Not on file  ?Other Topics Concern  ? Not on file  ?Social History Narrative  ? Widowed 2001  ? 1 daughter in [place]  ? 1 son in [place]---health care POA  ?   ? Would accept resuscitation  ? Prefers no feeding tube  ? ?Social Determinants of Health  ? ?Financial Resource Strain: Not on file  ?Food Insecurity: Not on file  ?Transportation Needs: Not on file  ?Physical Activity: Not on file  ?Stress: Not on file  ?Social Connections: Not on file  ?Intimate Partner Violence: Not on file  ? ?Review of Systems ?Appetite is good ?Bowels are moving fine with the senna ? ?   ?Objective:  ? Physical Exam ?Constitutional:   ?   Appearance: Normal appearance.  ?Cardiovascular:  ?   Rate and Rhythm: Normal rate and regular rhythm.  ?   Heart sounds:  ?  No gallop.  ?    Comments: Soft systolic murmur at base ?Pulmonary:  ?   Effort: Pulmonary effort is normal.  ?   Breath sounds: Normal breath sounds. No wheezing or rales.  ?Abdominal:  ?   Palpations: Abdomen is soft.  ?   Tenderness: There is no abdominal tenderness.  ?Musculoskeletal:  ?   Cervical back: Neck supple.  ?   Right lower leg: No edema.  ?   Left lower leg: No edema.  ?   Comments: Walks well with the rollator  ?Lymphadenopathy:  ?   Cervical: No cervical adenopathy.  ?Skin: ?   Findings: No rash.  ?Neurological:  ?   General: No focal deficit present.  ?   Mental Status: She is alert.  ?Psychiatric:     ?   Mood and Affect: Mood normal.     ?   Behavior: Behavior normal.  ?  ? ? ? ? ?   ?Assessment & Plan:  ? ?

## 2022-01-31 NOTE — Assessment & Plan Note (Signed)
Doing well with senna-s 1 bid ?

## 2022-04-24 ENCOUNTER — Encounter: Payer: Self-pay | Admitting: Internal Medicine

## 2022-04-24 ENCOUNTER — Inpatient Hospital Stay (HOSPITAL_BASED_OUTPATIENT_CLINIC_OR_DEPARTMENT_OTHER): Payer: Medicare Other | Admitting: Internal Medicine

## 2022-04-24 ENCOUNTER — Inpatient Hospital Stay: Payer: Medicare Other | Attending: Hospice and Palliative Medicine

## 2022-04-24 DIAGNOSIS — C8330 Diffuse large B-cell lymphoma, unspecified site: Secondary | ICD-10-CM | POA: Diagnosis present

## 2022-04-24 DIAGNOSIS — C8599 Non-Hodgkin lymphoma, unspecified, extranodal and solid organ sites: Secondary | ICD-10-CM | POA: Diagnosis not present

## 2022-04-24 DIAGNOSIS — Z95828 Presence of other vascular implants and grafts: Secondary | ICD-10-CM

## 2022-04-24 DIAGNOSIS — Z1231 Encounter for screening mammogram for malignant neoplasm of breast: Secondary | ICD-10-CM | POA: Diagnosis not present

## 2022-04-24 MED ORDER — HEPARIN SOD (PORK) LOCK FLUSH 100 UNIT/ML IV SOLN
500.0000 [IU] | Freq: Once | INTRAVENOUS | Status: AC
  Administered 2022-04-24: 500 [IU] via INTRAVENOUS
  Filled 2022-04-24: qty 5

## 2022-04-24 MED ORDER — SODIUM CHLORIDE 0.9% FLUSH
10.0000 mL | Freq: Once | INTRAVENOUS | Status: AC
  Administered 2022-04-24: 10 mL via INTRAVENOUS
  Filled 2022-04-24: qty 10

## 2022-04-24 NOTE — Progress Notes (Signed)
[facility] NOTE  Patient Care Team: Venia Carbon, MD as PCP - General (Internal Medicine) Cammie Sickle, MD as Consulting Physician (Oncology)  CHIEF COMPLAINTS/PURPOSE OF CONSULTATION: Breast lymphoma   Oncology History Overview Note  # AUG 2020- LEFT BREAST DLBCL [non-GCB subtype; Double expressor;NEG- CD-30; Ki-67-80-90%]; FISH -negative for translocations; SEP 2020- PET-left breast intense uptake; no distant disease noted.  Hold off bone marrow biopsy [patient/family preference not to be aggressive]; Stage adjusted IPI-"2 points" cure rate 50-60%.   # 9/21- Pred+Rituxan; OCT 2020- mini-R-CHOP; s/p radiation [feb 19th, 2021] -------------------------------------------------------------------------------------------- # 2006- DLBCL of Breast [s? 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    ;GOALS: Cure  CURRENT/MOST RECENT THERAPY:mini-RCHOP    Lymphoma of breast ([place])  05/25/2019 Initial Diagnosis   Lymphoma of breast ([place])   06/14/2019 -  Chemotherapy   The patient had DOXOrubicin (ADRIAMYCIN) chemo injection 36 mg, 25 mg/m2 = 36 mg (100 % of original dose 25 mg/m2), Intravenous,  Once, 4 of 4 cycles Dose modification: 25 mg/m2 (original dose 25 mg/m2, Cycle 2, Reason: Provider Judgment) Administration: 36 mg (07/05/2019), 36 mg (08/16/2019), 36 mg (07/26/2019), 36 mg (09/06/2019) palonosetron (ALOXI) injection 0.25 mg, 0.25 mg, Intravenous,  Once, 4 of 4 cycles Administration: 0.25 mg (07/05/2019), 0.25 mg (08/16/2019), 0.25 mg (07/26/2019), 0.25 mg (09/06/2019) pegfilgrastim-jmdb (FULPHILA) injection 6 mg, 6 mg, Subcutaneous,  Once, 4 of 4 cycles Administration: 6 mg (07/06/2019), 6 mg (08/17/2019), 6 mg (07/27/2019), 6 mg (09/07/2019) vinCRIStine (ONCOVIN) 2 mg in sodium chloride 0.9 % 50 mL chemo infusion, 2 mg,  Intravenous,  Once, 4 of 4 cycles Administration: 2 mg (07/05/2019), 2 mg (08/16/2019), 2 mg (07/26/2019), 2 mg (09/06/2019) riTUXimab (RITUXAN) 500 mg in sodium chloride 0.9 % 250 mL (1.6667 mg/mL) infusion, 375 mg/m2 = 500 mg, Intravenous,  Once, 1 of 1 cycle Administration: 500 mg (06/14/2019) cyclophosphamide (CYTOXAN) 580 mg in sodium chloride 0.9 % 250 mL chemo infusion, 400 mg/m2 = 580 mg (100 % of original dose 400 mg/m2), Intravenous,  Once, 4 of 4 cycles Dose modification: 600 mg/m2 (original dose 400 mg/m2, Cycle 2, Reason: Provider Judgment), 400 mg/m2 (original dose 400 mg/m2, Cycle 2, Reason: Provider Judgment) Administration: 580 mg (07/05/2019), 580 mg (08/16/2019), 580 mg (07/26/2019), 580 mg (09/06/2019) fosaprepitant (EMEND) 150 mg, dexamethasone (DECADRON) 12 mg in sodium chloride 0.9 % 145 mL IVPB, , Intravenous,  Once, 4 of 4 cycles Administration:  (07/05/2019),  (08/16/2019),  (07/26/2019),  (09/06/2019) riTUXimab-pvvr (RUXIENCE) 500 mg in sodium chloride 0.9 % 250 mL (1.6667 mg/mL) infusion, 375 mg/m2 = 500 mg (100 % of original dose 375 mg/m2), Intravenous,  Once, 1 of 1 cycle Dose modification: 375 mg/m2 (original dose 375 mg/m2, Cycle 2, Reason: Other (see comments), Comment: insurance)  for chemotherapy treatment.       HISTORY OF PRESENTING ILLNESS: Ambulating independently./Alone.  Stephanie Davidson 88 y.o.  female diffuse large B-cell lymphoma of the left breast stage I E/recurrent currently on mini R-CHOP-is here for follow-up.  In the interim patient had a hip fracture needing fixation in April 2023.  She denies any worsening breast pain.  Denies any worsening lumps or bumps. Denies any new lumps or bumps.  No headaches.  No nausea vomiting.  No fevers no chills.  Review of Systems  Constitutional:  Negative for chills, diaphoresis, fever and  malaise/fatigue.  HENT:  Negative for nosebleeds and sore throat.   Eyes:  Negative for double vision.  Respiratory:   Negative for cough, hemoptysis, sputum production, shortness of breath and wheezing.   Cardiovascular:  Negative for chest pain, palpitations, orthopnea and leg swelling.  Gastrointestinal:  Negative for abdominal pain, blood in stool, constipation, diarrhea, heartburn, melena, nausea and vomiting.  Genitourinary:  Negative for dysuria, frequency and urgency.  Musculoskeletal:  Positive for back pain and joint pain.  Skin: Negative.  Negative for itching and rash.  Neurological:  Negative for dizziness, tingling, focal weakness, weakness and headaches.  Endo/Heme/Allergies:  Does not bruise/bleed easily.  Psychiatric/Behavioral:  Positive for memory loss. Negative for depression. The patient is not nervous/anxious and does not have insomnia.      MEDICAL HISTORY:  Past Medical History:  Diagnosis Date   Alzheimer's dementia ([place])    Breast cancer ([place])    Lymphoma--- 2004 and again in 2020 (chemo and RT twice)   GERD (gastroesophageal reflux disease)    Hypertension    Personal history of chemotherapy    Personal history of radiation therapy    Sleep disturbance     SURGICAL HISTORY: Past Surgical History:  Procedure Laterality Date   BREAST BIOPSY Right ?   needle bx-benign   BREAST BIOPSY Left 05/12/2019   [place] bx 12:00, venus marker, DIFFUSE LARGE B CELL LYMPHOMA   BREAST EXCISIONAL BIOPSY Left 2003   lymphoma   BREAST EXCISIONAL BIOPSY Left ?   lump was removed, benign   HIP ARTHROPLASTY Left 01/07/2022   Procedure: ARTHROPLASTY BIPOLAR HIP (HEMIARTHROPLASTY);  Surgeon: Leim Fabry, MD;  Location: [facility] ORS;  Service: Orthopedics;  Laterality: Left;   IR IMAGING GUIDED PORT INSERTION  06/02/2019    SOCIAL HISTORY: Social History   Socioeconomic History   Marital status: Widowed    Spouse name: Not on file   Number of children: 2   Years of education: Not on file   Highest education level: Not on file  Occupational History   Occupation: Bank Teller---retired  Tobacco  Use   Smoking status: Never   Smokeless tobacco: Never  Vaping Use   Vaping Use: Never used  Substance and Sexual Activity   Alcohol use: No   Drug use: Not on file   Sexual activity: Not on file  Other Topics Concern   Not on file  Social History Narrative   Widowed 2001   1 daughter in [place]   1 son in [place] care POA      Would accept resuscitation   Prefers no feeding tube   Social Determinants of Health   Financial Resource Strain: Low Risk  (06/01/2019)   Overall Financial Resource Strain (CARDIA)    Difficulty of Paying Living Expenses: Not hard at all  Food Insecurity: No Food Insecurity (06/01/2019)   Hunger Vital Sign    Worried About Running Out of Food in the Last Year: Never true    [place] in the Last Year: Never true  Transportation Needs: No Transportation Needs (06/01/2019)   PRAPARE - Hydrologist (Medical): No    Lack of Transportation (Non-Medical): No  Physical Activity: Sufficiently Active (06/01/2019)   Exercise Vital Sign    Days of Exercise per Week: 7 days    Minutes of Exercise per Session: 30 min  Stress: No Stress Concern Present (06/01/2019)   [place]  of Stress : Only a little  Social Connections: Unknown (06/01/2019)   Social Connection and Isolation Panel [NHANES]    Frequency of Communication with Friends and Family: More than three times a week    Frequency of Social Gatherings with Friends and Family: More than three times a week    Attends Religious Services: Not on Advertising copywriter or Organizations: No    Attends Archivist Meetings: Never    Marital Status: Not on file  Intimate Partner Violence: Not At Risk (06/01/2019)   Humiliation, Afraid, Rape, and Kick questionnaire    Fear of Current or Ex-Partner: No    Emotionally Abused: No    Physically Abused: No    Sexually Abused: No     FAMILY HISTORY: Family History  Problem Relation Age of Onset   Heart attack Mother    Cancer Father    Motor neuron disease Brother    Breast cancer Neg Hx     ALLERGIES:  is allergic to latex.  MEDICATIONS:  Current Outpatient Medications  Medication Sig Dispense Refill   amLODipine (NORVASC) 5 MG tablet Take 5 mg by mouth daily.     donepezil (ARICEPT) 10 MG tablet Take 1 tablet by mouth at bedtime.     fluticasone (FLONASE) 50 MCG/ACT nasal spray Place 2 sprays into both nostrils daily.     losartan (COZAAR) 100 MG tablet Take 100 mg by mouth daily.     melatonin 5 MG TABS Take 5 mg by mouth.     senna (SENOKOT) 8.6 MG TABS tablet Take 1 tablet (8.6 mg total) by mouth 2 (two) times daily. 120 tablet 0   No current facility-administered medications for this visit.   Facility-Administered Medications Ordered in Other Visits  Medication Dose Route Frequency Provider Last Rate Last Admin   heparin lock flush 100 unit/mL  500 Units Intravenous Once Faythe Casa E, NP       sodium chloride flush (NS) 0.9 % injection 10 mL  10 mL Intravenous PRN Jacquelin Hawking, NP          .  PHYSICAL EXAMINATION: ECOG PERFORMANCE STATUS: 1 - Symptomatic but completely ambulatory  Vitals:   04/24/22 1320  BP: (!) 148/62  Pulse: 88  Temp: (!) 96.5 F (35.8 C)  SpO2: 99%    Filed Weights   04/24/22 1320  Weight: 110 lb 6.4 oz (50.1 kg)     Physical Exam Constitutional:      Comments: Accompanied by her daughter.  She is walking herself.  HENT:     Head: Normocephalic and atraumatic.     Mouth/Throat:     Pharynx: No oropharyngeal exudate.  Eyes:     Pupils: Pupils are equal, round, and reactive to light.  Cardiovascular:     Rate and Rhythm: Normal rate and regular rhythm.  Pulmonary:     Effort: Pulmonary effort is normal. No respiratory distress.     Breath sounds: Normal breath sounds. No wheezing.  Abdominal:     General: Bowel sounds are normal. There is no  distension.     Palpations: Abdomen is soft. There is no mass.     Tenderness: There is no abdominal tenderness. There is no guarding or rebound.  Musculoskeletal:        General: No tenderness. Normal range of motion.     Cervical back: Normal range of motion and neck supple.  Skin:    General: Skin is warm.  Comments: Left breast upper inner quadrant-no obvious masses noted.  Question scar tissue.  Tenderness noted no skin changes noted.  No nipple changes noted.  Neurological:     Mental Status: She is alert and oriented to person, place, and time.  Psychiatric:        Mood and Affect: Affect normal.      LABORATORY DATA:  I have reviewed the data as listed Lab Results  Component Value Date   WBC 10.0 01/09/2022   HGB 10.0 (L) 01/09/2022   HCT 30.0 (L) 01/09/2022   MCV 89.6 01/09/2022   PLT 192 01/09/2022   Recent Labs    10/24/21 1406 01/06/22 1408 01/07/22 0335 01/08/22 0320 01/09/22 0324  NA 134*   < > 137 135 133*  K 3.8   < > 4.5 4.0 3.6  CL 99   < > 101 103 101  CO2 25   < > _0 GLUCOSE 131*   < > 114* 105* 114*  BUN 29*   < > 27* 19 17  CREATININE 0.91   < > 0.69 0.60 0.58  CALCIUM 9.5   < > 9.2 8.5* 8.8*  GFRNONAA >60   < > >60 >60 >60  PROT 7.5  --   --   --   --   ALBUMIN 4.5  --   --   --   --   AST 21  --   --   --   --   ALT 13  --   --   --   --   ALKPHOS 124  --   --   --   --   BILITOT <0.1*  --   --   --   --    < > = values in this interval not displayed.    RADIOGRAPHIC STUDIES: I have personally reviewed the radiological images as listed and agreed with the findings in the report. No results found.  ASSESSMENT & PLAN:   Lymphoma of breast ([place]) # Diffuse large B-cell lymphoma left breast Non-GCB subtype; double expresser. Stage I E. S/p 4- R-mini CHOP; s/p consolidation radiation [finished Feb 19th 2021]. AUG 2021- PET scan-complete response noted in the left breast; slight uptake noted in the mediastinal lymph  nodes/clinically reactive.   March 2022-ultrasound/mammogram negative for any recurrent disease.  Clinically stable.  No worsening masses noted.  Monitor for now.  Patient overdue for mammogram currently; ordered today.  # Dementia: Stable continues to be fairly independent.  # port/IV access-stable.  Port flush today.  Discussed explantation; discussed pro and cons- pt's preference to keep it.  # DISPOSITION: # Mammogram Bil screening in 1-2 weeks # port flush in 3 months # follow up in 69mohd- MD; port flush- labs- cbc/cmp/LDH-Dr.B  Cc; Dr.Baboff   All questions were answered. The patient knows to call the clinic with any problems, questions or concerns.    GCammie Sickle MD 04/24/2022 1:35 PM

## 2022-04-24 NOTE — Assessment & Plan Note (Addendum)
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 Stage I E. S/p 4- R-mini CHOP; s/p consolidation radiation [finished Feb 19th 2021]. AUG 2021- PET scan-complete response noted in the left breast; slight uptake noted in the mediastinal lymph nodes/clinically reactive.   March 2022-ultrasound/mammogram negative for any recurrent disease.  Clinically stable.  No worsening masses noted.  Monitor for now.  Patient overdue for mammogram currently; ordered today.  # Dementia: Stable continues to be fairly independent.  # port/IV access-stable.  Port flush today.  Discussed explantation; discussed pro and cons- pt's preference to keep it.  # DISPOSITION: # Mammogram Bil screening in 1-2 weeks # port flush in 3 months # follow up in 17mohd- MD; port flush- labs- cbc/cmp/LDH-Dr.B  Cc; Dr.Baboff

## 2022-05-17 ENCOUNTER — Ambulatory Visit
Admission: RE | Admit: 2022-05-17 | Discharge: 2022-05-17 | Disposition: A | Discharge status: Home / Self Care | Payer: Medicare Other | Source: Ambulatory Visit | Attending: Internal Medicine | Admitting: Internal Medicine

## 2022-05-17 ENCOUNTER — Non-Acute Institutional Stay: Payer: Medicare Other | Admitting: Internal Medicine

## 2022-05-17 ENCOUNTER — Encounter: Payer: Self-pay | Admitting: Internal Medicine

## 2022-05-17 DIAGNOSIS — C8599 Non-Hodgkin lymphoma, unspecified, extranodal and solid organ sites: Secondary | ICD-10-CM | POA: Diagnosis not present

## 2022-05-17 DIAGNOSIS — F02A0 Dementia in other diseases classified elsewhere, mild, without behavioral disturbance, psychotic disturbance, mood disturbance, and anxiety: Secondary | ICD-10-CM

## 2022-05-17 DIAGNOSIS — E782 Mixed hyperlipidemia: Secondary | ICD-10-CM | POA: Diagnosis not present

## 2022-05-17 DIAGNOSIS — I10 Essential (primary) hypertension: Secondary | ICD-10-CM

## 2022-05-17 DIAGNOSIS — Z1231 Encounter for screening mammogram for malignant neoplasm of breast: Secondary | ICD-10-CM | POA: Insufficient documentation

## 2022-05-17 DIAGNOSIS — G479 Sleep disorder, unspecified: Secondary | ICD-10-CM | POA: Diagnosis not present

## 2022-05-17 NOTE — Progress Notes (Signed)
Subjective:    Patient ID: Stephanie Davidson, female    DOB: 10/29/1930, 87 y.o.   MRN: [id number]  HPI  Resident seen in [address] Reviewed with RN.  She gets assistant with bathing 3 times weekly and dressing daily.  Otherwise, she is independent with ADLs.  She walks with a rollator.  No recent falls.  Her appetite is good, weight has been stable.  She denies urinary incontinence.  Her bowels are moving well.  She denies chest pain or shortness of breath.  She reports intermittent joint pain, nothing persistent.  She reports her mood has been good.  HTN: Her BP today is 145/78.  She is taking Amlodipine and Losartan as prescribed.  ECG from 12/2021 reviewed.  HLD: Her last LDL was 133, triglycerides 109, 07/2021.  She is not currently on a statin medication.  Insomnia: Managed with melatonin nightly.  Dementia: Mild cognitive, stable functional needs.  She is taking Donepezil as prescribed.  Review of Systems  Past Medical History:  Diagnosis Date   Alzheimer's dementia ([place])    Breast cancer ([place])    Lymphoma--- 2004 and again in 2020 (chemo and RT twice)   GERD (gastroesophageal reflux disease)    Hypertension    Personal history of chemotherapy    Personal history of radiation therapy    Sleep disturbance     Current Outpatient Medications  Medication Sig Dispense Refill   amLODipine (NORVASC) 5 MG tablet Take 5 mg by mouth daily.     donepezil (ARICEPT) 10 MG tablet Take 1 tablet by mouth at bedtime.     fluticasone (FLONASE) 50 MCG/ACT nasal spray Place 2 sprays into both nostrils daily.     losartan (COZAAR) 100 MG tablet Take 100 mg by mouth daily.     melatonin 5 MG TABS Take 5 mg by mouth.     senna (SENOKOT) 8.6 MG TABS tablet Take 1 tablet (8.6 mg total) by mouth 2 (two) times daily. 120 tablet 0   No current facility-administered medications for this visit.   Facility-Administered Medications Ordered in Other Visits  Medication Dose Route Frequency Provider Last  Rate Last Admin   heparin lock flush 100 unit/mL  500 Units Intravenous Once Faythe Casa E, NP       sodium chloride flush (NS) 0.9 % injection 10 mL  10 mL Intravenous PRN Jacquelin Hawking, NP        Allergies  Allergen Reactions   Latex Rash    Family History  Problem Relation Age of Onset   Heart attack Mother    Cancer Father    Motor neuron disease Brother    Breast cancer Neg Hx     Social History   Socioeconomic History   Marital status: Widowed    Spouse name: Not on file   Number of children: 2   Years of education: Not on file   Highest education level: Not on file  Occupational History   Occupation: Bank Teller---retired  Tobacco Use   Smoking status: Never   Smokeless tobacco: Never  Vaping Use   Vaping Use: Never used  Substance and Sexual Activity   Alcohol use: No   Drug use: Not on file   Sexual activity: Not on file  Other Topics Concern   Not on file  Social History Narrative   Widowed 2001   1 daughter in [place]   1 son in [place] care POA      Would accept resuscitation  Prefers no feeding tube   Social Determinants of Health   Financial Resource Strain: Low Risk  (06/01/2019)   Overall Financial Resource Strain (CARDIA)    Difficulty of Paying Living Expenses: Not hard at all  Food Insecurity: No Food Insecurity (06/01/2019)   Hunger Vital Sign    Worried About Running Out of Food in the Last Year: Never true    Ran Out of Food in the Last Year: Never true  Transportation Needs: No Transportation Needs (06/01/2019)   PRAPARE - Hydrologist (Medical): No    Lack of Transportation (Non-Medical): No  Physical Activity: Sufficiently Active (06/01/2019)   Exercise Vital Sign    Days of Exercise per Week: 7 days    Minutes of Exercise per Session: 30 min  Stress: No Stress Concern Present (06/01/2019)   [place]    Feeling of Stress  : Only a little  Social Connections: Unknown (06/01/2019)   Social Connection and Isolation Panel [NHANES]    Frequency of Communication with Friends and Family: More than three times a week    Frequency of Social Gatherings with Friends and Family: More than three times a week    Attends Religious Services: Not on Advertising copywriter or Organizations: No    Attends Archivist Meetings: Never    Marital Status: Not on file  Intimate Partner Violence: Not At Risk (06/01/2019)   Humiliation, Afraid, Rape, and Kick questionnaire    Fear of Current or Ex-Partner: No    Emotionally Abused: No    Physically Abused: No    Sexually Abused: No     Constitutional: Denies fever, malaise, fatigue, headache or abrupt weight changes.  HEENT: Denies eye pain, eye redness, ear pain, ringing in the ears, wax buildup, runny nose, nasal congestion, bloody nose, or sore throat. Respiratory: Denies difficulty breathing, shortness of breath, cough or sputum production.   Cardiovascular: Denies chest pain, chest tightness, palpitations or swelling in the hands or feet.  Gastrointestinal: Denies abdominal pain, bloating, constipation, diarrhea or blood in the stool.  GU: Denies urgency, frequency, pain with urination, burning sensation, blood in urine, odor or discharge. Musculoskeletal: Patient reports intermittent joint pain.  Denies decrease in range of motion, difficulty with gait, muscle pain or joint swelling.  Skin: Denies redness, rashes, lesions or ulcercations.  Neurological: Patient reports insomnia.  Denies dizziness, difficulty with memory, difficulty with speech or problems with balance and coordination.  Psych: Denies anxiety, depression, SI/HI.  No other specific complaints in a complete review of systems (except as listed in HPI above).     Objective:   Physical Exam  BP (!) 145/78   Pulse 85   Resp 18   Wt 112 lb (50.8 kg)   SpO2 97%   BMI 21.87 kg/m  Wt Readings  from Last 3 Encounters:  05/17/22 112 lb (50.8 kg)  04/24/22 110 lb 6.4 oz (50.1 kg)  01/31/22 110 lb 6.4 oz (50.1 kg)    General: Appears her stated age, in NAD. Skin: Warm, dry and intact.  Neck:  Neck supple, trachea midline. No masses, lumps or thyromegaly present.  Cardiovascular: Normal rate and rhythm. S1,S2 noted.  Murmur noted. No JVD or BLE edema. Pulmonary/Chest: Normal effort and positive vesicular breath sounds. No respiratory distress. No wheezes, rales or ronchi noted.  Abdomen: Soft and nontender. Normal bowel sounds. Musculoskeletal: Gait not visualized. Neurological: Alert and  oriented.  Psychiatric: Appropriate  BMET    Component Value Date/Time   NA 133 (L) 01/09/2022 0324   NA 139 10/28/2012 0950   K 3.6 01/09/2022 0324   K 3.4 (L) 02/18/2013 1508   CL 101 01/09/2022 0324   CL 98 10/28/2012 0950   CO2 27 01/09/2022 0324   CO2 33 (H) 10/28/2012 0950   GLUCOSE 114 (H) 01/09/2022 0324   GLUCOSE 105 (H) 10/28/2012 0950   BUN 17 01/09/2022 0324   BUN 16 10/28/2012 0950   CREATININE 0.58 01/09/2022 0324   CREATININE 0.74 10/28/2012 0950   CALCIUM 8.8 (L) 01/09/2022 0324   CALCIUM 10.0 10/28/2012 0950   GFRNONAA >60 01/09/2022 0324   GFRNONAA >60 10/28/2012 0950   GFRAA >60 06/22/2020 1108   GFRAA >60 10/28/2012 0950    Lipid Panel  No results found for: "CHOL", "TRIG", "HDL", "CHOLHDL", "VLDL", "LDLCALC"  CBC    Component Value Date/Time   WBC 10.0 01/09/2022 0324   RBC 3.35 (L) 01/09/2022 0324   HGB 10.0 (L) 01/09/2022 0324   HGB 14.1 10/28/2012 0950   HCT 30.0 (L) 01/09/2022 0324   HCT 41.4 10/28/2012 0950   PLT 192 01/09/2022 0324   PLT 200 10/28/2012 0950   MCV 89.6 01/09/2022 0324   MCV 92 10/28/2012 0950   MCH 29.9 01/09/2022 0324   MCHC 33.3 01/09/2022 0324   RDW 14.3 01/09/2022 0324   RDW 13.6 10/28/2012 0950   LYMPHSABS 1.0 01/09/2022 0324   LYMPHSABS 1.4 10/28/2012 0950   MONOABS 0.4 01/09/2022 0324   MONOABS 0.4 10/28/2012 0950    EOSABS 0.1 01/09/2022 0324   EOSABS 0.2 10/28/2012 0950   BASOSABS 0.0 01/09/2022 0324   BASOSABS 0.0 10/28/2012 0950    Hgb A1C No results found for: "HGBA1C"          Assessment & Plan:    Webb Silversmith, NP

## 2022-05-17 NOTE — Assessment & Plan Note (Signed)
Controlled on amlodipine and losartan We will monitor

## 2022-05-17 NOTE — Assessment & Plan Note (Signed)
Continue donepezil Appreciate ALF care

## 2022-05-17 NOTE — Assessment & Plan Note (Signed)
Continue melatonin nightly

## 2022-05-17 NOTE — Assessment & Plan Note (Signed)
Not on statin therapy given age

## 2022-07-25 ENCOUNTER — Inpatient Hospital Stay: Payer: Medicare Other | Attending: Hospice and Palliative Medicine

## 2022-07-25 DIAGNOSIS — C8330 Diffuse large B-cell lymphoma, unspecified site: Secondary | ICD-10-CM | POA: Insufficient documentation

## 2022-07-25 DIAGNOSIS — Z95828 Presence of other vascular implants and grafts: Secondary | ICD-10-CM

## 2022-07-25 DIAGNOSIS — Z452 Encounter for adjustment and management of vascular access device: Secondary | ICD-10-CM | POA: Insufficient documentation

## 2022-07-25 MED ORDER — HEPARIN SOD (PORK) LOCK FLUSH 100 UNIT/ML IV SOLN
500.0000 [IU] | Freq: Once | INTRAVENOUS | Status: DC
  Filled 2022-07-25: qty 5

## 2022-07-25 MED ORDER — SODIUM CHLORIDE 0.9% FLUSH
10.0000 mL | Freq: Once | INTRAVENOUS | Status: DC
  Filled 2022-07-25: qty 10

## 2022-07-31 ENCOUNTER — Non-Acute Institutional Stay (SKILLED_NURSING_FACILITY): Payer: Medicare Other | Admitting: Student

## 2022-07-31 ENCOUNTER — Encounter: Payer: Self-pay | Admitting: Student

## 2022-07-31 DIAGNOSIS — K5900 Constipation, unspecified: Secondary | ICD-10-CM

## 2022-07-31 DIAGNOSIS — E782 Mixed hyperlipidemia: Secondary | ICD-10-CM

## 2022-07-31 DIAGNOSIS — F02A0 Dementia in other diseases classified elsewhere, mild, without behavioral disturbance, psychotic disturbance, mood disturbance, and anxiety: Secondary | ICD-10-CM | POA: Diagnosis not present

## 2022-07-31 DIAGNOSIS — S72002A Fracture of unspecified part of neck of left femur, initial encounter for closed fracture: Secondary | ICD-10-CM

## 2022-07-31 DIAGNOSIS — I10 Essential (primary) hypertension: Secondary | ICD-10-CM

## 2022-07-31 NOTE — Progress Notes (Signed)
Location:   [facility] Room Number: 216-S Place of Service:  ALF [phone]) Provider:  Dr.Kamille Toomey    Patient Care Team: Dewayne Shorter, MD as PCP - General (Family Medicine) Cammie Sickle, MD as Consulting Physician (Oncology) Lauree Chandler, NP as Nurse Practitioner (Geriatric Medicine)  Extended Emergency Contact Information Primary Emergency Contact: Cardinali,Melissa Address: [address]          [place], [place] [address] Johnnette Litter of [place] Phone: [phone] Relation: Daughter Secondary Emergency Contact: Vaughn,Kim Address: [place], [place] [address] [place] of [place] Phone: [phone] Relation: Son  Code Status:  FULL CODE  Goals of care: Advanced Directive information    04/24/2022    1:16 PM  Advanced Directives  Does Patient Have a Medical Advance Directive? Yes  Type of Paramedic of [place];Living will     Chief Complaint  Patient presents with   Medical Management of Chronic Issues    Routine Visit.    Health Maintenance    Discuss the need for Dexa scan, and AWV.   Immunizations    Discuss the need for Tetanus vaccine, Pne vaccine, and Covid Booster.    HPI:  Pt is a 88 y.o. female seen today for medical management of chronic diseases.    She is from [place] and [place], most recently in [place] before moving to [place] 9 years ago. She has gone from independent living in two locations and now the assisted living facility. [place]   She loves living here with everyone. She is always busy with activities, she has food all the time and help if she needs it.   She gets help in the morning on the days she has bath as well because of her fear of falling. She gets some support with dressing.   She hasn't had a recent falls - one was here right after she moved here to [place]. Which was  when she fractured her leg. She has had a rollator only since the hip fracture 01/07/2022.   She had breast cancer years ago - still has her port in place and recently had it flushed.   She doesn't have memory concerns. She feels like she is better off than her neighbors here.   Last labs in April of this year, will order new ones   2022, November Fall, day of the week - Tuesday Past Medical History:  Diagnosis Date   Alzheimer's dementia ([place])    Breast cancer ([place])    Lymphoma--- 2004 and again in 2020 (chemo and RT twice)   GERD (gastroesophageal reflux disease)    Hypertension    Personal history of chemotherapy    Personal history of radiation therapy    Sleep disturbance    Past Surgical History:  Procedure Laterality Date   BREAST BIOPSY Right ?   needle bx-benign   BREAST BIOPSY Left 05/12/2019   [place] bx 12:00, venus marker, DIFFUSE LARGE B CELL LYMPHOMA   BREAST EXCISIONAL BIOPSY Left 2003   lymphoma   BREAST EXCISIONAL BIOPSY Left ?   lump was removed, benign   HIP ARTHROPLASTY Left 01/07/2022   Procedure: ARTHROPLASTY BIPOLAR HIP (HEMIARTHROPLASTY);  Surgeon: Leim Fabry, MD;  Location: [facility] ORS;  Service: Orthopedics;  Laterality: Left;   IR IMAGING GUIDED PORT INSERTION  06/02/2019  Allergies  Allergen Reactions   Latex Rash    Allergies as of 07/31/2022       Reactions   Latex Rash        Medication List        Accurate as of July 31, 2022 12:16 PM. If you have any questions, ask your nurse or doctor.          acetaminophen 500 MG tablet Commonly known as: TYLENOL Take 1,000 mg by mouth daily.   acetaminophen 325 MG tablet Commonly known as: TYLENOL Take 650 mg by mouth every 4 (four) hours as needed for moderate pain, mild pain or fever.   alum & mag hydroxide-simeth 200-200-20 MG/5 Susp Commonly known as: MAALOX/MYLANTA Apply topically every 4 (four) hours as needed. 2 tablespoons   amLODipine 5 MG tablet Commonly known as:  NORVASC Take 5 mg by mouth daily.   BENADRYL PO Take 1 capsule by mouth as needed.   benzonatate 100 MG capsule Commonly known as: TESSALON Take 100 mg by mouth 3 (three) times daily as needed for cough.   cetirizine 10 MG tablet Commonly known as: ZYRTEC Take 10 mg by mouth as needed for allergies.   Debrox 6.5 % OTIC solution Generic drug: carbamide peroxide Place 5 drops into both ears as needed.   donepezil 10 MG tablet Commonly known as: ARICEPT Take 1 tablet by mouth at bedtime.   fluticasone 50 MCG/ACT nasal spray Commonly known as: FLONASE Place 2 sprays into both nostrils daily.   Glucose 15 GM/32ML Gel Take 1 packet by mouth as needed.   Kaopectate 262 MG/15ML suspension Generic drug: bismuth subsalicylate Take 10 mLs by mouth as needed.   losartan 100 MG tablet Commonly known as: COZAAR Take 100 mg by mouth at bedtime.   melatonin 5 MG Tabs Take 5 mg by mouth at bedtime.   Milk of Magnesia 400 MG/5ML suspension Generic drug: magnesium hydroxide Take by mouth as needed for mild constipation. 2 tablespoons   Nystatin Powd by Does not apply route as needed.   ondansetron 4 MG tablet Commonly known as: ZOFRAN Take 4 mg by mouth as needed for nausea or vomiting.   senna 8.6 MG Tabs tablet Commonly known as: SENOKOT Take 1 tablet (8.6 mg total) by mouth 2 (two) times daily.   Tussin DM 10-100 MG/5ML liquid Generic drug: dextromethorphan-guaiFENesin Take by mouth every 4 (four) hours as needed for cough. 2 tablespoons        Review of Systems  All other systems reviewed and are negative.   Immunization History  Administered Date(s) Administered   Influenza, High Dose Seasonal PF 07/10/2022   Influenza-Unspecified 07/11/2021   Unspecified SARS-COV-2 Vaccination 04/24/2022   Zoster Recombinat (Shingrix) 12/24/2017, 04/07/2018   Pertinent  Health Maintenance Due  Topic Date Due   DEXA SCAN  Never done   INFLUENZA VACCINE  Completed       01/07/2022    8:39 PM 01/08/2022   10:00 AM 01/08/2022    7:30 PM 01/09/2022   11:32 AM 04/24/2022    1:19 PM  Fall Risk  Patient Fall Risk Level High fall risk High fall risk High fall risk High fall risk High fall risk   Functional Status Survey:    Vitals:   07/31/22 1159  BP: 135/65  Pulse: 75  Resp: 16  Temp: 97.7 F (36.5 C)  SpO2: 96%  Weight: 111 lb 3.2 oz (50.4 kg)  Height: 5' (1.524 m)   Body mass index is  21.72 kg/m. Physical Exam Constitutional:      Appearance: Normal appearance.  Cardiovascular:     Rate and Rhythm: Normal rate and regular rhythm.     Pulses: Normal pulses.     Heart sounds: Normal heart sounds.  Pulmonary:     Effort: Pulmonary effort is normal.  Abdominal:     General: Abdomen is flat. Bowel sounds are normal.     Palpations: Abdomen is soft.  Musculoskeletal:        General: No swelling or tenderness.  Skin:    General: Skin is warm and dry.     Capillary Refill: Capillary refill takes 2 to 3 seconds.  Neurological:     Mental Status: She is alert.     Comments: Uses rollator for ambulation  Psychiatric:        Mood and Affect: Mood normal.     Labs reviewed: Recent Labs    01/07/22 0335 01/08/22 0320 01/09/22 0324  NA 137 135 133*  K 4.5 4.0 3.6  CL 101 103 101  CO2 '28 27 27  '$ GLUCOSE 114* 105* 114*  BUN 27* 19 17  CREATININE 0.69 0.60 0.58  CALCIUM 9.2 8.5* 8.8*  MG  --  2.2 2.1   Recent Labs    10/24/21 1406  AST 21  ALT 13  ALKPHOS 124  BILITOT <0.1*  PROT 7.5  ALBUMIN 4.5   Recent Labs    10/24/21 1406 01/06/22 1408 01/07/22 0335 01/08/22 0320 01/09/22 0324  WBC 7.4   < > 11.3* 7.7 10.0  NEUTROABS 5.1  --   --  6.4 8.4*  HGB 11.9*   < > 11.9* 9.5* 10.0*  HCT 36.9   < > 37.9 30.1* 30.0*  MCV 90.9   < > 92.4 90.7 89.6  PLT 268   < > 269 185 192   < > = values in this interval not displayed.   No results found for: "TSH" No results found for: "HGBA1C" No results found for: "CHOL", "HDL",  "LDLCALC", "LDLDIRECT", "TRIG", "CHOLHDL"  Significant Diagnostic Results in last 30 days:  No results found.  Assessment/Plan 1. Mild dementia associated with other underlying disease, without behavioral disturbance, psychotic disturbance, mood disturbance, or anxiety ([place]) Patient is stable. Active in assisted living. Receives help for showering, some dressing. Alert and oriented to self. Fluent speech. Ambulatory with a rollator. Weight stable. Normal appetite. Continue donepezil 10 mg BID  2. Constipation, unspecified constipation type Normal bowel movements with daily senna. Patient apprehensive to discontinue given how stable she is, declined making medication PRN at this time.   3. Mixed hyperlipidemia Most recent lipid panel had normal LDL and HDL appropriately high.   4. Left displaced femoral neck fracture ([place]) Fracture on 01/07/2022. S/p fixation. Doing well. No longer in therapy. Rollator dependent.   5. Primary hypertension BP well-controlled on current regimen with norvasc and losartan. Will collect BMP and CBC this month.    Family/ staff Communication: Nursing updated  Labs/tests ordered:  CBC, BMP  Tomasa Rand, MD, [facility] [phone]

## 2022-08-01 LAB — BASIC METABOLIC PANEL
BUN: 21 (ref 4–21)
CO2: 28 — AB (ref 13–22)
Chloride: 103 (ref 99–108)
Creatinine: 0.6 (ref 0.5–1.1)
Glucose: 82
Potassium: 4.1 mEq/L (ref 3.5–5.1)
Sodium: 137 (ref 137–147)

## 2022-08-01 LAB — CBC AND DIFFERENTIAL
HCT: 38 (ref 36–46)
Hemoglobin: 12.2 (ref 12.0–16.0)
Neutrophils Absolute: 3294
Platelets: 293 10*3/uL (ref 150–400)
WBC: 5.8

## 2022-08-01 LAB — CBC: RBC: 4.32 (ref 3.87–5.11)

## 2022-08-01 LAB — COMPREHENSIVE METABOLIC PANEL
Calcium: 9.6 (ref 8.7–10.7)
eGFR: 85

## 2022-10-23 ENCOUNTER — Inpatient Hospital Stay (HOSPITAL_BASED_OUTPATIENT_CLINIC_OR_DEPARTMENT_OTHER): Payer: Medicare Other | Admitting: Internal Medicine

## 2022-10-23 ENCOUNTER — Inpatient Hospital Stay: Payer: Medicare Other | Attending: Hospice and Palliative Medicine

## 2022-10-23 VITALS — BP 145/79 | HR 83 | Temp 97.0°F | Resp 16 | Ht 60.0 in | Wt 120.0 lb

## 2022-10-23 DIAGNOSIS — C8599 Non-Hodgkin lymphoma, unspecified, extranodal and solid organ sites: Secondary | ICD-10-CM

## 2022-10-23 DIAGNOSIS — I10 Essential (primary) hypertension: Secondary | ICD-10-CM | POA: Insufficient documentation

## 2022-10-23 DIAGNOSIS — Z452 Encounter for adjustment and management of vascular access device: Secondary | ICD-10-CM | POA: Diagnosis not present

## 2022-10-23 DIAGNOSIS — Z923 Personal history of irradiation: Secondary | ICD-10-CM | POA: Diagnosis not present

## 2022-10-23 DIAGNOSIS — F02A0 Dementia in other diseases classified elsewhere, mild, without behavioral disturbance, psychotic disturbance, mood disturbance, and anxiety: Secondary | ICD-10-CM | POA: Diagnosis not present

## 2022-10-23 DIAGNOSIS — Z9221 Personal history of antineoplastic chemotherapy: Secondary | ICD-10-CM | POA: Insufficient documentation

## 2022-10-23 DIAGNOSIS — C8330 Diffuse large B-cell lymphoma, unspecified site: Secondary | ICD-10-CM | POA: Diagnosis present

## 2022-10-23 DIAGNOSIS — Z79899 Other long term (current) drug therapy: Secondary | ICD-10-CM | POA: Diagnosis not present

## 2022-10-23 LAB — COMPREHENSIVE METABOLIC PANEL
ALT: 11 U/L (ref 0–44)
AST: 20 U/L (ref 15–41)
Albumin: 4.1 g/dL (ref 3.5–5.0)
Alkaline Phosphatase: 124 U/L (ref 38–126)
Anion gap: 9 (ref 5–15)
BUN: 25 mg/dL — ABNORMAL HIGH (ref 8–23)
CO2: 24 mmol/L (ref 22–32)
Calcium: 8.9 mg/dL (ref 8.9–10.3)
Chloride: 101 mmol/L (ref 98–111)
Creatinine, Ser: 0.63 mg/dL (ref 0.44–1.00)
GFR, Estimated: >60 mL/min (ref >60)
Glucose, Bld: 130 mg/dL — ABNORMAL HIGH (ref 70–99)
Potassium: 3.9 mmol/L (ref 3.5–5.1)
Sodium: 134 mmol/L — ABNORMAL LOW (ref 135–145)
Total Bilirubin: 0.2 mg/dL — ABNORMAL LOW (ref 0.3–1.2)
Total Protein: 7.3 g/dL (ref 6.5–8.1)

## 2022-10-23 LAB — CBC WITH DIFFERENTIAL/PLATELET
Abs Immature Granulocytes: 0.04 10*3/uL (ref 0.00–0.07)
Basophils Absolute: 0 10*3/uL (ref 0.0–0.1)
Basophils Relative: 0 %
Eosinophils Absolute: 0.1 10*3/uL (ref 0.0–0.5)
Eosinophils Relative: 2 %
HCT: 37.4 % (ref 36.0–46.0)
Hemoglobin: 12.1 g/dL (ref 12.0–15.0)
Immature Granulocytes: 1 %
Lymphocytes Relative: 30 %
Lymphs Abs: 2.2 10*3/uL (ref 0.7–4.0)
MCH: 29.4 pg (ref 26.0–34.0)
MCHC: 32.4 g/dL (ref 30.0–36.0)
MCV: 91 fL (ref 80.0–100.0)
Monocytes Absolute: 0.5 10*3/uL (ref 0.1–1.0)
Monocytes Relative: 7 %
Neutro Abs: 4.3 10*3/uL (ref 1.7–7.7)
Neutrophils Relative %: 60 %
Platelets: 285 10*3/uL (ref 150–400)
RBC: 4.11 MIL/uL (ref 3.87–5.11)
RDW: 14.6 % (ref 11.5–15.5)
WBC: 7.2 10*3/uL (ref 4.0–10.5)
nRBC: 0 % (ref 0.0–0.2)

## 2022-10-23 LAB — LACTATE DEHYDROGENASE: LDH: 175 U/L (ref 98–192)

## 2022-10-23 MED ORDER — HEPARIN SOD (PORK) LOCK FLUSH 100 UNIT/ML IV SOLN
500.0000 [IU] | Freq: Once | INTRAVENOUS | Status: AC
  Administered 2022-10-23: 500 [IU] via INTRAVENOUS
  Filled 2022-10-23: qty 5

## 2022-10-23 MED ORDER — SODIUM CHLORIDE 0.9% FLUSH
10.0000 mL | Freq: Once | INTRAVENOUS | Status: AC
  Administered 2022-10-23: 10 mL via INTRAVENOUS
  Filled 2022-10-23: qty 10

## 2022-10-23 NOTE — Assessment & Plan Note (Addendum)
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 Stage I E. S/p 4- R-mini CHOP; s/p consolidation radiation [finished Feb 19th 2021]. AUG 2021- PET scan-complete response noted in the left breast; slight uptake noted in the mediastinal lymph nodes/clinically reactive.   AUG 2023-ultrasound/mammogram negative for any recurrent disease.  Clinically stable.  Will order mammogram August 2024.  # Dementia: Stable continues to be fairly independent.  # port/IV access-stable.  Port flush today.  Discussed explantation; discussed pro and cons- pt's preference to keep it.  # DISPOSITION: # port flush in 3 months # follow up in 9mohd- MD; port flush- labs- cbc/cmp/LDH-Dr.B  Cc; Dr.Baboff

## 2022-10-23 NOTE — Progress Notes (Signed)
[place] NOTE  Patient Care Team: Dewayne Shorter, MD as PCP - General (Family Medicine) Cammie Sickle, MD as Consulting Physician (Oncology) Lauree Chandler, NP as Nurse Practitioner (Geriatric Medicine)  CHIEF COMPLAINTS/PURPOSE OF CONSULTATION: Breast lymphoma   Oncology History Overview Note  # AUG 2020- LEFT BREAST DLBCL [non-GCB subtype; Double expressor;NEG- CD-30; Ki-67-80-90%]; FISH -negative for translocations; SEP 2020- PET-left breast intense uptake; no distant disease noted.  Hold off bone marrow biopsy [patient/family preference not to be aggressive]; Stage adjusted IPI-"2 points" cure rate 50-60%.   # 9/21- Pred+Rituxan; OCT 2020- mini-R-CHOP; s/p radiation [feb 19th, 2021] -------------------------------------------------------------------------------------------- # 2006- DLBCL of Breast [s? 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  Stephanie Davidson 89 y.o.  female diffuse large B-cell lymphoma of the left breast stage I E/recurrent currently on mini R-CHOP-is here for follow-up.  She denies any worsening breast pain.  Denies any worsening lumps or bumps. Denies any new lumps or bumps.  No headaches.  No nausea vomiting.  No fevers no chills.  Review of Systems  Constitutional:  Negative for chills, diaphoresis, fever and malaise/fatigue.  HENT:  Negative for nosebleeds and sore throat.   Eyes:  Negative for double vision.  Respiratory:  Negative for cough, hemoptysis,  sputum production, shortness of breath and wheezing.   Cardiovascular:  Negative for chest pain, palpitations, orthopnea and leg swelling.  Gastrointestinal:  Negative for abdominal pain, blood in stool, constipation, diarrhea, heartburn, melena, nausea and vomiting.  Genitourinary:  Negative for dysuria, frequency and urgency.  Musculoskeletal:  Positive for back pain and joint pain.  Skin: Negative.  Negative for itching and rash.  Neurological:  Negative for dizziness, tingling, focal weakness, weakness and headaches.  Endo/Heme/Allergies:  Does not bruise/bleed easily.  Psychiatric/Behavioral:  Positive for memory loss. Negative for depression. The patient is not nervous/anxious and does not have insomnia.      MEDICAL HISTORY:  Past Medical History:  Diagnosis Date   Alzheimer's dementia ([place])    Breast cancer ([place])    Lymphoma--- 2004 and again in 2020 (chemo and RT twice)   GERD (gastroesophageal reflux disease)    Hypertension    Personal history of chemotherapy    Personal history of radiation therapy    Sleep disturbance     SURGICAL HISTORY: Past Surgical History:  Procedure Laterality Date   BREAST BIOPSY Right ?   needle bx-benign   BREAST BIOPSY Left 05/12/2019   [place] bx 12:00, venus marker, DIFFUSE LARGE B CELL LYMPHOMA   BREAST EXCISIONAL BIOPSY Left 2003   lymphoma   BREAST EXCISIONAL BIOPSY Left ?   lump was removed, benign   HIP ARTHROPLASTY Left 01/07/2022   Procedure: ARTHROPLASTY BIPOLAR HIP (HEMIARTHROPLASTY);  Surgeon: Leim Fabry, MD;  Location: [facility] ORS;  Service: Orthopedics;  Laterality: Left;   IR IMAGING GUIDED PORT INSERTION  06/02/2019    SOCIAL HISTORY: Social History   Socioeconomic History   Marital status: Widowed    Spouse name: Not  on file   Number of children: 2   Years of education: Not on file   Highest education level: Not on file  Occupational History   Occupation: Bank Teller---retired  Tobacco Use   Smoking status: Never    Smokeless tobacco: Never  Vaping Use   Vaping Use: Never used  Substance and Sexual Activity   Alcohol use: No   Drug use: Not on file   Sexual activity: Not on file  Other Topics Concern   Not on file  Social History Narrative   Widowed 2001   1 daughter in [place]   1 son in [place] care POA      Would accept resuscitation   Prefers no feeding tube   Social Determinants of Health   Financial Resource Strain: Low Risk  (06/01/2019)   Overall Financial Resource Strain (CARDIA)    Difficulty of Paying Living Expenses: Not hard at all  Food Insecurity: No Food Insecurity (06/01/2019)   Hunger Vital Sign    Worried About Running Out of Food in the Last Year: Never true    [place] in the Last Year: Never true  Transportation Needs: No Transportation Needs (06/01/2019)   PRAPARE - Hydrologist (Medical): No    Lack of Transportation (Non-Medical): No  Physical Activity: Sufficiently Active (06/01/2019)   Exercise Vital Sign    Days of Exercise per Week: 7 days    Minutes of Exercise per Session: 30 min  Stress: No Stress Concern Present (06/01/2019)   Comern­o    Feeling of Stress : Only a little  Social Connections: Unknown (06/01/2019)   Social Connection and Isolation Panel [NHANES]    Frequency of Communication with Friends and Family: More than three times a week    Frequency of Social Gatherings with Friends and Family: More than three times a week    Attends Religious Services: Not on Advertising copywriter or Organizations: No    Attends Archivist Meetings: Never    Marital Status: Not on file  Intimate Partner Violence: Not At Risk (06/01/2019)   Humiliation, Afraid, Rape, and Kick questionnaire    Fear of Current or Ex-Partner: No    Emotionally Abused: No    Physically Abused: No    Sexually Abused: No    FAMILY HISTORY: Family History   Problem Relation Age of Onset   Heart attack Mother    Cancer Father    Motor neuron disease Brother    Breast cancer Neg Hx     ALLERGIES:  is allergic to latex.  MEDICATIONS:  Current Outpatient Medications  Medication Sig Dispense Refill   acetaminophen (TYLENOL) 325 MG tablet Take 650 mg by mouth every 4 (four) hours as needed for moderate pain, mild pain or fever.     acetaminophen (TYLENOL) 500 MG tablet Take 1,000 mg by mouth daily.     alum & mag hydroxide-simeth (MAALOX/MYLANTA) 200-200-20 MG/5 SUSP Apply topically every 4 (four) hours as needed. 2 tablespoons     amLODipine (NORVASC) 5 MG tablet Take 5 mg by mouth daily.     benzonatate (TESSALON) 100 MG capsule Take 100 mg by mouth 3 (three) times daily as needed for cough.     bismuth subsalicylate (KAOPECTATE) 262 MG/15ML suspension Take 10 mLs by mouth as needed.     carbamide peroxide (DEBROX) 6.5 % OTIC solution Place 5 drops into  both ears as needed.     dextromethorphan-guaiFENesin (TUSSIN DM) 10-100 MG/5ML liquid Take by mouth every 4 (four) hours as needed for cough. 2 tablespoons     diphenhydrAMINE HCl (BENADRYL PO) Take 1 capsule by mouth as needed.     donepezil (ARICEPT) 10 MG tablet Take 1 tablet by mouth at bedtime.     fluticasone (FLONASE) 50 MCG/ACT nasal spray Place 2 sprays into both nostrils daily.     Glucose 15 GM/32ML GEL Take 1 packet by mouth as needed.     losartan (COZAAR) 100 MG tablet Take 100 mg by mouth at bedtime.     magnesium hydroxide (MILK OF MAGNESIA) 400 MG/5ML suspension Take by mouth as needed for mild constipation. 2 tablespoons     melatonin 5 MG TABS Take 5 mg by mouth at bedtime.     Nystatin POWD by Does not apply route as needed.     ondansetron (ZOFRAN) 4 MG tablet Take 4 mg by mouth as needed for nausea or vomiting.     senna (SENOKOT) 8.6 MG TABS tablet Take 1 tablet (8.6 mg total) by mouth 2 (two) times daily. 120 tablet 0   No current facility-administered medications  for this visit.   Facility-Administered Medications Ordered in Other Visits  Medication Dose Route Frequency Provider Last Rate Last Admin   heparin lock flush 100 unit/mL  500 Units Intravenous Once Faythe Casa E, NP       sodium chloride flush (NS) 0.9 % injection 10 mL  10 mL Intravenous PRN Jacquelin Hawking, NP          .  PHYSICAL EXAMINATION: ECOG PERFORMANCE STATUS: 1 - Symptomatic but completely ambulatory  Vitals:   10/23/22 1500  BP: (!) 145/79  Pulse: 83  Resp: 16  Temp: (!) 97 F (36.1 C)    Filed Weights   10/23/22 1500  Weight: 120 lb (54.4 kg)   Left BREAST exam [in the presence of nurse]- no unusual skin changes or dominant masses felt. Surgical scars noted.    Physical Exam HENT:     Head: Normocephalic and atraumatic.     Mouth/Throat:     Pharynx: No oropharyngeal exudate.  Eyes:     Pupils: Pupils are equal, round, and reactive to light.  Cardiovascular:     Rate and Rhythm: Normal rate and regular rhythm.  Pulmonary:     Effort: Pulmonary effort is normal. No respiratory distress.     Breath sounds: Normal breath sounds. No wheezing.  Abdominal:     General: Bowel sounds are normal. There is no distension.     Palpations: Abdomen is soft. There is no mass.     Tenderness: There is no abdominal tenderness. There is no guarding or rebound.  Musculoskeletal:        General: No tenderness. Normal range of motion.     Cervical back: Normal range of motion and neck supple.  Skin:    General: Skin is warm.  Neurological:     Mental Status: She is alert and oriented to person, place, and time.  Psychiatric:        Mood and Affect: Affect normal.      LABORATORY DATA:  I have reviewed the data as listed Lab Results  Component Value Date   WBC 7.2 10/23/2022   HGB 12.1 10/23/2022   HCT 37.4 10/23/2022   MCV 91.0 10/23/2022   PLT 285 10/23/2022   Recent Labs    10/24/21 1406 01/06/22  1408 01/08/22 0320 01/09/22 0324  10/23/22 1511  NA 134*   < > 135 133* 134*  K 3.8   < > 4.0 3.6 3.9  CL 99   < > 103 101 101  CO2 25   < > '27 27 24  '$ GLUCOSE 131*   < > 105* 114* 130*  BUN 29*   < > 19 17 25*  CREATININE 0.91   < > 0.60 0.58 0.63  CALCIUM 9.5   < > 8.5* 8.8* 8.9  GFRNONAA >60   < > >60 >60 >60  PROT 7.5  --   --   --  7.3  ALBUMIN 4.5  --   --   --  4.1  AST 21  --   --   --  20  ALT 13  --   --   --  11  ALKPHOS 124  --   --   --  124  BILITOT <0.1*  --   --   --  0.2*   < > = values in this interval not displayed.    RADIOGRAPHIC STUDIES: I have personally reviewed the radiological images as listed and agreed with the findings in the report. No results found.  ASSESSMENT & PLAN:   Lymphoma of breast ([place]) # Diffuse large B-cell lymphoma left breast Non-GCB subtype; double expresser. Stage I E. S/p 4- R-mini CHOP; s/p consolidation radiation [finished Feb 19th 2021]. AUG 2021- PET scan-complete response noted in the left breast; slight uptake noted in the mediastinal lymph nodes/clinically reactive.   AUG 2023-ultrasound/mammogram negative for any recurrent disease.  Clinically stable.  Will order mammogram August 2024.  # Dementia: Stable continues to be fairly independent.  # port/IV access-stable.  Port flush today.  Discussed explantation; discussed pro and cons- pt's preference to keep it.  # DISPOSITION: # port flush in 3 months # follow up in 46mohd- MD; port flush- labs- cbc/cmp/LDH-Dr.B  Cc; Dr.Baboff  All questions were answered. The patient knows to call the clinic with any problems, questions or concerns.    GCammie Sickle MD 10/23/2022 3:55 PM

## 2022-10-23 NOTE — Progress Notes (Signed)
Patient states that she does not have any concerns today.

## 2022-10-25 ENCOUNTER — Inpatient Hospital Stay: Payer: Medicare Other | Admitting: Internal Medicine

## 2022-10-25 ENCOUNTER — Inpatient Hospital Stay: Payer: Medicare Other

## 2022-11-07 ENCOUNTER — Encounter: Payer: Self-pay | Admitting: Nurse Practitioner

## 2022-11-07 ENCOUNTER — Non-Acute Institutional Stay: Payer: Medicare Other | Admitting: Nurse Practitioner

## 2022-11-07 DIAGNOSIS — K5900 Constipation, unspecified: Secondary | ICD-10-CM | POA: Diagnosis not present

## 2022-11-07 DIAGNOSIS — F02A0 Dementia in other diseases classified elsewhere, mild, without behavioral disturbance, psychotic disturbance, mood disturbance, and anxiety: Secondary | ICD-10-CM | POA: Diagnosis not present

## 2022-11-07 DIAGNOSIS — C8339 Diffuse large B-cell lymphoma, extranodal and solid organ sites: Secondary | ICD-10-CM

## 2022-11-07 DIAGNOSIS — I10 Essential (primary) hypertension: Secondary | ICD-10-CM | POA: Diagnosis not present

## 2022-11-07 DIAGNOSIS — E782 Mixed hyperlipidemia: Secondary | ICD-10-CM

## 2022-11-07 DIAGNOSIS — G479 Sleep disorder, unspecified: Secondary | ICD-10-CM | POA: Diagnosis not present

## 2022-11-07 DIAGNOSIS — M1991 Primary osteoarthritis, unspecified site: Secondary | ICD-10-CM

## 2022-11-07 NOTE — Progress Notes (Unsigned)
Location:  Other Nursing Home Room Number: 216S Place of Service:  ALF (13)  Dewayne Shorter, MD  Patient Care Team: Dewayne Shorter, MD as PCP - General (Family Medicine) Cammie Sickle, MD as Consulting Physician (Oncology) Lauree Chandler, NP as Nurse Practitioner (Geriatric Medicine)  Extended Emergency Contact Information Primary Emergency Contact: Cardinali,Melissa Address: [address], [place] [address] [address]net[address]itter of [place] Phone: [phone] Relation: Daughter Secondary Emergency Contact: Single,Kim Address: [place], [place] [address] Johnnet[address] Litter of [place] Phone: [phone] Relation: Son  Goals of care: Advanced Directive information    11/07/2022    2:37 PM  Advanced Directives  Does Patient Have a Medical Advance Directive? Yes  Type of Advance Directive Out of facility DNR (pink MOST or yellow form)  Does patient want to make changes to medical advance directive? No - Patient declined  Pre-existing out of facility DNR order (yellow form or pink MOST form) Pink MOST form placed in chart (order not valid for inpatient use)     Chief Complaint  Patient presents with   Medical Management of Chronic Issues    Routine follow up   Immunizations    Tdap vaccine Pneumonia vaccine and 7th COVID booster due   Quality Metric Gaps    Dexa scan and Medicare Annual wellness due    HPI:  Pt is a 89 y.o. female seen today for medical management of chronic disease. Pt is a resident of [facility].  Recently score a 9/30 on her [facility]- staff reports she does well in her routine.  She has no complaints today. Reports she is very active, walks a lot.  She will wake up early - around 5:30 am and will stay in bed until 6 She gets in bed at 9 am will go off to sleep quickly, she will wake up occasionally during the night.  No fatigue during the day.   Continues to follow up with oncology  due to lymphoma of breast.   Bowels stay regular, eats fruit to help regulate.   OA- no pain at this time.    Past Medical History:  Diagnosis Date   Alzheimer's dementia ([place])    Breast cancer ([place])    Lymphoma--- 2004 and again in 2020 (chemo and RT twice)   GERD (gastroesophageal reflux disease)    Hypertension    Personal history of chemotherapy    Personal history of radiation therapy    Sleep disturbance    Past Surgical History:  Procedure Laterality Date   BREAST BIOPSY Right ?   needle bx-benign   BREAST BIOPSY Left 05/12/2019   [place] bx 12:00, venus marker, DIFFUSE LARGE B CELL LYMPHOMA   BREAST EXCISIONAL BIOPSY Left 2003   lymphoma   BREAST EXCISIONAL BIOPSY Left ?   lump was removed, benign   HIP ARTHROPLASTY Left 01/07/2022   Procedure: ARTHROPLASTY BIPOLAR HIP (HEMIARTHROPLASTY);  Surgeon: Leim Fabry, MD;  Location: [facility] ORS;  Service: Orthopedics;  Laterality: Left;   IR IMAGING GUIDED PORT INSERTION  06/02/2019    Allergies  Allergen Reactions   Latex Rash    Outpatient Encounter Medications as of 11/07/2022  Medication Sig   acetaminophen (TYLENOL) 325 MG tablet Take 650 mg by mouth every 4 (four) hours as needed for moderate pain, mild pain or fever.   acetaminophen (TYLENOL) 500 MG tablet Take 1,000 mg  by mouth daily.   alum & mag hydroxide-simeth (MAALOX/MYLANTA) 200-200-20 MG/5 SUSP Apply topically every 4 (four) hours as needed. 2 tablespoons   amLODipine (NORVASC) 10 MG tablet Take 10 mg by mouth daily.   benzonatate (TESSALON) 100 MG capsule Take 100 mg by mouth 3 (three) times daily as needed for cough.   bismuth subsalicylate (KAOPECTATE) 262 MG/15ML suspension Take 10 mLs by mouth as needed.   carbamide peroxide (DEBROX) 6.5 % OTIC solution Place 5 drops into both ears as needed.   dextromethorphan-guaiFENesin (TUSSIN DM) 10-100 MG/5ML liquid Take by mouth every 4 (four) hours as needed for cough. 2 tablespoons   donepezil (ARICEPT) 10 MG tablet  Take 1 tablet by mouth at bedtime.   fluticasone (FLONASE) 50 MCG/ACT nasal spray Place 2 sprays into both nostrils daily.   Glucose 15 GM/32ML GEL Take 1 packet by mouth as needed.   losartan (COZAAR) 100 MG tablet Take 100 mg by mouth at bedtime.   magnesium hydroxide (MILK OF MAGNESIA) 400 MG/5ML suspension Take by mouth as needed for mild constipation. 2 tablespoons   melatonin 5 MG TABS Take 5 mg by mouth at bedtime.   Nystatin POWD by Does not apply route as needed.   ondansetron (ZOFRAN) 4 MG tablet Take 4 mg by mouth as needed for nausea or vomiting.   senna (SENOKOT) 8.6 MG TABS tablet Take 1 tablet (8.6 mg total) by mouth 2 (two) times daily.   [DISCONTINUED] diphenhydrAMINE HCl (BENADRYL PO) Take 1 capsule by mouth as needed.   Facility-Administered Encounter Medications as of 11/07/2022  Medication   heparin lock flush 100 unit/mL   sodium chloride flush (NS) 0.9 % injection 10 mL    Review of Systems  Constitutional:  Negative for activity change, appetite change, fatigue and unexpected weight change.  HENT:  Negative for congestion and hearing loss.   Eyes: Negative.   Respiratory:  Negative for cough and shortness of breath.   Cardiovascular:  Negative for chest pain, palpitations and leg swelling.  Gastrointestinal:  Negative for abdominal pain, constipation and diarrhea.  Genitourinary:  Negative for difficulty urinating and dysuria.  Musculoskeletal:  Negative for arthralgias and myalgias.  Skin:  Negative for color change and wound.  Neurological:  Negative for dizziness and weakness.  Psychiatric/Behavioral:  Positive for confusion. Negative for agitation and behavioral problems.      Immunization History  Administered Date(s) Administered   Influenza, High Dose Seasonal PF 07/10/2022   Influenza-Unspecified 07/11/2021   Pneumococcal Polysaccharide-23 01/26/2008, 03/14/2015   Unspecified SARS-COV-2 Vaccination 10/05/2019, 11/02/2019, 11/14/2020, 03/20/2021,  06/15/2021, 02/19/2022, 04/24/2022, 08/02/2022   Zoster Recombinat (Shingrix) 12/24/2017, 04/07/2018   Pertinent  Health Maintenance Due  Topic Date Due   DEXA SCAN  Never done   INFLUENZA VACCINE  Completed      01/07/2022    8:39 PM 01/08/2022   10:00 AM 01/08/2022    7:30 PM 01/09/2022   11:32 AM 04/24/2022    1:19 PM  Fall Risk  (RETIRED) Patient Fall Risk Level High fall risk High fall risk High fall risk High fall risk High fall risk   Functional Status Survey:    Vitals:   11/07/22 1338  BP: 138/68  Pulse: 80  Resp: 20  SpO2: 96%  Height: 5' (1.524 m)   Body mass index is 23.44 kg/m. Physical Exam Constitutional:      General: She is not in acute distress.    Appearance: She is well-developed. She is not diaphoretic.  HENT:  Head: Normocephalic and atraumatic.     Mouth/Throat:     Pharynx: No oropharyngeal exudate.  Eyes:     Conjunctiva/sclera: Conjunctivae normal.     Pupils: Pupils are equal, round, and reactive to light.  Cardiovascular:     Rate and Rhythm: Normal rate and regular rhythm.     Heart sounds: Normal heart sounds.  Pulmonary:     Effort: Pulmonary effort is normal.     Breath sounds: Normal breath sounds.  Abdominal:     General: Bowel sounds are normal.     Palpations: Abdomen is soft.  Musculoskeletal:     Cervical back: Normal range of motion and neck supple.     Right lower leg: No edema.     Left lower leg: No edema.  Skin:    General: Skin is warm and dry.  Neurological:     Mental Status: She is alert.  Psychiatric:        Mood and Affect: Mood normal.        Cognition and Memory: Cognition is impaired.     Labs reviewed: Recent Labs    01/08/22 0320 01/09/22 0324 10/23/22 1511  NA 135 133* 134*  K 4.0 3.6 3.9  CL 103 101 101  CO2 27 27 24  $ GLUCOSE 105* 114* 130*  BUN 19 17 25*  CREATININE 0.60 0.58 0.63  CALCIUM 8.5* 8.8* 8.9  MG 2.2 2.1  --    Recent Labs    10/23/22 1511  AST 20  ALT 11  ALKPHOS 124   BILITOT 0.2*  PROT 7.3  ALBUMIN 4.1   Recent Labs    01/08/22 0320 01/09/22 0324 10/23/22 1511  WBC 7.7 10.0 7.2  NEUTROABS 6.4 8.4* 4.3  HGB 9.5* 10.0* 12.1  HCT 30.1* 30.0* 37.4  MCV 90.7 89.6 91.0  PLT 185 192 285   No results found for: "TSH" No results found for: "HGBA1C" No results found for: "CHOL", "HDL", "LDLCALC", "LDLDIRECT", "TRIG", "CHOLHDL"  Significant Diagnostic Results in last 30 days:  No results found.  Assessment/Plan 1. Constipation, unspecified constipation type -well controlled with dietary modifications   2. . Diffuse large B-cell lymphoma of extranodal site excluding spleen and other solid organs [facility]) She has been stable, Followed and managed by oncology  3. Primary hypertension -Blood pressure well controlled, goal bp <140/90 Continue current medications and dietary modifications follow metabolic panel  4. Sleep disturbance overall stable, She reports she has never been able to sleep a lot or sleep in.   5. Mild dementia associated with other underlying disease, without behavioral disturbance, psychotic disturbance, mood disturbance, or anxiety ([place]) Doing well with her day to day- in AL to help with medication management. Will continue supportive care  6. Primary osteoarthritis, unspecified site -stable, continues on tylenol    Sivan Quast K. [place], [facility] [phone]

## 2022-11-12 DIAGNOSIS — C8339 Diffuse large B-cell lymphoma, extranodal and solid organ sites: Secondary | ICD-10-CM | POA: Insufficient documentation

## 2023-01-21 ENCOUNTER — Inpatient Hospital Stay: Payer: Medicare Other | Attending: Hospice and Palliative Medicine

## 2023-01-21 DIAGNOSIS — Z452 Encounter for adjustment and management of vascular access device: Secondary | ICD-10-CM | POA: Diagnosis present

## 2023-01-21 DIAGNOSIS — Z95828 Presence of other vascular implants and grafts: Secondary | ICD-10-CM

## 2023-01-21 DIAGNOSIS — C8330 Diffuse large B-cell lymphoma, unspecified site: Secondary | ICD-10-CM | POA: Diagnosis present

## 2023-01-21 MED ORDER — SODIUM CHLORIDE 0.9% FLUSH
10.0000 mL | Freq: Once | INTRAVENOUS | Status: AC
  Administered 2023-01-21: 10 mL via INTRAVENOUS
  Filled 2023-01-21: qty 10

## 2023-01-21 MED ORDER — HEPARIN SOD (PORK) LOCK FLUSH 100 UNIT/ML IV SOLN
500.0000 [IU] | Freq: Once | INTRAVENOUS | Status: AC
  Administered 2023-01-21: 500 [IU] via INTRAVENOUS
  Filled 2023-01-21: qty 5

## 2023-02-07 ENCOUNTER — Non-Acute Institutional Stay: Payer: Medicare Other | Admitting: Student

## 2023-02-07 ENCOUNTER — Encounter: Payer: Self-pay | Admitting: Student

## 2023-02-07 DIAGNOSIS — F02A0 Dementia in other diseases classified elsewhere, mild, without behavioral disturbance, psychotic disturbance, mood disturbance, and anxiety: Secondary | ICD-10-CM

## 2023-02-07 DIAGNOSIS — Z8781 Personal history of (healed) traumatic fracture: Secondary | ICD-10-CM | POA: Diagnosis not present

## 2023-02-07 DIAGNOSIS — I10 Essential (primary) hypertension: Secondary | ICD-10-CM

## 2023-02-07 NOTE — Progress Notes (Signed)
Location:  Other [facility].  Nursing Home Room Number: Virl Son 308M Place of Service:  ALF [phone]) Provider:  [name], MD  Patient Care Team: [name], MD as PCP - General (Family Medicine) Earna Coder, MD as Consulting Physician (Oncology) Sharon Seller, NP as Nurse Practitioner (Geriatric Medicine)  Extended Emergency Contact Information Primary Emergency Contact: Cardinali,Melissa Address: [address]          [place], [place] [address] [place] Phone: [phone] Relation: Daughter Secondary Emergency Contact: Quinteros,Kim Address: [address] [place], [place] [address] [place] of [place] Mobile Phone: [phone] Relation: Son  Code Status:  Full Code Goals of care: Advanced Directive information    02/07/2023    2:50 PM  Advanced Directives  Does Patient Have a Medical Advance Directive? Yes  Type of Advance Directive Out of facility DNR (pink MOST or yellow form)  Does patient want to make changes to medical advance directive? No - Patient declined     Chief Complaint  Patient presents with  . Medical Management of Chronic Issues    Medical Management of Chronic Issues.   . Quality Metric Gaps    To discuss need for Tdap, Pneumonia, Dexascan and AWV or postpone if patient refuses.     HPI:  Pt is a 89 y.o. female seen today for medical management of chronic diseases.    She is doing well.   She does have some pain in her right leg where she had a rod placed previously.   Doesn't feel out of place but more pain. She used to get some treatment that she doesn't remember, but has had some pain. She hasn't taken any pain medication for it. Doesn't hurt that bad. Just kind of sore. She is walking fine.   Past Medical History:  Diagnosis Date  . Alzheimer's dementia (HCC)   . Breast cancer (HCC)    Lymphoma--- 2004 and again in 2020 (chemo and RT twice)  . GERD (gastroesophageal reflux  disease)   . Hypertension   . Personal history of chemotherapy   . Personal history of radiation therapy   . Sleep disturbance    Past Surgical History:  Procedure Laterality Date  . BREAST BIOPSY Right ?   needle bx-benign  . BREAST BIOPSY Left 05/12/2019   [place] bx 12:00, venus marker, DIFFUSE LARGE B CELL LYMPHOMA  . BREAST EXCISIONAL BIOPSY Left 2003   lymphoma  . BREAST EXCISIONAL BIOPSY Left ?   lump was removed, benign  . HIP ARTHROPLASTY Left 01/07/2022   Procedure: ARTHROPLASTY BIPOLAR HIP (HEMIARTHROPLASTY);  Surgeon: Signa Kell, MD;  Location: [facility] ORS;  Service: Orthopedics;  Laterality: Left;  . IR IMAGING GUIDED PORT INSERTION  06/02/2019    Allergies  Allergen Reactions  . Latex Rash    Outpatient Encounter Medications as of 02/07/2023  Medication Sig  . acetaminophen (TYLENOL) 325 MG tablet Take 650 mg by mouth every 4 (four) hours as needed for moderate pain, mild pain or fever.  Marland Kitchen acetaminophen (TYLENOL) 500 MG tablet Take 1,000 mg by mouth daily.  Marland Kitchen alum & mag hydroxide-simeth (MAALOX/MYLANTA) 200-200-20 MG/5 SUSP Apply topically every 4 (four) hours as needed. 2 tablespoons  . amLODipine (NORVASC) 10 MG tablet Take 10 mg by mouth daily.  . benzonatate (TESSALON) 100 MG capsule Take 100 mg by mouth 3 (three) times daily as needed for cough.  . bismuth subsalicylate (KAOPECTATE) 262 MG/15ML  suspension Take 10 mLs by mouth as needed.  . carbamide peroxide (DEBROX) 6.5 % OTIC solution Place 5 drops into both ears as needed.  Marland Kitchen dextromethorphan-guaiFENesin (TUSSIN DM) 10-100 MG/5ML liquid Take by mouth every 4 (four) hours as needed for cough. 2 tablespoons  . donepezil (ARICEPT) 10 MG tablet Take 1 tablet by mouth at bedtime.  . fluticasone (FLONASE) 50 MCG/ACT nasal spray Place 2 sprays into both nostrils daily.  . Glucose 15 GM/32ML GEL Take 1 packet by mouth as needed.  Marland Kitchen losartan (COZAAR) 100 MG tablet Take 100 mg by mouth at bedtime.  . magnesium hydroxide  (MILK OF MAGNESIA) 400 MG/5ML suspension Take by mouth as needed for mild constipation. 2 tablespoons  . melatonin 5 MG TABS Take 5 mg by mouth at bedtime.  Marland Kitchen Nystatin POWD by Does not apply route as needed.  . ondansetron (ZOFRAN) 4 MG tablet Take 4 mg by mouth as needed for nausea or vomiting.  . OXYGEN 2lpm as needed for dyspnea/SOB  . senna (SENOKOT) 8.6 MG TABS tablet Take 1 tablet (8.6 mg total) by mouth 2 (two) times daily.   Facility-Administered Encounter Medications as of 02/07/2023  Medication  . heparin lock flush 100 unit/mL  . sodium chloride flush (NS) 0.9 % injection 10 mL    Review of Systems  Immunization History  Administered Date(s) Administered  . Covid-19, Mrna,Vaccine(Spikevax)58yrs and older 12/31/2022  . Influenza, High Dose Seasonal PF 07/10/2022  . Influenza-Unspecified 07/11/2021  . Moderna Sars-Covid-2 Vaccination 10/05/2019, 11/02/2019  . Pneumococcal Polysaccharide-23 01/26/2008, 03/14/2015  . Unspecified SARS-COV-2 Vaccination 10/05/2019, 11/02/2019, 11/14/2020, 03/20/2021, 06/15/2021, 02/19/2022, 04/24/2022, 08/02/2022  . Zoster Recombinat (Shingrix) 12/24/2017, 04/07/2018   Pertinent  Health Maintenance Due  Topic Date Due  . DEXA SCAN  Never done  . INFLUENZA VACCINE  04/24/2023      01/07/2022    8:39 PM 01/08/2022   10:00 AM 01/08/2022    7:30 PM 01/09/2022   11:32 AM 04/24/2022    1:19 PM  Fall Risk  (RETIRED) Patient Fall Risk Level High fall risk High fall risk High fall risk High fall risk High fall risk   Functional Status Survey:    Vitals:   02/07/23 1431 02/07/23 1442  BP: (!) 141/68 138/68  Pulse: 76   Resp: 18   Temp: 98 F (36.7 C)   SpO2: 97%   Weight: 122 lb (55.3 kg)   Height: 5' (1.524 m)    Body mass index is 23.83 kg/m. Physical Exam  Labs reviewed: Recent Labs    08/01/22 0000 10/23/22 1511  NA 137 134*  K 4.1 3.9  CL 103 101  CO2 28* 24  GLUCOSE  --  130*  BUN 21 25*  CREATININE 0.6 0.63  CALCIUM  9.6 8.9   Recent Labs    10/23/22 1511  AST 20  ALT 11  ALKPHOS 124  BILITOT 0.2*  PROT 7.3  ALBUMIN 4.1   Recent Labs    08/01/22 0000 10/23/22 1511  WBC 5.8 7.2  NEUTROABS 3,294.00 4.3  HGB 12.2 12.1  HCT 38 37.4  MCV  --  91.0  PLT 293 285   No results found for: "TSH" No results found for: "HGBA1C" No results found for: "CHOL", "HDL", "LDLCALC", "LDLDIRECT", "TRIG", "CHOLHDL"  Significant Diagnostic Results in last 30 days:  No results found.  Assessment/Plan There are no diagnoses linked to this encounter.   Family/ staff Communication: ***  Labs/tests ordered:  ***

## 2023-02-08 DIAGNOSIS — Z8781 Personal history of (healed) traumatic fracture: Secondary | ICD-10-CM | POA: Insufficient documentation

## 2023-02-19 ENCOUNTER — Non-Acute Institutional Stay (INDEPENDENT_AMBULATORY_CARE_PROVIDER_SITE_OTHER): Payer: Medicare Other | Admitting: Nurse Practitioner

## 2023-02-19 ENCOUNTER — Encounter: Payer: Self-pay | Admitting: Nurse Practitioner

## 2023-02-19 DIAGNOSIS — Z0000 Encounter for general adult medical examination without abnormal findings: Secondary | ICD-10-CM

## 2023-02-19 MED ORDER — ALENDRONATE SODIUM 70 MG PO TABS
70.0000 mg | ORAL_TABLET | ORAL | 11 refills | Status: DC
Start: 2023-02-19 — End: 2024-02-17

## 2023-02-19 NOTE — Progress Notes (Signed)
Subjective:   Stephanie Davidson is a 89 y.o. female who presents for Medicare Annual (Subsequent) preventive examination.  Review of Systems     Cardiac Risk Factors include: advanced age (>55men, >84 women);hypertension     Objective:    Today's Vitals   02/19/23 1046  BP: (!) 154/73  Pulse: 81  Weight: 119 lb 9.6 oz (54.3 kg)  Height: 5' (1.524 m)   Body mass index is 23.36 kg/m.     02/19/2023   10:51 AM 02/07/2023    2:50 PM 11/07/2022    2:37 PM 07/31/2022   12:17 PM 04/24/2022    1:16 PM 01/07/2022   11:13 AM 01/06/2022    2:06 PM  Advanced Directives  Does Patient Have a Medical Advance Directive? Yes Yes Yes No Yes Yes No  Type of Advance Directive Out of facility DNR (pink MOST or yellow form) Out of facility DNR (pink MOST or yellow form) Out of facility DNR (pink MOST or yellow form)  Healthcare Power of [place];Living will Healthcare Power of [place];Living will   Does patient want to make changes to medical advance directive? No - Patient declined No - Patient declined No - Patient declined   No - Guardian declined   Would patient like information on creating a medical advance directive?    No - Patient declined     Pre-existing out of facility DNR order (yellow form or pink MOST form) Pink MOST form placed in chart (order not valid for inpatient use)  Pink MOST form placed in chart (order not valid for inpatient use)        Current Medications (verified) Outpatient Encounter Medications as of 02/19/2023  Medication Sig   acetaminophen (TYLENOL) 325 MG tablet Take 650 mg by mouth every 4 (four) hours as needed for moderate pain, mild pain or fever.   acetaminophen (TYLENOL) 500 MG tablet Take 1,000 mg by mouth daily.   alendronate (FOSAMAX) 70 MG tablet Take 1 tablet (70 mg total) by mouth every 7 (seven) days. Take with a full glass of water on an empty stomach.   alum & mag hydroxide-simeth (MAALOX/MYLANTA) 200-200-20 MG/5 SUSP Give 2 Tbsp by mouth every 4 hours as  needed for gas, indigestion, or upset stomach supervised self-administration Notify MD if no relief.   amLODipine (NORVASC) 10 MG tablet Take 10 mg by mouth daily.   benzonatate (TESSALON) 100 MG capsule Take 100 mg by mouth 3 (three) times daily as needed for cough.   bismuth subsalicylate (KAOPECTATE) 262 MG/15ML suspension Take 10 mLs by mouth as needed.   carbamide peroxide (DEBROX) 6.5 % OTIC solution Place 5 drops into both ears as needed.   dextromethorphan-guaiFENesin (TUSSIN DM) 10-100 MG/5ML liquid Take by mouth every 4 (four) hours as needed for cough. 2 tablespoons   donepezil (ARICEPT) 10 MG tablet Take 1 tablet by mouth at bedtime.   fluticasone (FLONASE) 50 MCG/ACT nasal spray Place 2 sprays into both nostrils daily.   Glucose 15 GM/32ML GEL Take 1 packet by mouth as needed.   losartan (COZAAR) 100 MG tablet Take 100 mg by mouth at bedtime.   magnesium hydroxide (MILK OF MAGNESIA) 400 MG/5ML suspension Take by mouth as needed for mild constipation. 2 tablespoons   melatonin 5 MG TABS Take 5 mg by mouth at bedtime.   Nystatin POWD by Does not apply route as needed.   ondansetron (ZOFRAN) 4 MG tablet Take 4 mg by mouth as needed for nausea or vomiting.  OXYGEN 2lpm as needed for dyspnea/SOB   senna (SENOKOT) 8.6 MG TABS tablet Take 1 tablet (8.6 mg total) by mouth 2 (two) times daily.   Facility-Administered Encounter Medications as of 02/19/2023  Medication   heparin lock flush 100 unit/mL   sodium chloride flush (NS) 0.9 % injection 10 mL    Allergies (verified) Latex   History: Past Medical History:  Diagnosis Date   Alzheimer's dementia (HCC)    Breast cancer (HCC)    Lymphoma--- 2004 and again in 2020 (chemo and RT twice)   GERD (gastroesophageal reflux disease)    Hypertension    Personal history of chemotherapy    Personal history of radiation therapy    Sleep disturbance    Past Surgical History:  Procedure Laterality Date   BREAST BIOPSY Right ?    needle bx-benign   BREAST BIOPSY Left 05/12/2019   [place] bx 12:00, venus marker, DIFFUSE LARGE B CELL LYMPHOMA   BREAST EXCISIONAL BIOPSY Left 2003   lymphoma   BREAST EXCISIONAL BIOPSY Left ?   lump was removed, benign   HIP ARTHROPLASTY Left 01/07/2022   Procedure: ARTHROPLASTY BIPOLAR HIP (HEMIARTHROPLASTY);  Surgeon: Signa Kell, MD;  Location: [facility] ORS;  Service: Orthopedics;  Laterality: Left;   IR IMAGING GUIDED PORT INSERTION  06/02/2019   Family History  Problem Relation Age of Onset   Heart attack Mother    Cancer Father    Motor neuron disease Brother    Breast cancer Neg Hx    Social History   Socioeconomic History   Marital status: Widowed    Spouse name: Not on file   Number of children: 2   Years of education: Not on file   Highest education level: Not on file  Occupational History   Occupation: Bank Teller---retired  Tobacco Use   Smoking status: Never   Smokeless tobacco: Never  Vaping Use   Vaping Use: Never used  Substance and Sexual Activity   Alcohol use: No   Drug use: Never   Sexual activity: Not on file  Other Topics Concern   Not on file  Social History Narrative   Widowed 2001   1 daughter in [place]   1 son in [place] care POA      Would accept resuscitation   Prefers no feeding tube   Social Determinants of Health   Financial Resource Strain: Low Risk  (06/01/2019)   Overall Financial Resource Strain (CARDIA)    Difficulty of Paying Living Expenses: Not hard at all  Food Insecurity: No Food Insecurity (06/01/2019)   Hunger Vital Sign    Worried About Running Out of Food in the Last Year: Never true    Ran Out of Food in the Last Year: Never true  Transportation Needs: No Transportation Needs (06/01/2019)   PRAPARE - Administrator, Civil Service (Medical): No    Lack of Transportation (Non-Medical): No  Physical Activity: Sufficiently Active (06/01/2019)   Exercise Vital Sign    Days of Exercise per Week: 7 days     Minutes of Exercise per Session: 30 min  Stress: No Stress Concern Present (06/01/2019)   [employer] of Occupational Health - Occupational Stress Questionnaire    Feeling of Stress : Only a little  Social Connections: Unknown (06/01/2019)   Social Connection and Isolation Panel [NHANES]    Frequency of Communication with Friends and Family: More than three times a week    Frequency of Social Gatherings with Friends and Family: More  than three times a week    Attends Religious Services: Not on file    Active Member of Clubs or Organizations: No    Attends Banker Meetings: Never    Marital Status: Not on file    Tobacco Counseling Counseling given: Not Answered   Clinical Intake:  Pre-visit preparation completed: Yes  Pain : No/denies pain     BMI - recorded: 23 Nutritional Status: BMI of 19-24  Normal Nutritional Risks: None Diabetes: No  How often do you need to have someone help you when you read instructions, pamphlets, or other written materials from your doctor or pharmacy?: 1 - Never  Diabetic?no         Activities of Daily Living    02/19/2023   11:39 AM  In your present state of health, do you have any difficulty performing the following activities:  Hearing? 0  Vision? 0  Difficulty concentrating or making decisions? 1  Walking or climbing stairs? 1  Comment uses walker  Dressing or bathing? 0  Doing errands, shopping? 1  Comment has help  Preparing Food and eating ? N  Using the Toilet? N  In the past six months, have you accidently leaked urine? Y  Do you have problems with loss of bowel control? N  Managing your Medications? Y  Managing your Finances? Y  Comment daughter Chief Executive Officer or managing your Housekeeping? N    Patient Care Team: [name], MD as PCP - General (Family Medicine) Earna Coder, MD as Consulting Physician (Oncology) Sharon Seller, NP as Nurse Practitioner (Geriatric  Medicine)  Indicate any recent Medical Services you may have received from other than [facility] providers in the past year (date may be approximate).     Assessment:   This is a routine wellness examination for [place].  Hearing/Vision screen No results found.  Dietary issues and exercise activities discussed: Current Exercise Habits: Structured exercise class, Type of exercise: calisthenics, Time (Minutes): 30, Frequency (Times/Week): 6, Weekly Exercise (Minutes/Week): 180   Goals Addressed   None    Depression Screen     No data to display          Fall Risk     No data to display          FALL RISK PREVENTION PERTAINING TO THE HOME:  Any stairs in or around the home? No  If so, are there any without handrails? na Home free of loose throw rugs in walkways, pet beds, electrical cords, etc? Yes  Adequate lighting in your home to reduce risk of falls? Yes   ASSISTIVE DEVICES UTILIZED TO PREVENT FALLS:  Life alert? Yes  Use of a cane, walker or w/c? Yes  Grab bars in the bathroom? Yes  Shower chair or bench in shower? Yes  Elevated toilet seat or a handicapped toilet? Yes   TIMED UP AND GO:  Was the test performed? No .    Cognitive Function:        Immunizations Immunization History  Administered Date(s) Administered   Covid-19, Mrna,Vaccine(Spikevax)61yrs and older 12/31/2022   Influenza, High Dose Seasonal PF 07/10/2022   Influenza-Unspecified 07/11/2021   Moderna Sars-Covid-2 Vaccination 10/05/2019, 11/02/2019   Pneumococcal Polysaccharide-23 01/26/2008, 03/14/2015   Unspecified SARS-COV-2 Vaccination 10/05/2019, 11/02/2019, 11/14/2020, 03/20/2021, 06/15/2021, 02/19/2022, 04/24/2022, 08/02/2022   Zoster Recombinat (Shingrix) 12/24/2017, 04/07/2018    TDAP status: Due, Education has been provided regarding the importance of this vaccine. Advised may receive this vaccine at local pharmacy or  Health Dept. Aware to provide a copy of the vaccination record  if obtained from local pharmacy or Health Dept. Verbalized acceptance and understanding.  Flu Vaccine status: Up to date  Pneumococcal vaccine status: Due, Education has been provided regarding the importance of this vaccine. Advised may receive this vaccine at local pharmacy or Health Dept. Aware to provide a copy of the vaccination record if obtained from local pharmacy or Health Dept. Verbalized acceptance and understanding.  Covid-19 vaccine status: Completed vaccines  Qualifies for Shingles Vaccine? Yes   Zostavax completed No   Shingrix Completed?: Yes  Screening Tests Health Maintenance  Topic Date Due   DTaP/Tdap/Td (1 - Tdap) Never done   DEXA SCAN  Never done   Pneumonia Vaccine 22+ Years old (2 of 2 - PCV) 03/13/2016   COVID-19 Vaccine (12 - 2023-24 season) 02/25/2023   INFLUENZA VACCINE  04/24/2023   Medicare Annual Wellness (AWV)  02/19/2024   Zoster Vaccines- Shingrix  Completed   HPV VACCINES  Aged Out    Health Maintenance  Health Maintenance Due  Topic Date Due   DTaP/Tdap/Td (1 - Tdap) Never done   DEXA SCAN  Never done   Pneumonia Vaccine 66+ Years old (2 of 2 - PCV) 03/13/2016    Colorectal cancer screening: No longer required.   Mammogram status: No longer required due to age.  Bone Density status: Completed 02/14/23. Results reflect: Bone density results: OSTEOPOROSIS. Repeat every 2 years.  Lung Cancer Screening: (Low Dose CT Chest recommended if Age 44-80 years, 30 pack-year currently smoking OR have quit w/in 15years.) does not qualify.   Lung Cancer Screening Referral: na  Additional Screening:  Hepatitis C Screening: does not qualify; Completed na  Vision Screening: Recommended annual ophthalmology exams for early detection of glaucoma and other disorders of the eye. Is the patient up to date with their annual eye exam?  Yes  Who is the provider or what is the name of the office in which the patient attends annual eye exams? [facility] If  pt is not established with a provider, would they like to be referred to a provider to establish care? No .   Dental Screening: Recommended annual dental exams for proper oral hygiene  Community Resource Referral / Chronic Care Management: CRR required this visit?  No   CCM required this visit?  No      Plan:     I have personally reviewed and noted the following in the patient's chart:   Medical and social history Use of alcohol, tobacco or illicit drugs  Current medications and supplements including opioid prescriptions. Patient is not currently taking opioid prescriptions. Functional ability and status Nutritional status Physical activity Advanced directives List of other physicians Hospitalizations, surgeries, and ER visits in previous 12 months Vitals Screenings to include cognitive, depression, and falls Referrals and appointments  In addition, I have reviewed and discussed with patient certain preventive protocols, quality metrics, and best practice recommendations. A written personalized care plan for preventive services as well as general preventive health recommendations were provided to patient.     Sharon Seller, NP   02/19/2023   Place of service: [facility]

## 2023-02-27 LAB — CBC AND DIFFERENTIAL
HCT: 37 (ref 36–46)
Hemoglobin: 12.2 (ref 12.0–16.0)
Neutrophils Absolute: 3756
Platelets: 284 10*3/uL (ref 150–400)
WBC: 6

## 2023-02-27 LAB — BASIC METABOLIC PANEL
BUN: 16 (ref 4–21)
CO2: 27 — AB (ref 13–22)
Chloride: 102 (ref 99–108)
Creatinine: 0.7 (ref 0.5–1.1)
Glucose: 95
Potassium: 4.3 mEq/L (ref 3.5–5.1)
Sodium: 137 (ref 137–147)

## 2023-02-27 LAB — CBC: RBC: 4.14 (ref 3.87–5.11)

## 2023-02-27 LAB — COMPREHENSIVE METABOLIC PANEL
Calcium: 9.4 (ref 8.7–10.7)
eGFR: 84

## 2023-04-07 ENCOUNTER — Other Ambulatory Visit: Payer: Self-pay | Admitting: Nurse Practitioner

## 2023-04-17 ENCOUNTER — Other Ambulatory Visit: Payer: Self-pay | Admitting: Student

## 2023-04-17 DIAGNOSIS — Z1231 Encounter for screening mammogram for malignant neoplasm of breast: Secondary | ICD-10-CM

## 2023-04-22 ENCOUNTER — Other Ambulatory Visit: Payer: Self-pay | Admitting: *Deleted

## 2023-04-22 ENCOUNTER — Other Ambulatory Visit: Payer: Self-pay | Admitting: Nurse Practitioner

## 2023-04-22 DIAGNOSIS — C8599 Non-Hodgkin lymphoma, unspecified, extranodal and solid organ sites: Secondary | ICD-10-CM

## 2023-04-23 ENCOUNTER — Encounter: Payer: Self-pay | Admitting: Internal Medicine

## 2023-04-23 ENCOUNTER — Inpatient Hospital Stay (HOSPITAL_BASED_OUTPATIENT_CLINIC_OR_DEPARTMENT_OTHER): Payer: Medicare Other | Admitting: Internal Medicine

## 2023-04-23 ENCOUNTER — Ambulatory Visit: Payer: Medicare Other | Admitting: Internal Medicine

## 2023-04-23 ENCOUNTER — Other Ambulatory Visit: Payer: Medicare Other

## 2023-04-23 ENCOUNTER — Inpatient Hospital Stay: Payer: Medicare Other | Attending: Internal Medicine

## 2023-04-23 VITALS — BP 138/60 | HR 75 | Temp 97.8°F | Resp 16 | Ht 60.0 in | Wt 117.8 lb

## 2023-04-23 DIAGNOSIS — Z8572 Personal history of non-Hodgkin lymphomas: Secondary | ICD-10-CM | POA: Diagnosis not present

## 2023-04-23 DIAGNOSIS — C8599 Non-Hodgkin lymphoma, unspecified, extranodal and solid organ sites: Secondary | ICD-10-CM

## 2023-04-23 DIAGNOSIS — Z9221 Personal history of antineoplastic chemotherapy: Secondary | ICD-10-CM | POA: Diagnosis not present

## 2023-04-23 DIAGNOSIS — Z79899 Other long term (current) drug therapy: Secondary | ICD-10-CM | POA: Insufficient documentation

## 2023-04-23 DIAGNOSIS — F02A0 Dementia in other diseases classified elsewhere, mild, without behavioral disturbance, psychotic disturbance, mood disturbance, and anxiety: Secondary | ICD-10-CM | POA: Insufficient documentation

## 2023-04-23 LAB — CBC WITH DIFFERENTIAL (CANCER CENTER ONLY)
Abs Immature Granulocytes: 0.02 10*3/uL (ref 0.00–0.07)
Basophils Absolute: 0 10*3/uL (ref 0.0–0.1)
Basophils Relative: 0 %
Eosinophils Absolute: 0.1 10*3/uL (ref 0.0–0.5)
Eosinophils Relative: 2 %
HCT: 38.5 % (ref 36.0–46.0)
Hemoglobin: 12.4 g/dL (ref 12.0–15.0)
Immature Granulocytes: 0 %
Lymphocytes Relative: 28 %
Lymphs Abs: 1.8 10*3/uL (ref 0.7–4.0)
MCH: 29.2 pg (ref 26.0–34.0)
MCHC: 32.2 g/dL (ref 30.0–36.0)
MCV: 90.8 fL (ref 80.0–100.0)
Monocytes Absolute: 0.5 10*3/uL (ref 0.1–1.0)
Monocytes Relative: 8 %
Neutro Abs: 3.9 10*3/uL (ref 1.7–7.7)
Neutrophils Relative %: 62 %
Platelet Count: 283 10*3/uL (ref 150–400)
RBC: 4.24 MIL/uL (ref 3.87–5.11)
RDW: 14.2 % (ref 11.5–15.5)
WBC Count: 6.4 10*3/uL (ref 4.0–10.5)
nRBC: 0 % (ref 0.0–0.2)

## 2023-04-23 LAB — CMP (CANCER CENTER ONLY)
ALT: 12 U/L (ref 0–44)
AST: 20 U/L (ref 15–41)
Albumin: 4.1 g/dL (ref 3.5–5.0)
Alkaline Phosphatase: 112 U/L (ref 38–126)
Anion gap: 7 (ref 5–15)
BUN: 26 mg/dL — ABNORMAL HIGH (ref 8–23)
CO2: 24 mmol/L (ref 22–32)
Calcium: 9.3 mg/dL (ref 8.9–10.3)
Chloride: 103 mmol/L (ref 98–111)
Creatinine: 0.7 mg/dL (ref 0.44–1.00)
GFR, Estimated: >60 mL/min (ref >60)
Glucose, Bld: 100 mg/dL — ABNORMAL HIGH (ref 70–99)
Potassium: 3.9 mmol/L (ref 3.5–5.1)
Sodium: 134 mmol/L — ABNORMAL LOW (ref 135–145)
Total Bilirubin: 0.3 mg/dL (ref 0.3–1.2)
Total Protein: 7.5 g/dL (ref 6.5–8.1)

## 2023-04-23 LAB — LACTATE DEHYDROGENASE: LDH: 166 U/L (ref 98–192)

## 2023-04-23 MED ORDER — HEPARIN SOD (PORK) LOCK FLUSH 100 UNIT/ML IV SOLN
500.0000 [IU] | Freq: Once | INTRAVENOUS | Status: AC
  Administered 2023-04-23: 500 [IU] via INTRAVENOUS
  Filled 2023-04-23: qty 5

## 2023-04-23 MED ORDER — SODIUM CHLORIDE 0.9% FLUSH
10.0000 mL | INTRAVENOUS | Status: DC | PRN
  Administered 2023-04-23: 10 mL via INTRAVENOUS
  Filled 2023-04-23: qty 10

## 2023-04-23 NOTE — Progress Notes (Signed)
[facility] CONSULT NOTE  Patient Care Team: [name], MD as PCP - General (Family Medicine) Earna Coder, MD as Consulting Physician (Oncology) Sharon Seller, NP as Nurse Practitioner (Geriatric Medicine)  CHIEF COMPLAINTS/PURPOSE OF CONSULTATION: Breast lymphoma   Oncology History Overview Note  # AUG 2020- LEFT BREAST DLBCL [non-GCB subtype; Double expressor;NEG- CD-30; Ki-67-80-90%]; FISH -negative for translocations; SEP 2020- PET-left breast intense uptake; no distant disease noted.  Hold off bone marrow biopsy [patient/family preference not to be aggressive]; Stage adjusted IPI-"2 points" cure rate 50-60%.   # 9/21- Pred+Rituxan; OCT 2020- mini-R-CHOP; s/p radiation [feb 19th, 2021] -------------------------------------------------------------------------------------------- # 2006- DLBCL of Breast [s? 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  ELENIE KONTOS 89 y.o.  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  No complaints of any breast discomfort. No swollen lymph nodes. Appetite is good. Energy is good. Denies any new lumps or bumps.   No nausea vomiting.  No fevers no chills.  Review of Systems  Constitutional:  Negative for chills, diaphoresis, fever and malaise/fatigue.  HENT:  Negative for nosebleeds and sore throat.   Eyes:  Negative for double vision.  Respiratory:   Negative for cough, hemoptysis, sputum production, shortness of breath and wheezing.   Cardiovascular:  Negative for chest pain, palpitations, orthopnea and leg swelling.  Gastrointestinal:  Negative for abdominal pain, blood in stool, constipation, diarrhea, heartburn, melena, nausea and vomiting.  Genitourinary:  Negative for dysuria, frequency and urgency.  Musculoskeletal:  Positive for back pain and joint pain.  Skin: Negative.  Negative for itching and rash.  Neurological:  Negative for dizziness, tingling, focal weakness, weakness and headaches.  Endo/Heme/Allergies:  Does not bruise/bleed easily.  Psychiatric/Behavioral:  Positive for memory loss. Negative for depression. The patient is not nervous/anxious and does not have insomnia.      MEDICAL HISTORY:  Past Medical History:  Diagnosis Date   Alzheimer's dementia (HCC)    Breast cancer (HCC)    Lymphoma--- 2004 and again in 2020 (chemo and RT twice)   GERD (gastroesophageal reflux disease)    Hypertension    Personal history of chemotherapy    Personal history of radiation therapy    Sleep disturbance     SURGICAL HISTORY: Past Surgical History:  Procedure Laterality Date   BREAST BIOPSY Right ?   needle bx-benign   BREAST BIOPSY Left 05/12/2019   [place] bx 12:00, venus marker, DIFFUSE LARGE B CELL LYMPHOMA   BREAST EXCISIONAL BIOPSY Left 2003   lymphoma   BREAST EXCISIONAL BIOPSY Left ?   lump was removed, benign   HIP ARTHROPLASTY Left 01/07/2022   Procedure: ARTHROPLASTY BIPOLAR HIP (HEMIARTHROPLASTY);  Surgeon: Signa Kell, MD;  Location: [facility] ORS;  Service: Orthopedics;  Laterality: Left;   IR IMAGING GUIDED PORT INSERTION  06/02/2019    SOCIAL HISTORY: Social History   Socioeconomic History   Marital status: Widowed  Spouse name: Not on file   Number of children: 2   Years of education: Not on file   Highest education level: Not on file  Occupational History   Occupation: Bank Teller---retired  Tobacco  Use   Smoking status: Never   Smokeless tobacco: Never  Vaping Use   Vaping status: Never Used  Substance and Sexual Activity   Alcohol use: No   Drug use: Never   Sexual activity: Not on file  Other Topics Concern   Not on file  Social History Narrative   Widowed 2001   1 daughter in [place]   1 son in [place] care POA      Would accept resuscitation   Prefers no feeding tube   Social Determinants of Health   Financial Resource Strain: Low Risk  (06/01/2019)   Overall Financial Resource Strain (CARDIA)    Difficulty of Paying Living Expenses: Not hard at all  Food Insecurity: No Food Insecurity (06/01/2019)   Hunger Vital Sign    Worried About Running Out of Food in the Last Year: Never true    Ran Out of Food in the Last Year: Never true  Transportation Needs: No Transportation Needs (06/01/2019)   PRAPARE - Administrator, Civil Service (Medical): No    Lack of Transportation (Non-Medical): No  Physical Activity: Sufficiently Active (06/01/2019)   Exercise Vital Sign    Days of Exercise per Week: 7 days    Minutes of Exercise per Session: 30 min  Stress: No Stress Concern Present (06/01/2019)   [employer] of Occupational Health - Occupational Stress Questionnaire    Feeling of Stress : Only a little  Social Connections: Unknown (06/01/2019)   Social Connection and Isolation Panel [NHANES]    Frequency of Communication with Friends and Family: More than three times a week    Frequency of Social Gatherings with Friends and Family: More than three times a week    Attends Religious Services: Not on Marketing executive or Organizations: No    Attends Banker Meetings: Never    Marital Status: Not on file  Intimate Partner Violence: Not At Risk (06/01/2019)   Humiliation, Afraid, Rape, and Kick questionnaire    Fear of Current or Ex-Partner: No    Emotionally Abused: No    Physically Abused: No    Sexually Abused: No     FAMILY HISTORY: Family History  Problem Relation Age of Onset   Heart attack Mother    Cancer Father    Motor neuron disease Brother    Breast cancer Neg Hx     ALLERGIES:  is allergic to latex.  MEDICATIONS:  Current Outpatient Medications  Medication Sig Dispense Refill   acetaminophen (TYLENOL) 325 MG tablet Take 650 mg by mouth every 4 (four) hours as needed for moderate pain, mild pain or fever.     acetaminophen (TYLENOL) 500 MG tablet Take 1,000 mg by mouth daily.     alendronate (FOSAMAX) 70 MG tablet Take 1 tablet (70 mg total) by mouth every 7 (seven) days. Take with a full glass of water on an empty stomach. 4 tablet 11   alum & mag hydroxide-simeth (MAALOX/MYLANTA) 200-200-20 MG/5 SUSP Give 2 Tbsp by mouth every 4 hours as needed for gas, indigestion, or upset stomach supervised self-administration Notify MD if no relief.     amLODipine (NORVASC) 10 MG tablet TAKE 1 TABLET BY MOUTH DAILY 90 tablet 1   bismuth subsalicylate (  KAOPECTATE) 262 MG/15ML suspension Take 10 mLs by mouth as needed.     carbamide peroxide (DEBROX) 6.5 % OTIC solution Place 5 drops into both ears as needed.     donepezil (ARICEPT) 10 MG tablet TAKE ONE TABLET BY MOUTH AT BEDTIME 90 tablet 3   fluticasone (FLONASE) 50 MCG/ACT nasal spray Place 2 sprays into both nostrils daily.     losartan (COZAAR) 100 MG tablet Take 100 mg by mouth at bedtime.     magnesium hydroxide (MILK OF MAGNESIA) 400 MG/5ML suspension Take by mouth as needed for mild constipation. 2 tablespoons     melatonin 5 MG TABS Take 5 mg by mouth at bedtime.     Nystatin POWD by Does not apply route as needed.     senna (SENOKOT) 8.6 MG TABS tablet Take 1 tablet (8.6 mg total) by mouth 2 (two) times daily. 120 tablet 0   Glucose 15 GM/32ML GEL Take 1 packet by mouth as needed. (Patient not taking: Reported on 04/23/2023)     ondansetron (ZOFRAN) 4 MG tablet Take 4 mg by mouth as needed for nausea or vomiting. (Patient not taking:  Reported on 04/23/2023)     No current facility-administered medications for this visit.   Facility-Administered Medications Ordered in Other Visits  Medication Dose Route Frequency Provider Last Rate Last Admin   heparin lock flush 100 unit/mL  500 Units Intravenous Once Durenda Hurt E, NP       sodium chloride flush (NS) 0.9 % injection 10 mL  10 mL Intravenous PRN Mauro Kaufmann, NP       sodium chloride flush (NS) 0.9 % injection 10 mL  10 mL Intravenous PRN Earna Coder, MD   10 mL at 04/23/23 0846      .  PHYSICAL EXAMINATION: ECOG PERFORMANCE STATUS: 1 - Symptomatic but completely ambulatory  Vitals:   04/23/23 0902  BP: 138/60  Pulse: 75  Resp: 16  Temp: 97.8 F (36.6 C)  SpO2: 100%     Filed Weights   04/23/23 0902  Weight: 117 lb 12.8 oz (53.4 kg)    Left BREAST exam [in the presence of nurse]- no unusual skin changes or dominant masses felt. Surgical scars noted.    Physical Exam HENT:     Head: Normocephalic and atraumatic.     Mouth/Throat:     Pharynx: No oropharyngeal exudate.  Eyes:     Pupils: Pupils are equal, round, and reactive to light.  Cardiovascular:     Rate and Rhythm: Normal rate and regular rhythm.  Pulmonary:     Effort: Pulmonary effort is normal. No respiratory distress.     Breath sounds: Normal breath sounds. No wheezing.  Abdominal:     General: Bowel sounds are normal. There is no distension.     Palpations: Abdomen is soft. There is no mass.     Tenderness: There is no abdominal tenderness. There is no guarding or rebound.  Musculoskeletal:        General: No tenderness. Normal range of motion.     Cervical back: Normal range of motion and neck supple.  Skin:    General: Skin is warm.  Neurological:     Mental Status: She is alert and oriented to person, place, and time.  Psychiatric:        Mood and Affect: Affect normal.      LABORATORY DATA:  I have reviewed the data as listed Lab Results   Component Value Date  WBC 6.4 04/23/2023   HGB 12.4 04/23/2023   HCT 38.5 04/23/2023   MCV 90.8 04/23/2023   PLT 283 04/23/2023   Recent Labs    08/01/22 0000 10/23/22 1511 04/23/23 0844  NA 137 134* 134*  K 4.1 3.9 3.9  CL 103 101 103  CO2 28* 24 24  GLUCOSE  --  130* 100*  BUN 21 25* 26*  CREATININE 0.6 0.63 0.70  CALCIUM 9.6 8.9 9.3  GFRNONAA  --  >60 >60  PROT  --  7.3 7.5  ALBUMIN  --  4.1 4.1  AST  --  20 20  ALT  --  11 12  ALKPHOS  --  124 112  BILITOT  --  0.2* 0.3    RADIOGRAPHIC STUDIES: I have personally reviewed the radiological images as listed and agreed with the findings in the report. No results found.  ASSESSMENT & PLAN:   Lymphoma of breast (HCC) # Diffuse large B-cell lymphoma left breast Non-GCB subtype; double expresser. Stage I E. S/p 4- R-mini CHOP; s/p consolidation radiation [finished Feb 19th 2021]. AUG 2021- PET scan-complete response noted in the left breast; slight uptake noted in the mediastinal lymph nodes/clinically reactive.   AUG 023-ultrasound/mammogram negative for any recurrent disease.  Clinically stable. awaiting mammogram August 2024.  # Dementia:continues to be fairly independent-  Stable   # port/IV access-stable.  Port flush today.  Discussed explantation; discussed pro and cons- pt's preference to keep it.  # DISPOSITION: # port flush in 3 months # follow up in 6 months- MD; port flush- labs- cbc/cmp/LDH-Dr.B  Cc; Dr.Baboff  All questions were answered. The patient knows to call the clinic with any problems, questions or concerns.    Earna Coder, MD 04/23/2023 9:32 AM

## 2023-04-23 NOTE — Assessment & Plan Note (Addendum)
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 Stage I E. S/p 4- R-mini CHOP; s/p consolidation radiation [finished Feb 19th 2021]. AUG 2021- PET scan-complete response noted in the left breast; slight uptake noted in the mediastinal lymph nodes/clinically reactive.   AUG 023-ultrasound/mammogram negative for any recurrent disease.  Clinically stable. awaiting mammogram August 2024.  # Dementia:continues to be fairly independent-  Stable   # port/IV access-stable.  Port flush today.  Discussed explantation; discussed pro and cons- pt's preference to keep it.  # DISPOSITION: # port flush in 3 months # follow up in 6 months- MD; port flush- labs- cbc/cmp/LDH-Dr.B  Cc; Dr.Baboff

## 2023-04-23 NOTE — Progress Notes (Signed)
Pt lives in Assisted living at [facility]. She is very happy there. No complaints of any breast discomfort. No swollen lymph nodes. Appetite is good. Energy is good.

## 2023-05-06 ENCOUNTER — Encounter: Payer: Self-pay | Admitting: Nurse Practitioner

## 2023-05-06 ENCOUNTER — Non-Acute Institutional Stay: Payer: Self-pay | Admitting: Nurse Practitioner

## 2023-05-06 DIAGNOSIS — F02A0 Dementia in other diseases classified elsewhere, mild, without behavioral disturbance, psychotic disturbance, mood disturbance, and anxiety: Secondary | ICD-10-CM

## 2023-05-06 DIAGNOSIS — G479 Sleep disorder, unspecified: Secondary | ICD-10-CM

## 2023-05-06 DIAGNOSIS — C8599 Non-Hodgkin lymphoma, unspecified, extranodal and solid organ sites: Secondary | ICD-10-CM | POA: Diagnosis not present

## 2023-05-06 DIAGNOSIS — M1991 Primary osteoarthritis, unspecified site: Secondary | ICD-10-CM

## 2023-05-06 DIAGNOSIS — I10 Essential (primary) hypertension: Secondary | ICD-10-CM

## 2023-05-06 DIAGNOSIS — K5900 Constipation, unspecified: Secondary | ICD-10-CM | POA: Diagnosis not present

## 2023-05-06 NOTE — Progress Notes (Signed)
Location:  Other [facility].  Nursing Home Room Number: [address] Place of Service:  ALF [phone]) Abbey Chatters, NP  PCP: [name], MD  Patient Care Team: [name], MD as PCP - General (Family Medicine) Earna Coder, MD as Consulting Physician (Oncology) Sharon Seller, NP as Nurse Practitioner (Geriatric Medicine)  Extended Emergency Contact Information Primary Emergency Contact: Cardinali,Melissa Address: [address]          [place], [place] [address] [place] of [place] Phone: [phone] Relation: Daughter Secondary Emergency Contact: Nabozny,Kim Address: [address] [place], [place] [address] [place] of [place] Phone: [phone] Relation: Son  Goals of care: Advanced Directive information    05/06/2023    2:45 PM  Advanced Directives  Does Patient Have a Medical Advance Directive? Yes  Type of Advance Directive Out of facility DNR (pink MOST or yellow form)  Does patient want to make changes to medical advance directive? No - Patient declined     Chief Complaint  Patient presents with   Medical Management of Chronic Issues    Medical Management of Chronic Issues.     HPI:  Pt is a 89 y.o. female seen today for medical management of chronic disease.  Pt is in AL at [facility]. She has doing well. Got her hair done today at 1 pm.  She is followed by oncology due to lymphoma of the breast had infusion 2 weeks ago and port flushed. Tolerated infusion well.  Mammogram scheduled for this month.  Reports she tries to stay active and independent.  Generally eats down stairs but due to recent COVID cases in facility has had to eat in her room. She is wearing mask around facility.   On fosamax due to Osteoporosis- tolerating well   Htn- on norvasc 10 mg daily with losartan 100 mg daily No LE edema. Wears compression hose.   Does not sleep a lot- gets about 6 hours a night.    Past Medical  History:  Diagnosis Date   Alzheimer's dementia (HCC)    Breast cancer (HCC)    Lymphoma--- 2004 and again in 2020 (chemo and RT twice)   GERD (gastroesophageal reflux disease)    Hypertension    Personal history of chemotherapy    Personal history of radiation therapy    Sleep disturbance    Past Surgical History:  Procedure Laterality Date   BREAST BIOPSY Right ?   needle bx-benign   BREAST BIOPSY Left 05/12/2019   [place] bx 12:00, venus marker, DIFFUSE LARGE B CELL LYMPHOMA   BREAST EXCISIONAL BIOPSY Left 2003   lymphoma   BREAST EXCISIONAL BIOPSY Left ?   lump was removed, benign   HIP ARTHROPLASTY Left 01/07/2022   Procedure: ARTHROPLASTY BIPOLAR HIP (HEMIARTHROPLASTY);  Surgeon: Signa Kell, MD;  Location: [facility] ORS;  Service: Orthopedics;  Laterality: Left;   IR IMAGING GUIDED PORT INSERTION  06/02/2019    Allergies  Allergen Reactions   Latex Rash    Outpatient Encounter Medications as of 05/06/2023  Medication Sig   acetaminophen (TYLENOL) 325 MG tablet Take 650 mg by mouth every 4 (four) hours as needed for moderate pain, mild pain or fever.   acetaminophen (TYLENOL) 500 MG tablet Take 1,000 mg by mouth daily.   alendronate (FOSAMAX) 70 MG tablet Take 1 tablet (70 mg total) by mouth every 7 (seven) days. Take with a full glass of water on an empty  stomach.   alum & mag hydroxide-simeth (MAALOX/MYLANTA) 200-200-20 MG/5 SUSP Give 2 Tbsp by mouth every 4 hours as needed for gas, indigestion, or upset stomach supervised self-administration Notify MD if no relief.   amLODipine (NORVASC) 10 MG tablet TAKE 1 TABLET BY MOUTH DAILY   benzonatate (TESSALON) 100 MG capsule Take 100 mg by mouth 3 (three) times daily.   bismuth subsalicylate (KAOPECTATE) 262 MG/15ML suspension Take 10 mLs by mouth as needed.   carbamide peroxide (DEBROX) 6.5 % OTIC solution Place 5 drops into both ears as needed.   dextromethorphan-guaiFENesin (ROBITUSSIN-DM) 10-100 MG/5ML liquid Take 5 mLs by mouth  every 4 (four) hours as needed for cough.   donepezil (ARICEPT) 10 MG tablet TAKE ONE TABLET BY MOUTH AT BEDTIME   fluticasone (FLONASE) 50 MCG/ACT nasal spray Place 2 sprays into both nostrils daily.   Glucose 15 GM/32ML GEL Take 1 packet by mouth as needed.   losartan (COZAAR) 100 MG tablet Take 100 mg by mouth at bedtime.   magnesium hydroxide (MILK OF MAGNESIA) 400 MG/5ML suspension Take by mouth as needed for mild constipation. 2 tablespoons   melatonin 5 MG TABS Take 5 mg by mouth at bedtime.   Nystatin POWD Apply to excoriated area topically as needed twice daily.   ondansetron (ZOFRAN) 4 MG tablet Take 4 mg by mouth as needed for nausea or vomiting. Up to 3 times daily.   OXYGEN 2LPM for dyspnea or SOB   senna (SENOKOT) 8.6 MG TABS tablet Take 1 tablet (8.6 mg total) by mouth 2 (two) times daily.   Facility-Administered Encounter Medications as of 05/06/2023  Medication   heparin lock flush 100 unit/mL   sodium chloride flush (NS) 0.9 % injection 10 mL    Review of Systems  Constitutional:  Negative for activity change, appetite change, fatigue and unexpected weight change.  HENT:  Negative for congestion and hearing loss.   Eyes: Negative.   Respiratory:  Negative for cough and shortness of breath.   Cardiovascular:  Negative for chest pain, palpitations and leg swelling.  Gastrointestinal:  Negative for abdominal pain, constipation and diarrhea.  Genitourinary:  Negative for difficulty urinating and dysuria.  Musculoskeletal:  Negative for arthralgias and myalgias.  Skin:  Negative for color change and wound.  Neurological:  Negative for dizziness and weakness.  Psychiatric/Behavioral:  Negative for agitation, behavioral problems and confusion.      Immunization History  Administered Date(s) Administered   Covid-19, Mrna,Vaccine(Spikevax)44yrs and older 12/31/2022   Influenza, High Dose Seasonal PF 07/10/2022   Influenza-Unspecified 07/11/2021   Moderna Sars-Covid-2  Vaccination 10/05/2019, 11/02/2019   PNEUMOCOCCAL CONJUGATE-20 03/04/2023   Pneumococcal Polysaccharide-23 01/26/2008, 03/14/2015   Tdap 02/12/2023   Unspecified SARS-COV-2 Vaccination 10/05/2019, 11/02/2019, 11/14/2020, 03/20/2021, 06/15/2021, 02/19/2022, 04/24/2022, 08/02/2022   Zoster Recombinant(Shingrix) 12/24/2017, 04/07/2018   Pertinent  Health Maintenance Due  Topic Date Due   DEXA SCAN  Never done   INFLUENZA VACCINE  04/24/2023      01/07/2022    8:39 PM 01/08/2022   10:00 AM 01/08/2022    7:30 PM 01/09/2022   11:32 AM 04/24/2022    1:19 PM  Fall Risk  (RETIRED) Patient Fall Risk Level High fall risk High fall risk High fall risk High fall risk High fall risk   Functional Status Survey:    Vitals:   05/06/23 1429  BP: 134/69  Pulse: 71  Resp: (!) 22  Temp: 97.7 F (36.5 C)  SpO2: 97%  Weight: 119 lb 3.2 oz (54.1 kg)  Height: 5' (1.524 m)   Body mass index is 23.28 kg/m. Physical Exam Constitutional:      General: She is not in acute distress.    Appearance: She is well-developed. She is not diaphoretic.  HENT:     Head: Normocephalic and atraumatic.     Mouth/Throat:     Pharynx: No oropharyngeal exudate.  Eyes:     Conjunctiva/sclera: Conjunctivae normal.     Pupils: Pupils are equal, round, and reactive to light.  Cardiovascular:     Rate and Rhythm: Normal rate and regular rhythm.     Heart sounds: Normal heart sounds.  Pulmonary:     Effort: Pulmonary effort is normal.     Breath sounds: Normal breath sounds.  Abdominal:     General: Bowel sounds are normal.     Palpations: Abdomen is soft.  Musculoskeletal:     Cervical back: Normal range of motion and neck supple.     Right lower leg: No edema.     Left lower leg: No edema.  Skin:    General: Skin is warm and dry.  Neurological:     Mental Status: She is alert. Mental status is at baseline.     Motor: No weakness.     Gait: Gait normal.  Psychiatric:        Mood and Affect: Mood normal.      Labs reviewed: Recent Labs    10/23/22 1511 02/27/23 0000 04/23/23 0844  NA 134* 137 134*  K 3.9 4.3 3.9  CL 101 102 103  CO2 24 27* 24  GLUCOSE 130*  --  100*  BUN 25* 16 26*  CREATININE 0.63 0.7 0.70  CALCIUM 8.9 9.4 9.3   Recent Labs    10/23/22 1511 04/23/23 0844  AST 20 20  ALT 11 12  ALKPHOS 124 112  BILITOT 0.2* 0.3  PROT 7.3 7.5  ALBUMIN 4.1 4.1   Recent Labs    10/23/22 1511 02/27/23 0000 04/23/23 0844  WBC 7.2 6.0 6.4  NEUTROABS 4.3 3,756.00 3.9  HGB 12.1 12.2 12.4  HCT 37.4 37 38.5  MCV 91.0  --  90.8  PLT 285 284 283   No results found for: "TSH" No results found for: "HGBA1C" No results found for: "CHOL", "HDL", "LDLCALC", "LDLDIRECT", "TRIG", "CHOLHDL"  Significant Diagnostic Results in last 30 days:  No results found.  Assessment/Plan 1. Primary hypertension -Blood pressure overall  controlled, goal bp <140/90 Continue current medications and dietary modifications follow metabolic panel  2. Constipation, unspecified constipation type -stable on current regimen   3. Primary osteoarthritis, unspecified site Pain controlled.   4. Lymphoma of breast (HCC) Continues to follow up oncology.   5. 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   6. Sleep disturbance -stable, sleeps abt 6 hours at night.    Stephanie Davidson. [facility] [phone]

## 2023-05-19 ENCOUNTER — Ambulatory Visit
Admission: RE | Admit: 2023-05-19 | Discharge: 2023-05-19 | Disposition: A | Discharge status: Home / Self Care | Payer: Medicare Other | Source: Ambulatory Visit | Attending: Student | Admitting: Student

## 2023-05-19 DIAGNOSIS — Z1231 Encounter for screening mammogram for malignant neoplasm of breast: Secondary | ICD-10-CM | POA: Diagnosis present

## 2023-05-20 ENCOUNTER — Other Ambulatory Visit: Payer: Self-pay | Admitting: Nurse Practitioner

## 2023-05-20 NOTE — Telephone Encounter (Signed)
Patient has request refill on pended medication.

## 2023-06-02 ENCOUNTER — Other Ambulatory Visit: Payer: Self-pay | Admitting: Student

## 2023-07-24 ENCOUNTER — Inpatient Hospital Stay: Payer: Medicare Other | Attending: Internal Medicine

## 2023-07-24 DIAGNOSIS — C8599 Non-Hodgkin lymphoma, unspecified, extranodal and solid organ sites: Secondary | ICD-10-CM | POA: Insufficient documentation

## 2023-07-24 DIAGNOSIS — Z95828 Presence of other vascular implants and grafts: Secondary | ICD-10-CM

## 2023-07-24 DIAGNOSIS — Z452 Encounter for adjustment and management of vascular access device: Secondary | ICD-10-CM | POA: Diagnosis present

## 2023-07-24 MED ORDER — HEPARIN SOD (PORK) LOCK FLUSH 100 UNIT/ML IV SOLN
500.0000 [IU] | Freq: Once | INTRAVENOUS | Status: AC
  Administered 2023-07-24: 500 [IU] via INTRAVENOUS
  Filled 2023-07-24: qty 5

## 2023-07-24 MED ORDER — SODIUM CHLORIDE 0.9% FLUSH
10.0000 mL | Freq: Once | INTRAVENOUS | Status: AC
  Administered 2023-07-24: 10 mL via INTRAVENOUS
  Filled 2023-07-24: qty 10

## 2023-08-25 ENCOUNTER — Non-Acute Institutional Stay: Payer: Self-pay | Admitting: Student

## 2023-08-25 DIAGNOSIS — M810 Age-related osteoporosis without current pathological fracture: Secondary | ICD-10-CM

## 2023-08-25 DIAGNOSIS — E782 Mixed hyperlipidemia: Secondary | ICD-10-CM | POA: Diagnosis not present

## 2023-08-25 DIAGNOSIS — K5900 Constipation, unspecified: Secondary | ICD-10-CM

## 2023-08-25 DIAGNOSIS — F02A0 Dementia in other diseases classified elsewhere, mild, without behavioral disturbance, psychotic disturbance, mood disturbance, and anxiety: Secondary | ICD-10-CM

## 2023-08-25 DIAGNOSIS — I10 Essential (primary) hypertension: Secondary | ICD-10-CM

## 2023-08-25 NOTE — Progress Notes (Unsigned)
Location:  Other   Place of Service:  ALF (13) Provider:  Judeth Horn, MD  Patient Care Team: [name], MD as PCP - General (Family Medicine) Earna Coder, MD as Consulting Physician (Oncology) Sharon Seller, NP as Nurse Practitioner (Geriatric Medicine)  Extended Emergency Contact Information Primary Emergency Contact: Cardinali,Melissa Address: [address]          [place], [place] [address] [place] of [place] Phone: [phone] Relation: Daughter Secondary Emergency Contact: Morga,Kim Address: [address] [place], [place] [address] [place] of [place] Mobile Phone: [phone] Relation: Son  Code Status:  Full Code Goals of care: Advanced Directive information    05/06/2023    2:45 PM  Advanced Directives  Does Patient Have a Medical Advance Directive? Yes  Type of Advance Directive Out of facility DNR (pink MOST or yellow form)  Does patient want to make changes to medical advance directive? No - Patient declined     Chief Complaint  Patient presents with   Medical Management of Chronic Conditions    HPI:  Pt is a 89 y.o. female seen today for medical management of chronic diseases.    She is doing well without concerns at this time. She takes her medications every day. It's really easy for her to take her medications.   She gets her port flushd every so often.   She enjoys exercise classes and memory exercises. She walks twice a day.   She is so grateful she can do so many things. She never takes a daytime nap. She stays up till 9PM and up at 4Am. She has good energy. She has never needed a lot of sleep.    Past Medical History:  Diagnosis Date   Alzheimer's dementia (HCC)    Breast cancer (HCC)    Lymphoma--- 2004 and again in 2020 (chemo and RT twice)   GERD (gastroesophageal reflux disease)    Hypertension    Personal history of chemotherapy    Personal history of radiation therapy     Sleep disturbance    Past Surgical History:  Procedure Laterality Date   BREAST BIOPSY Right ?   needle bx-benign   BREAST BIOPSY Left 05/12/2019   [place] bx 12:00, venus marker, DIFFUSE LARGE B CELL LYMPHOMA   BREAST EXCISIONAL BIOPSY Left 2003   lymphoma   BREAST EXCISIONAL BIOPSY Left ?   lump was removed, benign   HIP ARTHROPLASTY Left 01/07/2022   Procedure: ARTHROPLASTY BIPOLAR HIP (HEMIARTHROPLASTY);  Surgeon: Signa Kell, MD;  Location: [facility] ORS;  Service: Orthopedics;  Laterality: Left;   IR IMAGING GUIDED PORT INSERTION  06/02/2019    Allergies  Allergen Reactions   Latex Rash    Outpatient Encounter Medications as of 08/25/2023  Medication Sig   acetaminophen (TYLENOL) 325 MG tablet Take 650 mg by mouth every 4 (four) hours as needed for moderate pain, mild pain or fever.   acetaminophen (TYLENOL) 500 MG tablet Take 1,000 mg by mouth daily.   alendronate (FOSAMAX) 70 MG tablet Take 1 tablet (70 mg total) by mouth every 7 (seven) days. Take with a full glass of water on an empty stomach.   alum & mag hydroxide-simeth (MAALOX/MYLANTA) 200-200-20 MG/5 SUSP Give 2 Tbsp by mouth every 4 hours as needed for gas, indigestion, or upset stomach supervised self-administration Notify MD if no relief.   amLODipine (NORVASC) 10 MG tablet TAKE 1 TABLET BY MOUTH DAILY  benzonatate (TESSALON) 100 MG capsule Take 100 mg by mouth 3 (three) times daily.   bismuth subsalicylate (KAOPECTATE) 262 MG/15ML suspension Take 10 mLs by mouth as needed.   carbamide peroxide (DEBROX) 6.5 % OTIC solution Place 5 drops into both ears as needed.   dextromethorphan-guaiFENesin (ROBITUSSIN-DM) 10-100 MG/5ML liquid Take 5 mLs by mouth every 4 (four) hours as needed for cough.   donepezil (ARICEPT) 10 MG tablet TAKE ONE TABLET BY MOUTH AT BEDTIME   fluticasone (FLONASE) 50 MCG/ACT nasal spray Place 2 sprays into both nostrils daily.   Glucose 15 GM/32ML GEL Take 1 packet by mouth as needed.   losartan  (COZAAR) 100 MG tablet TAKE ONE TABLET BY MOUTH AT BEDTIME   magnesium hydroxide (MILK OF MAGNESIA) 400 MG/5ML suspension Take by mouth as needed for mild constipation. 2 tablespoons   melatonin 5 MG TABS Take 5 mg by mouth at bedtime.   Nystatin POWD Apply to excoriated area topically as needed twice daily.   ondansetron (ZOFRAN) 4 MG tablet Take 4 mg by mouth as needed for nausea or vomiting. Up to 3 times daily.   OXYGEN 2LPM for dyspnea or SOB   senna (SENNA-TIME) 8.6 MG tablet TAKE 1 TABLET BY MOUTH 2 TIMES DAILY   Facility-Administered Encounter Medications as of 08/25/2023  Medication   heparin lock flush 100 unit/mL   sodium chloride flush (NS) 0.9 % injection 10 mL    Review of Systems  Immunization History  Administered Date(s) Administered   Influenza, High Dose Seasonal PF 07/10/2022   Influenza-Unspecified 07/11/2021   Moderna Covid-19 Fall Seasonal Vaccine 92yrs & older 12/31/2022   Moderna Sars-Covid-2 Vaccination 10/05/2019, 11/02/2019   PNEUMOCOCCAL CONJUGATE-20 03/04/2023   Pneumococcal Polysaccharide-23 01/26/2008, 03/14/2015   Tdap 02/12/2023   Unspecified SARS-COV-2 Vaccination 10/05/2019, 11/02/2019, 11/14/2020, 03/20/2021, 06/15/2021, 02/19/2022, 04/24/2022, 08/02/2022   Zoster Recombinant(Shingrix) 12/24/2017, 04/07/2018   Pertinent  Health Maintenance Due  Topic Date Due   DEXA SCAN  Never done   INFLUENZA VACCINE  04/24/2023      01/07/2022    8:39 PM 01/08/2022   10:00 AM 01/08/2022    7:30 PM 01/09/2022   11:32 AM 04/24/2022    1:19 PM  Fall Risk  (RETIRED) Patient Fall Risk Level High fall risk High fall risk High fall risk High fall risk High fall risk   Functional Status Survey:    Vitals:   08/25/23 0832  BP: (!) 142/65  Pulse: 84  Resp: 16  Temp: 98.2 F (36.8 C)  SpO2: 96%  Weight: 119 lb 1.6 oz (54 kg)   Body mass index is 23.26 kg/m. Physical Exam Constitutional:      Appearance: Normal appearance.  Cardiovascular:     Rate  and Rhythm: Normal rate and regular rhythm.     Pulses: Normal pulses.     Heart sounds: Normal heart sounds.  Pulmonary:     Effort: Pulmonary effort is normal.  Abdominal:     General: Abdomen is flat. Bowel sounds are normal.     Palpations: Abdomen is soft.  Musculoskeletal:        General: No swelling or tenderness.  Skin:    General: Skin is warm and dry.  Neurological:     Mental Status: She is alert and oriented to person, place, and time.     Gait: Gait normal.  Psychiatric:        Mood and Affect: Mood normal.     Labs reviewed: Recent Labs    10/23/22  1511 02/27/23 0000 04/23/23 0844  NA 134* 137 134*  K 3.9 4.3 3.9  CL 101 102 103  CO2 24 27* 24  GLUCOSE 130*  --  100*  BUN 25* 16 26*  CREATININE 0.63 0.7 0.70  CALCIUM 8.9 9.4 9.3   Recent Labs    10/23/22 1511 04/23/23 0844  AST 20 20  ALT 11 12  ALKPHOS 124 112  BILITOT 0.2* 0.3  PROT 7.3 7.5  ALBUMIN 4.1 4.1   Recent Labs    10/23/22 1511 02/27/23 0000 04/23/23 0844  WBC 7.2 6.0 6.4  NEUTROABS 4.3 3,756.00 3.9  HGB 12.1 12.2 12.4  HCT 37.4 37 38.5  MCV 91.0  --  90.8  PLT 285 284 283   No results found for: "TSH" No results found for: "HGBA1C" No results found for: "CHOL", "HDL", "LDLCALC", "LDLDIRECT", "TRIG", "CHOLHDL"  Significant Diagnostic Results in last 30 days:  No results found.  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 Continue current medications. F/u labs. Constipation well-controlled at this time. Memory stable at this time. No signs of acute changes at this time. Continue aricept. Osteoporosis managed with fosamax at this time. Adherent to using rolling walker at this time.   Family/ staff Communication: nursing  Labs/tests  ordered:  CBC, CMP

## 2023-08-26 ENCOUNTER — Encounter: Payer: Self-pay | Admitting: Student

## 2023-08-28 LAB — BASIC METABOLIC PANEL WITH GFR
BUN: 19 (ref 4–21)
CO2: 28 — AB (ref 13–22)
Chloride: 100 (ref 99–108)
Creatinine: 0.7 (ref 0.5–1.1)
Glucose: 106
Potassium: 4 meq/L (ref 3.5–5.1)
Sodium: 137 (ref 137–147)

## 2023-08-28 LAB — COMPREHENSIVE METABOLIC PANEL WITH GFR
Calcium: 9.6 (ref 8.7–10.7)
eGFR: 83

## 2023-10-17 ENCOUNTER — Encounter: Payer: Self-pay | Admitting: Internal Medicine

## 2023-10-24 ENCOUNTER — Inpatient Hospital Stay: Payer: Medicare Other | Attending: Internal Medicine

## 2023-10-24 ENCOUNTER — Inpatient Hospital Stay (HOSPITAL_BASED_OUTPATIENT_CLINIC_OR_DEPARTMENT_OTHER): Payer: Medicare Other | Admitting: Internal Medicine

## 2023-10-24 ENCOUNTER — Encounter: Payer: Self-pay | Admitting: Internal Medicine

## 2023-10-24 VITALS — BP 129/66 | HR 78 | Temp 98.0°F | Ht 60.0 in | Wt 122.2 lb

## 2023-10-24 DIAGNOSIS — Z1231 Encounter for screening mammogram for malignant neoplasm of breast: Secondary | ICD-10-CM

## 2023-10-24 DIAGNOSIS — Z8572 Personal history of non-Hodgkin lymphomas: Secondary | ICD-10-CM | POA: Diagnosis present

## 2023-10-24 DIAGNOSIS — Z9221 Personal history of antineoplastic chemotherapy: Secondary | ICD-10-CM | POA: Diagnosis not present

## 2023-10-24 DIAGNOSIS — C8599 Non-Hodgkin lymphoma, unspecified, extranodal and solid organ sites: Secondary | ICD-10-CM

## 2023-10-24 DIAGNOSIS — Z95828 Presence of other vascular implants and grafts: Secondary | ICD-10-CM

## 2023-10-24 LAB — CBC WITH DIFFERENTIAL (CANCER CENTER ONLY)
Abs Immature Granulocytes: 0.04 10*3/uL (ref 0.00–0.07)
Basophils Absolute: 0 10*3/uL (ref 0.0–0.1)
Basophils Relative: 1 %
Eosinophils Absolute: 0.1 10*3/uL (ref 0.0–0.5)
Eosinophils Relative: 1 %
HCT: 36.8 % (ref 36.0–46.0)
Hemoglobin: 12 g/dL (ref 12.0–15.0)
Immature Granulocytes: 1 %
Lymphocytes Relative: 29 %
Lymphs Abs: 1.9 10*3/uL (ref 0.7–4.0)
MCH: 29.9 pg (ref 26.0–34.0)
MCHC: 32.6 g/dL (ref 30.0–36.0)
MCV: 91.5 fL (ref 80.0–100.0)
Monocytes Absolute: 0.4 10*3/uL (ref 0.1–1.0)
Monocytes Relative: 7 %
Neutro Abs: 4.1 10*3/uL (ref 1.7–7.7)
Neutrophils Relative %: 61 %
Platelet Count: 281 10*3/uL (ref 150–400)
RBC: 4.02 MIL/uL (ref 3.87–5.11)
RDW: 13.3 % (ref 11.5–15.5)
WBC Count: 6.5 10*3/uL (ref 4.0–10.5)
nRBC: 0 % (ref 0.0–0.2)

## 2023-10-24 LAB — CMP (CANCER CENTER ONLY)
ALT: 13 U/L (ref 0–44)
AST: 20 U/L (ref 15–41)
Albumin: 4 g/dL (ref 3.5–5.0)
Alkaline Phosphatase: 96 U/L (ref 38–126)
Anion gap: 7 (ref 5–15)
BUN: 20 mg/dL (ref 8–23)
CO2: 25 mmol/L (ref 22–32)
Calcium: 9.1 mg/dL (ref 8.9–10.3)
Chloride: 102 mmol/L (ref 98–111)
Creatinine: 0.6 mg/dL (ref 0.44–1.00)
GFR, Estimated: >60 mL/min (ref >60)
Glucose, Bld: 125 mg/dL — ABNORMAL HIGH (ref 70–99)
Potassium: 3.8 mmol/L (ref 3.5–5.1)
Sodium: 134 mmol/L — ABNORMAL LOW (ref 135–145)
Total Bilirubin: 0.5 mg/dL (ref 0.0–1.2)
Total Protein: 7.2 g/dL (ref 6.5–8.1)

## 2023-10-24 LAB — LACTATE DEHYDROGENASE: LDH: 167 U/L (ref 98–192)

## 2023-10-24 MED ORDER — HEPARIN SOD (PORK) LOCK FLUSH 100 UNIT/ML IV SOLN
500.0000 [IU] | Freq: Once | INTRAVENOUS | Status: AC
  Administered 2023-10-24: 500 [IU] via INTRAVENOUS
  Filled 2023-10-24: qty 5

## 2023-10-24 MED ORDER — SODIUM CHLORIDE 0.9% FLUSH
10.0000 mL | Freq: Once | INTRAVENOUS | Status: AC
  Administered 2023-10-24: 10 mL via INTRAVENOUS
  Filled 2023-10-24: qty 10

## 2023-10-24 NOTE — Progress Notes (Signed)
[facility] CONSULT NOTE  Patient Care Team: [name], MD as PCP - General (Family Medicine) Earna Coder, MD as Consulting Physician (Oncology) Sharon Seller, NP as Nurse Practitioner (Geriatric Medicine)  CHIEF COMPLAINTS/PURPOSE OF CONSULTATION: Breast lymphoma   Oncology History Overview Note  # AUG 2020- LEFT BREAST DLBCL [non-GCB subtype; Double expressor;NEG- CD-30; Ki-67-80-90%]; FISH -negative for translocations; SEP 2020- PET-left breast intense uptake; no distant disease noted.  Hold off bone marrow biopsy [patient/family preference not to be aggressive]; Stage adjusted IPI-"2 points" cure rate 50-60%.   # 9/21- Pred+Rituxan; OCT 2020- mini-R-CHOP; s/p radiation [feb 19th, 2021] -------------------------------------------------------------------------------------------- # 2006- DLBCL of Breast [s? 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   Stephanie Davidson 89 y.o.  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  No complaints of any breast discomfort. No swollen lymph nodes. Appetite is good. Energy is good. Denies any new lumps or bumps.   No nausea vomiting.  No fevers no chills.  Review of Systems  Constitutional:  Negative for chills, diaphoresis, fever and malaise/fatigue.  HENT:  Negative for nosebleeds and sore throat.   Eyes:  Negative for double vision.   Respiratory:  Negative for cough, hemoptysis, sputum production, shortness of breath and wheezing.   Cardiovascular:  Negative for chest pain, palpitations, orthopnea and leg swelling.  Gastrointestinal:  Negative for abdominal pain, blood in stool, constipation, diarrhea, heartburn, melena, nausea and vomiting.  Genitourinary:  Negative for dysuria, frequency and urgency.  Musculoskeletal:  Positive for back pain and joint pain.  Skin: Negative.  Negative for itching and rash.  Neurological:  Negative for dizziness, tingling, focal weakness, weakness and headaches.  Endo/Heme/Allergies:  Does not bruise/bleed easily.  Psychiatric/Behavioral:  Positive for memory loss. Negative for depression. The patient is not nervous/anxious and does not have insomnia.      MEDICAL HISTORY:  Past Medical History:  Diagnosis Date   Alzheimer's dementia (HCC)    Breast cancer (HCC)    Lymphoma--- 2004 and again in 2020 (chemo and RT twice)   GERD (gastroesophageal reflux disease)    Hypertension    Personal history of chemotherapy    Personal history of radiation therapy    Sleep disturbance     SURGICAL HISTORY: Past Surgical History:  Procedure Laterality Date   BREAST BIOPSY Right ?   needle bx-benign   BREAST BIOPSY Left 05/12/2019   [place] bx 12:00, venus marker, DIFFUSE LARGE B CELL LYMPHOMA   BREAST EXCISIONAL BIOPSY Left 2003   lymphoma   BREAST EXCISIONAL BIOPSY Left ?   lump was removed, benign   HIP ARTHROPLASTY Left 01/07/2022   Procedure: ARTHROPLASTY BIPOLAR HIP (HEMIARTHROPLASTY);  Surgeon: Signa Kell, MD;  Location: [facility] ORS;  Service: Orthopedics;  Laterality: Left;   IR IMAGING GUIDED PORT INSERTION  06/02/2019    SOCIAL HISTORY: Social History   Socioeconomic History   Marital status: Widowed  Spouse name: Not on file   Number of children: 2   Years of education: Not on file   Highest education level: Not on file  Occupational History   Occupation: Bank  Teller---retired  Tobacco Use   Smoking status: Never   Smokeless tobacco: Never  Vaping Use   Vaping status: Never Used  Substance and Sexual Activity   Alcohol use: No   Drug use: Never   Sexual activity: Not on file  Other Topics Concern   Not on file  Social History Narrative   Widowed 2001   1 daughter in [place]   1 son in [place] care POA      Would accept resuscitation   Prefers no feeding tube   Social Drivers of Health   Financial Resource Strain: Low Risk  (06/01/2019)   Overall Financial Resource Strain (CARDIA)    Difficulty of Paying Living Expenses: Not hard at all  Food Insecurity: No Food Insecurity (06/01/2019)   Hunger Vital Sign    Worried About Running Out of Food in the Last Year: Never true    Ran Out of Food in the Last Year: Never true  Transportation Needs: No Transportation Needs (06/01/2019)   PRAPARE - Administrator, Civil Service (Medical): No    Lack of Transportation (Non-Medical): No  Physical Activity: Sufficiently Active (06/01/2019)   Exercise Vital Sign    Days of Exercise per Week: 7 days    Minutes of Exercise per Session: 30 min  Stress: No Stress Concern Present (06/01/2019)   [employer] of Occupational Health - Occupational Stress Questionnaire    Feeling of Stress : Only a little  Social Connections: Unknown (06/01/2019)   Social Connection and Isolation Panel [NHANES]    Frequency of Communication with Friends and Family: More than three times a week    Frequency of Social Gatherings with Friends and Family: More than three times a week    Attends Religious Services: Not on Marketing executive or Organizations: No    Attends Banker Meetings: Never    Marital Status: Not on file  Intimate Partner Violence: Not At Risk (06/01/2019)   Humiliation, Afraid, Rape, and Kick questionnaire    Fear of Current or Ex-Partner: No    Emotionally Abused: No    Physically Abused: No    Sexually  Abused: No    FAMILY HISTORY: Family History  Problem Relation Age of Onset   Heart attack Mother    Cancer Father    Motor neuron disease Brother    Breast cancer Neg Hx     ALLERGIES:  is allergic to latex.  MEDICATIONS:  Current Outpatient Medications  Medication Sig Dispense Refill   acetaminophen (TYLENOL) 500 MG tablet Take 1,000 mg by mouth daily.     alendronate (FOSAMAX) 70 MG tablet Take 1 tablet (70 mg total) by mouth every 7 (seven) days. Take with a full glass of water on an empty stomach. 4 tablet 11   amLODipine (NORVASC) 10 MG tablet TAKE 1 TABLET BY MOUTH DAILY 90 tablet 1   donepezil (ARICEPT) 10 MG tablet TAKE ONE TABLET BY MOUTH AT BEDTIME 90 tablet 3   fluticasone (FLONASE) 50 MCG/ACT nasal spray Place 2 sprays into both nostrils daily.     losartan (COZAAR) 100 MG tablet TAKE ONE TABLET BY MOUTH AT BEDTIME 90 tablet 2   melatonin 5 MG TABS Take 5 mg by mouth at bedtime.  senna (SENNA-TIME) 8.6 MG tablet TAKE 1 TABLET BY MOUTH 2 TIMES DAILY 180 tablet 1   No current facility-administered medications for this visit.   Facility-Administered Medications Ordered in Other Visits  Medication Dose Route Frequency Provider Last Rate Last Admin   heparin lock flush 100 unit/mL  500 Units Intravenous Once Durenda Hurt E, NP       sodium chloride flush (NS) 0.9 % injection 10 mL  10 mL Intravenous PRN Mauro Kaufmann, NP          .  PHYSICAL EXAMINATION: ECOG PERFORMANCE STATUS: 1 - Symptomatic but completely ambulatory  Vitals:   10/24/23 0951  BP: 129/66  Pulse: 78  Temp: 98 F (36.7 C)  SpO2: 99%      Filed Weights   10/24/23 0951  Weight: 122 lb 3.2 oz (55.4 kg)    Left BREAST exam [in the presence of nurse]- no unusual skin changes or dominant masses felt. Surgical scars noted.    Physical Exam HENT:     Head: Normocephalic and atraumatic.     Mouth/Throat:     Pharynx: No oropharyngeal exudate.  Eyes:     Pupils: Pupils are  equal, round, and reactive to light.  Cardiovascular:     Rate and Rhythm: Normal rate and regular rhythm.  Pulmonary:     Effort: Pulmonary effort is normal. No respiratory distress.     Breath sounds: Normal breath sounds. No wheezing.  Abdominal:     General: Bowel sounds are normal. There is no distension.     Palpations: Abdomen is soft. There is no mass.     Tenderness: There is no abdominal tenderness. There is no guarding or rebound.  Musculoskeletal:        General: No tenderness. Normal range of motion.     Cervical back: Normal range of motion and neck supple.  Skin:    General: Skin is warm.  Neurological:     Mental Status: She is alert and oriented to person, place, and time.  Psychiatric:        Mood and Affect: Affect normal.      LABORATORY DATA:  I have reviewed the data as listed Lab Results  Component Value Date   WBC 6.5 10/24/2023   HGB 12.0 10/24/2023   HCT 36.8 10/24/2023   MCV 91.5 10/24/2023   PLT 281 10/24/2023   Recent Labs    02/27/23 0000 04/23/23 0844 10/24/23 1004  NA 137 134* 134*  K 4.3 3.9 3.8  CL 102 103 102  CO2 27* 24 25  GLUCOSE  --  100* 125*  BUN 16 26* 20  CREATININE 0.7 0.70 0.60  CALCIUM 9.4 9.3 9.1  GFRNONAA  --  >60 >60  PROT  --  7.5 7.2  ALBUMIN  --  4.1 4.0  AST  --  20 20  ALT  --  12 13  ALKPHOS  --  112 96  BILITOT  --  0.3 0.5    RADIOGRAPHIC STUDIES: I have personally reviewed the radiological images as listed and agreed with the findings in the report. No results found.  ASSESSMENT & PLAN:   Lymphoma of breast (HCC) # Diffuse large B-cell lymphoma left breast Non-GCB subtype; double expresser. Stage I E. S/p 4- R-mini CHOP; s/p consolidation radiation [finished Feb 19th 2021]. AUG 2021- PET scan-complete response noted in the left breast; slight uptake noted in the mediastinal lymph nodes/clinically reactive.   AUG 2024- mammogram negative for any recurrent  disease.  Clinically stable. awaiting  mammogram August 2025   # Dementia:continues to be fairly independent-  Stable   # port/IV access-stable.  Port flush today.  Discussed explantation; discussed pro and cons- pt's is interested in  port removal - refer to IR  # DISPOSITION: # screening mamogram in aug 2025 # port removal - refer to IR # follow up in 6 months- MD;  labs- cbc/cmp/[facility]-Dr.B  Cc; Dr.Baboff  All questions were answered. The patient knows to call the clinic with any problems, questions or concerns.    Earna Coder, MD 10/24/2023 11:07 AM

## 2023-10-24 NOTE — Assessment & Plan Note (Addendum)
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 Stage I E. S/p 4- R-mini CHOP; s/p consolidation radiation [finished Feb 19th 2021]. AUG 2021- PET scan-complete response noted in the left breast; slight uptake noted in the mediastinal lymph nodes/clinically reactive.   AUG 2024- mammogram negative for any recurrent disease.  Clinically stable. awaiting mammogram August 2025   # Dementia:continues to be fairly independent-  Stable   # port/IV access-stable.  Port flush today.  Discussed explantation; discussed pro and cons- pt's is interested in  port removal - refer to IR  # DISPOSITION: # screening mamogram in aug 2025 # port removal - refer to IR # follow up in 6 months- MD;  labs- cbc/cmp/[facility]-Dr.B  Cc; [facility]

## 2023-10-24 NOTE — Progress Notes (Signed)
 No concerns today

## 2023-10-28 ENCOUNTER — Other Ambulatory Visit: Payer: Self-pay | Admitting: Student

## 2023-10-28 NOTE — Telephone Encounter (Signed)
This patient does not have an upcoming appointment, when should she be seen again?

## 2023-10-29 ENCOUNTER — Other Ambulatory Visit: Payer: Self-pay

## 2023-10-29 DIAGNOSIS — C8599 Non-Hodgkin lymphoma, unspecified, extranodal and solid organ sites: Secondary | ICD-10-CM

## 2023-10-30 ENCOUNTER — Telehealth: Payer: Self-pay

## 2023-10-30 NOTE — Telephone Encounter (Signed)
 Message from IR Clarita Ricker, Re: port removal-Hey Alfonso, I can put her on Tues 2/11 at 2p an arrive at 1:30p with local anes but check in at the Heart and Vascular entrance. Let me know. Thanks   My reply: I notified the daughter, who is not local about appt, she will contact [facility] and if any changes to this date and time need to be changed, she will call us  back.

## 2023-10-31 ENCOUNTER — Telehealth: Payer: Self-pay | Admitting: *Deleted

## 2023-10-31 NOTE — Telephone Encounter (Signed)
 The daughter called to say she wanted to make sure instructions how to get to the heart and vascular area.  Her mother is going to get her port out on 2/11 and and there is going to be a CMA that comes with patient and takes her back to [place].  She understands and so she will tell the directions to the staff that this would be with the patient

## 2023-11-03 ENCOUNTER — Other Ambulatory Visit: Payer: Self-pay | Admitting: Radiology

## 2023-11-04 ENCOUNTER — Ambulatory Visit
Admission: RE | Admit: 2023-11-04 | Discharge: 2023-11-04 | Disposition: A | Discharge status: Home / Self Care | Payer: Medicare Other | Source: Ambulatory Visit | Attending: Internal Medicine | Admitting: Radiology

## 2023-11-04 DIAGNOSIS — C8599 Non-Hodgkin lymphoma, unspecified, extranodal and solid organ sites: Secondary | ICD-10-CM | POA: Diagnosis present

## 2023-11-04 HISTORY — PX: IR REMOVAL TUN ACCESS W/ PORT W/O FL MOD SED: IMG2290

## 2023-11-04 MED ORDER — LIDOCAINE-EPINEPHRINE 1 %-1:100000 IJ SOLN
10.0000 mL | Freq: Once | INTRAMUSCULAR | Status: AC
  Administered 2023-11-04: 10 mL via INTRADERMAL

## 2023-11-04 MED ORDER — LIDOCAINE-EPINEPHRINE 1 %-1:100000 IJ SOLN
INTRAMUSCULAR | Status: AC
  Filled 2023-11-04: qty 1

## 2023-11-04 NOTE — Procedures (Signed)
Interventional Radiology Procedure Note  Procedure: Removal of a right IJ approach single lumen PowerPort.    Complications: None  Recommendations:  - routine wound care - Do not submerge for 7 days - DC now  Signed,  Yvone Neu. Loreta Ave, DO

## 2023-11-18 ENCOUNTER — Other Ambulatory Visit: Payer: Self-pay | Admitting: Student

## 2023-11-18 DIAGNOSIS — I10 Essential (primary) hypertension: Secondary | ICD-10-CM

## 2023-11-18 DIAGNOSIS — E782 Mixed hyperlipidemia: Secondary | ICD-10-CM

## 2023-12-02 ENCOUNTER — Encounter: Payer: Self-pay | Admitting: Nurse Practitioner

## 2023-12-02 ENCOUNTER — Non-Acute Institutional Stay: Payer: Self-pay | Admitting: Nurse Practitioner

## 2023-12-02 DIAGNOSIS — F02A0 Dementia in other diseases classified elsewhere, mild, without behavioral disturbance, psychotic disturbance, mood disturbance, and anxiety: Secondary | ICD-10-CM

## 2023-12-02 DIAGNOSIS — I10 Essential (primary) hypertension: Secondary | ICD-10-CM | POA: Diagnosis not present

## 2023-12-02 DIAGNOSIS — M810 Age-related osteoporosis without current pathological fracture: Secondary | ICD-10-CM | POA: Diagnosis not present

## 2023-12-02 DIAGNOSIS — M1991 Primary osteoarthritis, unspecified site: Secondary | ICD-10-CM | POA: Diagnosis not present

## 2023-12-02 DIAGNOSIS — C8599 Non-Hodgkin lymphoma, unspecified, extranodal and solid organ sites: Secondary | ICD-10-CM

## 2023-12-02 DIAGNOSIS — G479 Sleep disorder, unspecified: Secondary | ICD-10-CM

## 2023-12-02 NOTE — Progress Notes (Signed)
 Location:  Other ([facility] Room Number: [facility] Place of Service:  ALF (13)  [name], MD  Patient Care Team: [name], MD as PCP - General (Family Medicine) Earna Coder, MD as Consulting Physician (Oncology) Sharon Seller, NP as Nurse Practitioner (Geriatric Medicine)  Extended Emergency Contact Information Primary Emergency Contact: Cardinali,Melissa Address: [address]          [place], [place] [address] [place] of [place] Phone: [phone] Relation: Daughter Secondary Emergency Contact: Keast,Kim Address: [address] [place], [place] [address] D[place] of [place] Phone: [phone] Relation: Son  Goals of care: Advanced Directive information    10/24/2023    9:50 AM  Advanced Directives  Does Patient Have a Medical Advance Directive? Yes  Type of Estate agent of [place];Living will     Chief Complaint  Patient presents with   Medical Management of Chronic Issues   Discussed the use of AI scribe software for clinical note transcription with the patient, who gave verbal consent to proceed.  HPI:  Pt is a 89 y.o. female seen today for medical management of chronic disease.   She has a history of lymphoma of the breast, which is being monitored by oncology. Her last visit to the cancer center was in January.  She had a port for infusions, which has since been removed after completing treatment.  She is on medication for hypertension, taking amlodipine 10 mg daily and losartan 100 mg daily. Her blood pressure was noted to be slightly elevated at her last check.  She takes Aricept for mild dementia. She remains active, participating in morning exercises and walking in the afternoons.   No recent falls and no pain reported.  For osteoporosis, she takes Fosamax 70 mg weekly and engages in weight-bearing activities.  She experiences arthritis primarily in  her fingers and manages the condition with daily Tylenol.  She reports chronic sleep disturbance, averaging about five hours of sleep per night. She takes melatonin for sleep.   Past Medical History:  Diagnosis Date   Alzheimer's dementia (HCC)    Breast cancer (HCC)    Lymphoma--- 2004 and again in 2020 (chemo and RT twice)   GERD (gastroesophageal reflux disease)    Hypertension    Personal history of chemotherapy    Personal history of radiation therapy    Sleep disturbance    Past Surgical History:  Procedure Laterality Date   BREAST BIOPSY Right ?   needle bx-benign   BREAST BIOPSY Left 05/12/2019   [place] bx 12:00, venus marker, DIFFUSE LARGE B CELL LYMPHOMA   BREAST EXCISIONAL BIOPSY Left 2003   lymphoma   BREAST EXCISIONAL BIOPSY Left ?   lump was removed, benign   HIP ARTHROPLASTY Left 01/07/2022   Procedure: ARTHROPLASTY BIPOLAR HIP (HEMIARTHROPLASTY);  Surgeon: Signa Kell, MD;  Location: [facility] ORS;  Service: Orthopedics;  Laterality: Left;   IR IMAGING GUIDED PORT INSERTION  06/02/2019   IR REMOVAL TUN ACCESS W/ PORT W/O FL MOD SED  11/04/2023    Allergies  Allergen Reactions   Latex Rash    Outpatient Encounter Medications as of 12/02/2023  Medication Sig   acetaminophen (TYLENOL) 500 MG tablet Take 1,000 mg by mouth daily.   alendronate (FOSAMAX) 70 MG tablet Take 1 tablet (70 mg total) by mouth every 7 (seven) days. Take with a full glass of water on an empty stomach.  amLODipine (NORVASC) 10 MG tablet TAKE 1 TABLET BY MOUTH DAILY   donepezil (ARICEPT) 10 MG tablet TAKE ONE TABLET BY MOUTH AT BEDTIME   fluticasone (FLONASE) 50 MCG/ACT nasal spray Place 2 sprays into both nostrils daily.   losartan (COZAAR) 100 MG tablet TAKE ONE TABLET BY MOUTH AT BEDTIME   melatonin 5 MG TABS Take 5 mg by mouth at bedtime.   SENNA-TIME 8.6 MG tablet TAKE 1 TABLET BY MOUTH 2 TIMES DAILY   Facility-Administered Encounter Medications as of 12/02/2023  Medication   heparin lock  flush 100 unit/mL   sodium chloride flush (NS) 0.9 % injection 10 mL    Review of Systems  Constitutional:  Negative for activity change, appetite change, fatigue and unexpected weight change.  HENT:  Negative for congestion and hearing loss.   Eyes: Negative.   Respiratory:  Negative for cough and shortness of breath.   Cardiovascular:  Negative for chest pain, palpitations and leg swelling.  Gastrointestinal:  Negative for abdominal pain, constipation and diarrhea.  Genitourinary:  Negative for difficulty urinating and dysuria.  Musculoskeletal:  Negative for arthralgias and myalgias.  Skin:  Negative for color change and wound.  Neurological:  Negative for dizziness and weakness.  Psychiatric/Behavioral:  Negative for agitation, behavioral problems and confusion.      Immunization History  Administered Date(s) Administered   Influenza, High Dose Seasonal PF 07/10/2022   Influenza-Unspecified 07/11/2021   Moderna Covid-19 Fall Seasonal Vaccine 6yrs & older 12/31/2022   Moderna Sars-Covid-2 Vaccination 10/05/2019, 11/02/2019   PNEUMOCOCCAL CONJUGATE-20 03/04/2023   Pneumococcal Polysaccharide-23 01/26/2008, 03/14/2015   Tdap 02/12/2023   Unspecified SARS-COV-2 Vaccination 10/05/2019, 11/02/2019, 11/14/2020, 03/20/2021, 06/15/2021, 02/19/2022, 04/24/2022, 08/02/2022   Zoster Recombinant(Shingrix) 12/24/2017, 04/07/2018   Pertinent  Health Maintenance Due  Topic Date Due   DEXA SCAN  Never done   INFLUENZA VACCINE  04/24/2023      01/07/2022    8:39 PM 01/08/2022   10:00 AM 01/08/2022    7:30 PM 01/09/2022   11:32 AM 04/24/2022    1:19 PM  Fall Risk  (RETIRED) Patient Fall Risk Level High fall risk High fall risk High fall risk High fall risk High fall risk   Functional Status Survey:    Vitals:   12/02/23 1536  BP: (!) 147/70  Pulse: 84  Resp: 18  Temp: 97.9 F (36.6 C)  Weight: 122 lb (55.3 kg)   Body mass index is 23.83 kg/m. Physical Exam Constitutional:       General: She is not in acute distress.    Appearance: She is well-developed. She is not diaphoretic.  HENT:     Head: Normocephalic and atraumatic.     Mouth/Throat:     Pharynx: No oropharyngeal exudate.  Eyes:     Conjunctiva/sclera: Conjunctivae normal.     Pupils: Pupils are equal, round, and reactive to light.  Cardiovascular:     Rate and Rhythm: Normal rate and regular rhythm.     Heart sounds: Normal heart sounds.  Pulmonary:     Effort: Pulmonary effort is normal.     Breath sounds: Normal breath sounds.  Abdominal:     General: Bowel sounds are normal.     Palpations: Abdomen is soft.  Musculoskeletal:     Cervical back: Normal range of motion and neck supple.     Right lower leg: No edema.     Left lower leg: No edema.  Skin:    General: Skin is warm and dry.  Neurological:  Mental Status: She is alert.  Psychiatric:        Mood and Affect: Mood normal.     Labs reviewed: Recent Labs    02/27/23 0000 04/23/23 0844 10/24/23 1004  NA 137 134* 134*  K 4.3 3.9 3.8  CL 102 103 102  CO2 27* 24 25  GLUCOSE  --  100* 125*  BUN 16 26* 20  CREATININE 0.7 0.70 0.60  CALCIUM 9.4 9.3 9.1   Recent Labs    04/23/23 0844 10/24/23 1004  AST 20 20  ALT 12 13  ALKPHOS 112 96  BILITOT 0.3 0.5  PROT 7.5 7.2  ALBUMIN 4.1 4.0   Recent Labs    02/27/23 0000 04/23/23 0844 10/24/23 1004  WBC 6.0 6.4 6.5  NEUTROABS 3,756.00 3.9 4.1  HGB 12.2 12.4 12.0  HCT 37 38.5 36.8  MCV  --  90.8 91.5  PLT 284 283 281   No results found for: "TSH" No results found for: "HGBA1C" No results found for: "CHOL", "HDL", "LDLCALC", "LDLDIRECT", "TRIG", "CHOLHDL"  Significant Diagnostic Results in last 30 days:  IR Removal Tun Access W/ Port W/O FL Result Date: 11/04/2023 INDICATION: 89 year old female referred for port catheter removal EXAM: REMOVAL RIGHT IJ VEIN PORT-A-CATH MEDICATIONS: None ANESTHESIA/SEDATION: Moderate (conscious) sedation was not employed during this  procedure. The patient's level of consciousness and vital signs were monitored continuously by radiology nursing throughout the procedure under my direct supervision. FLUOROSCOPY: None COMPLICATIONS: None PROCEDURE: Informed written consent was obtained from the patient after a thorough discussion of the procedural risks, benefits and alternatives. All questions were addressed. Maximal Sterile Barrier Technique was utilized including caps, mask, sterile gowns, sterile gloves, sterile drape, hand hygiene and skin antiseptic. A timeout was performed prior to the initiation of the procedure. Informed consent was obtained from the patient following an explanation of the procedure, risks, benefits and alternatives. The patient understands, agrees and consents for the procedure. All questions were addressed. A time out was performed prior to the initiation of the procedure. The patient was positioned in the operation suite in the supine position on a gantry. The previous scar on the right chest was generously infiltrated with 1% lidocaine for local anesthesia. Infiltration of the skin and subcutaneous tissues surrounding the port was performed. Using sharp and blunt dissection, the port apparatus and subcutaneous catheter were removed in their entirety. The port pocket was then closed with interrupted Vicryl layer and a running subcuticular with 4-0 Monocryl. The skin was sealed with Derma bond. A sterile dressing was placed. The patient tolerated the procedure well and remained hemodynamically stable throughout. No complications were encountered and no significant blood loss was encountered. IMPRESSION: Status post right IJ port catheter removal. Signed, Yvone Neu. Miachel Roux, RPVI [facility] Electronically Signed   By: Gilmer Mor D.O.   On: 11/04/2023 15:12    Assessment/Plan Hypertension Blood pressure slightly elevated, managed with amlodipine and  losartan. Goal is <140/90 mmHg.  - Continue amlodipine 10 mg daily and  losartan 100 mg daily.  Mild dementia Mild cognitive impairment, well-managed with Aricept. Reports good memory in social settings. - Continue Aricept 10 mg daily. - Encourage activities and exercises for cognitive function.  Osteoporosis Age-related osteoporosis managed with Fosamax. Engages in beneficial weight-bearing activities. - Continue Fosamax 70 mg weekly. - Encourage weight-bearing activities.  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 - Continue Tylenol daily for pain.  Lymphoma of the breast  Previously treated, in remission, clinically stable. Followed by oncology. - Continue routine oncology follow-up.  Sleep disturbance Chronic sleep disturbance, managed with melatonin. - Continue melatonin 5 mg at bedtime.   Stephanie Davidson. Biagio Borg [facility] [phone]

## 2024-01-06 ENCOUNTER — Other Ambulatory Visit: Payer: Self-pay | Admitting: Student

## 2024-01-06 NOTE — Telephone Encounter (Signed)
 This is an assisted living resident and refills are usually addressed through facility staff. I will forward to Valrie Gehrig, MD for further review

## 2024-02-17 ENCOUNTER — Other Ambulatory Visit: Payer: Self-pay | Admitting: Nurse Practitioner

## 2024-02-17 DIAGNOSIS — Z0000 Encounter for general adult medical examination without abnormal findings: Secondary | ICD-10-CM

## 2024-03-05 ENCOUNTER — Encounter: Payer: Self-pay | Admitting: Student

## 2024-03-05 ENCOUNTER — Non-Acute Institutional Stay: Payer: Self-pay | Admitting: Student

## 2024-03-05 DIAGNOSIS — F02A0 Dementia in other diseases classified elsewhere, mild, without behavioral disturbance, psychotic disturbance, mood disturbance, and anxiety: Secondary | ICD-10-CM | POA: Diagnosis not present

## 2024-03-05 DIAGNOSIS — S72002A Fracture of unspecified part of neck of left femur, initial encounter for closed fracture: Secondary | ICD-10-CM

## 2024-03-05 DIAGNOSIS — I10 Essential (primary) hypertension: Secondary | ICD-10-CM | POA: Diagnosis not present

## 2024-03-05 NOTE — Progress Notes (Signed)
 Location:  [facility] Room Number: [id number] Place of Service:  ALF [phone]) Provider:  Valrie Gehrig, MD  Patient Care Team: Valrie Gehrig, MD as PCP - General (Family Medicine) Gwyn Leos, MD as Consulting Physician (Oncology) Verma Gobble, NP as Nurse Practitioner (Geriatric Medicine)  Extended Emergency Contact Information Primary Emergency Contact: Cardinali,Melissa Address: [address]          [place], [place] [address] [place] Phone: [phone] Relation: Daughter Secondary Emergency Contact: Heinecke,Kim Address: [address] [place], [place] [address] [place] Mobile Phone: [phone] Relation: Son  Code Status:  Full Code  Goals of care: Advanced Directive information    10/24/2023    9:50 AM  Advanced Directives  Does Patient Have a Medical Advance Directive? Yes  Type of Estate agent of [place];Living will     Chief Complaint  Patient presents with   Medical Management of Chronic Issues    Routine visit. Discuss need for DEXA     HPI:  Pt is a 89 y.o. female seen today for medical management of chronic diseases.   History of Present Illness The patient is a 89 year old who presents for a routine follow-up visit.  She is doing well and is grateful for her ability to walk, talk, and participate in activities. She regularly attends exercise classes, including seated exercises in the morning and additional sessions on weekends.  She discusses a past issue with her leg, which has improved significantly. She uses a cream and a heating pad on the affected area every night and morning, which has been effective in managing her symptoms. Her past medical history includes a leg fracture approximately eight years ago, which occurred after a fall from the back of an SUV. She also recalls another fall in her kitchen at a different residence.  She reports good  nutritional intake, regular bowel movements, and normal urination. She wears support hose on both legs and props her feet up when she is in her room, as she is on her feet all day.  She lives in a supportive environment that keeps her active and nourished, and she expresses gratitude for the care she receives there.    Past Medical History:  Diagnosis Date   Alzheimer's dementia (HCC)    Breast cancer (HCC)    Lymphoma--- 2004 and again in 2020 (chemo and RT twice)   GERD (gastroesophageal reflux disease)    Hypertension    Personal history of chemotherapy    Personal history of radiation therapy    Sleep disturbance    Past Surgical History:  Procedure Laterality Date   BREAST BIOPSY Right ?   needle bx-benign   BREAST BIOPSY Left 05/12/2019   us  bx 12:00, venus marker, DIFFUSE LARGE B CELL LYMPHOMA   BREAST EXCISIONAL BIOPSY Left 2003   lymphoma   BREAST EXCISIONAL BIOPSY Left ?   lump was removed, benign   HIP ARTHROPLASTY Left 01/07/2022   Procedure: ARTHROPLASTY BIPOLAR HIP (HEMIARTHROPLASTY);  Surgeon: Lorri Rota, MD;  Location: [facility] ORS;  Service: Orthopedics;  Laterality: Left;   IR IMAGING GUIDED PORT INSERTION  06/02/2019   IR REMOVAL TUN ACCESS W/ PORT W/O FL MOD SED  11/04/2023    Allergies  Allergen Reactions   Latex Rash    Outpatient Encounter Medications as of 03/05/2024  Medication Sig   acetaminophen  (TYLENOL ) 500 MG tablet Take 1,000  mg by mouth daily. And 2 by mouth every 4 hours as needed for discomfort or elevated temp   alendronate  (FOSAMAX ) 70 MG tablet TAKE 1 TABLET EVERY 7 DAYS WITH A FULL GLASS OF WATER ON AN EMPTY STOMACH DO NOT LIE DOWN FOR AT LEAST 30 MIN   alum & mag hydroxide-simeth (MAALOX/MYLANTA) 200-200-20 MG/5 SUSP Give 2 Tbsp by mouth every 4 hours as needed for gas, indigestion, or upset stomach supervised self-administration Notify MD if no relief.   amLODipine  (NORVASC ) 10 MG tablet TAKE 1 TABLET BY MOUTH DAILY   benzonatate  (TESSALON) 100 MG capsule Take 100 mg by mouth 3 (three) times daily as needed for cough.   bismuth subsalicylate (PEPTO BISMOL) 262 MG/15ML suspension 10 cc by mouth as needed for loose stool supervised self-administration   donepezil  (ARICEPT ) 10 MG tablet TAKE ONE TABLET BY MOUTH AT BEDTIME   fluticasone (FLONASE) 50 MCG/ACT nasal spray Place 2 sprays into both nostrils daily.   Glucose 15 GM/32ML GEL Take 1 packet by mouth as needed (for low blood sugar).   guaifenesin (ROBITUSSIN) 100 MG/5ML syrup Take 200 mg by mouth every 4 (four) hours as needed for cough.   losartan (COZAAR) 100 MG tablet TAKE ONE TABLET BY MOUTH AT BEDTIME   magnesium hydroxide (MILK OF MAGNESIA) 400 MG/5ML suspension 2 tbsp by mouth as needed for constipation supervised self administration daily  CALL M D if no relief 3 days of continued use   melatonin 5 MG TABS Take 5 mg by mouth at bedtime.   nystatin powder Apply 1 Application topically as needed (for skin breakdown).   ondansetron  (ZOFRAN ) 4 MG tablet Take 4 mg by mouth as needed for nausea or vomiting.   SENNA-TIME 8.6 MG tablet TAKE 1 TABLET BY MOUTH 2 TIMES DAILY   No facility-administered encounter medications on file as of 03/05/2024.    Review of Systems  Immunization History  Administered Date(s) Administered   Influenza, High Dose Seasonal PF 07/10/2022   Influenza-Unspecified 07/11/2021   Moderna Covid-19 Fall Seasonal Vaccine 66yrs & older 12/31/2022, 01/02/2024   Moderna Sars-Covid-2 Vaccination 10/05/2019, 11/02/2019   PNEUMOCOCCAL CONJUGATE-20 03/04/2023   Pneumococcal Polysaccharide-23 01/26/2008, 03/14/2015   Tdap 02/12/2023   Unspecified SARS-COV-2 Vaccination 10/05/2019, 11/02/2019, 11/14/2020, 03/20/2021, 06/15/2021, 02/19/2022, 04/24/2022, 08/02/2022   Zoster Recombinant(Shingrix) 12/24/2017, 04/07/2018   Pertinent  Health Maintenance Due  Topic Date Due   DEXA SCAN  Never done   INFLUENZA VACCINE  04/23/2024      01/07/2022     8:39 PM 01/08/2022   10:00 AM 01/08/2022    7:30 PM 01/09/2022   11:32 AM 04/24/2022    1:19 PM  Fall Risk  (RETIRED) Patient Fall Risk Level High fall risk  High fall risk  High fall risk  High fall risk  High fall risk      Data saved with a previous flowsheet row definition   Functional Status Survey:    Vitals:   03/05/24 1338  BP: (!) 153/68  Pulse: 74  Weight: 124 lb (56.2 kg)  Height: 5' (1.524 m)   Body mass index is 24.22 kg/m. Physical Exam Constitutional:      Appearance: Normal appearance.   Cardiovascular:     Rate and Rhythm: Normal rate and regular rhythm.     Pulses: Normal pulses.     Heart sounds: Normal heart sounds.  Pulmonary:     Effort: Pulmonary effort is normal.  Abdominal:     General: Abdomen is flat. Bowel  sounds are normal.     Palpations: Abdomen is soft.   Musculoskeletal:        General: No swelling or tenderness.   Skin:    General: Skin is warm and dry.   Neurological:     Mental Status: She is alert and oriented to person, place, and time.     Gait: Gait normal.   Psychiatric:        Mood and Affect: Mood normal.     Labs reviewed: Recent Labs    04/23/23 0844 10/24/23 1004  NA 134* 134*  K 3.9 3.8  CL 103 102  CO2 24 25  GLUCOSE 100* 125*  BUN 26* 20  CREATININE 0.70 0.60  CALCIUM 9.3 9.1   Recent Labs    04/23/23 0844 10/24/23 1004  AST 20 20  ALT 12 13  ALKPHOS 112 96  BILITOT 0.3 0.5  PROT 7.5 7.2  ALBUMIN 4.1 4.0   Recent Labs    04/23/23 0844 10/24/23 1004  WBC 6.4 6.5  NEUTROABS 3.9 4.1  HGB 12.4 12.0  HCT 38.5 36.8  MCV 90.8 91.5  PLT 283 281   No results found for: TSH No results found for: HGBA1C No results found for: CHOL, HDL, LDLCALC, LDLDIRECT, TRIG, CHOLHDL  Significant Diagnostic Results in last 30 days:  No results found.  Assessment/Plan Hx of Leg fracture DOI 01/06/2022 Leg fracture approximately eight years ago after a fall from an SUV. Currently, the leg is  improved with the use of a topical cream and heating pad. No acute issues reported. She is grateful for her ability to walk and participate in activities despite the past injury. - Continue current regimen of topical cream and heating pad for leg care.  Hypertension Well-controlled on current management  Dementia Patient is doing well in assisted living. active and participates.   General Health Maintenance She regularly participates in seated exercise classes and maintains a positive outlook on her health and longevity. She appreciates her current living situation, which provides support and community. - Continue participation in exercise classes. - Maintain current living situation for support and community.  Family/ staff Communication: nursing  Labs/tests ordered:  none

## 2024-03-18 ENCOUNTER — Non-Acute Institutional Stay: Payer: Self-pay | Admitting: Nurse Practitioner

## 2024-03-18 ENCOUNTER — Encounter: Payer: Self-pay | Admitting: Nurse Practitioner

## 2024-03-18 DIAGNOSIS — Z0000 Encounter for general adult medical examination without abnormal findings: Secondary | ICD-10-CM

## 2024-03-18 NOTE — Progress Notes (Addendum)
 Subjective:   Stephanie Davidson is a 89 y.o. female who presents for Medicare Annual (Subsequent) preventive examination.  Visit Complete: In person TL AL   Cardiac Risk Factors include: advanced age (>88men, >48 women);hypertension     Objective:    Today's Vitals   03/18/24 1030  BP: 127/68  Pulse: 73  Resp: 17  Temp: 98.2 F (36.8 C)  SpO2: 96%  Weight: 121 lb (54.9 kg)  Height: 5' (1.524 m)   Body mass index is 23.63 kg/m.     10/24/2023    9:50 AM 05/06/2023    2:45 PM 04/23/2023    9:01 AM 04/23/2023    8:39 AM 02/19/2023   10:51 AM 02/07/2023    2:50 PM 11/07/2022    2:37 PM  Advanced Directives  Does Patient Have a Medical Advance Directive? Yes Yes Yes Yes Yes Yes Yes  Type of Estate agent of [place];Living will Out of facility DNR (pink MOST or yellow form) Healthcare Power of [place];Living will Healthcare Power of [place];Living will Out of facility DNR (pink MOST or yellow form) Out of facility DNR (pink MOST or yellow form) Out of facility DNR (pink MOST or yellow form)  Does patient want to make changes to medical advance directive?  No - Patient declined   No - Patient declined No - Patient declined No - Patient declined  Copy of Healthcare Power of Attorney in Chart?   Yes - validated most recent copy scanned in chart (See row information)      Pre-existing out of facility DNR order (yellow form or pink MOST form)     Pink MOST form placed in chart (order not valid for inpatient use)  Pink MOST form placed in chart (order not valid for inpatient use)    Current Medications (verified) Outpatient Encounter Medications as of 03/18/2024  Medication Sig   acetaminophen  (TYLENOL ) 325 MG tablet Take 650 mg by mouth every 4 (four) hours as needed.   acetaminophen  (TYLENOL ) 500 MG tablet Take 1,000 mg by mouth daily. And 2 by mouth every 4 hours as needed for discomfort or elevated temp   alendronate  (FOSAMAX ) 70 MG tablet TAKE 1 TABLET EVERY 7  DAYS WITH A FULL GLASS OF WATER ON AN EMPTY STOMACH DO NOT LIE DOWN FOR AT LEAST 30 MIN   alum & mag hydroxide-simeth (MAALOX/MYLANTA) 200-200-20 MG/5 SUSP Give 2 Tbsp by mouth every 4 hours as needed for gas, indigestion, or upset stomach supervised self-administration Notify MD if no relief.   amLODipine  (NORVASC ) 10 MG tablet TAKE 1 TABLET BY MOUTH DAILY   benzonatate (TESSALON) 100 MG capsule Take 100 mg by mouth 3 (three) times daily as needed for cough.   bismuth subsalicylate (PEPTO BISMOL) 262 MG/15ML suspension 10 cc by mouth as needed for loose stool supervised self-administration   carbamide peroxide (DEBROX) 6.5 % OTIC solution 5 drops daily as needed.   donepezil  (ARICEPT ) 10 MG tablet TAKE ONE TABLET BY MOUTH AT BEDTIME   fluticasone (FLONASE) 50 MCG/ACT nasal spray Place 2 sprays into both nostrils daily.   Glucose 15 GM/32ML GEL Take 1 packet by mouth as needed (for low blood sugar).   guaifenesin (ROBITUSSIN) 100 MG/5ML syrup Take 200 mg by mouth every 4 (four) hours as needed for cough.   losartan (COZAAR) 100 MG tablet TAKE ONE TABLET BY MOUTH AT BEDTIME   magnesium hydroxide (MILK OF MAGNESIA) 400 MG/5ML suspension 2 tbsp by mouth as needed for constipation supervised self  administration daily  CALL M D if no relief 3 days of continued use   melatonin 5 MG TABS Take 5 mg by mouth at bedtime.   nystatin powder Apply 1 Application topically as needed (for skin breakdown).   ondansetron  (ZOFRAN ) 4 MG tablet Take 4 mg by mouth as needed for nausea or vomiting.   OXYGEN Inhale into the lungs. 2lpm   SENNA-TIME 8.6 MG tablet TAKE 1 TABLET BY MOUTH 2 TIMES DAILY   No facility-administered encounter medications on file as of 03/18/2024.    Allergies (verified) Latex   History: Past Medical History:  Diagnosis Date   Alzheimer's dementia (HCC)    Breast cancer (HCC)    Lymphoma--- 2004 and again in 2020 (chemo and RT twice)   GERD (gastroesophageal reflux disease)     Hypertension    Personal history of chemotherapy    Personal history of radiation therapy    Sleep disturbance    Past Surgical History:  Procedure Laterality Date   BREAST BIOPSY Right ?   needle bx-benign   BREAST BIOPSY Left 05/12/2019   us  bx 12:00, venus marker, DIFFUSE LARGE B CELL LYMPHOMA   BREAST EXCISIONAL BIOPSY Left 2003   lymphoma   BREAST EXCISIONAL BIOPSY Left ?   lump was removed, benign   HIP ARTHROPLASTY Left 01/07/2022   Procedure: ARTHROPLASTY BIPOLAR HIP (HEMIARTHROPLASTY);  Surgeon: Tobie Priest, MD;  Location: [facility] ORS;  Service: Orthopedics;  Laterality: Left;   IR IMAGING GUIDED PORT INSERTION  06/02/2019   IR REMOVAL TUN ACCESS W/ PORT W/O FL MOD SED  11/04/2023   Family History  Problem Relation Age of Onset   Heart attack Mother    Cancer Father    Motor neuron disease Brother    Breast cancer Neg Hx    Social History   Socioeconomic History   Marital status: Widowed    Spouse name: Not on file   Number of children: 2   Years of education: Not on file   Highest education level: Not on file  Occupational History   Occupation: Bank Teller---retired  Tobacco Use   Smoking status: Never   Smokeless tobacco: Never  Vaping Use   Vaping status: Never Used  Substance and Sexual Activity   Alcohol use: No   Drug use: Never   Sexual activity: Not on file  Other Topics Concern   Not on file  Social History Narrative   Widowed 2001   1 daughter in [place]   1 son in [place] care POA      Would accept resuscitation   Prefers no feeding tube   Social Drivers of Health   Financial Resource Strain: Low Risk  (06/01/2019)   Overall Financial Resource Strain (CARDIA)    Difficulty of Paying Living Expenses: Not hard at all  Food Insecurity: No Food Insecurity (06/01/2019)   Hunger Vital Sign    Worried About Running Out of Food in the Last Year: Never true    Ran Out of Food in the Last Year: Never true  Transportation Needs: No  Transportation Needs (06/01/2019)   PRAPARE - Administrator, Civil Service (Medical): No    Lack of Transportation (Non-Medical): No  Physical Activity: Sufficiently Active (06/01/2019)   Exercise Vital Sign    Days of Exercise per Week: 7 days    Minutes of Exercise per Session: 30 min  Stress: No Stress Concern Present (06/01/2019)   [employer] of Occupational Health - Occupational Stress Questionnaire  Feeling of Stress : Only a little  Social Connections: Unknown (06/01/2019)   Social Connection and Isolation Panel    Frequency of Communication with Friends and Family: More than three times a week    Frequency of Social Gatherings with Friends and Family: More than three times a week    Attends Religious Services: Not on Marketing executive or Organizations: No    Attends Banker Meetings: Never    Marital Status: Not on file    Tobacco Counseling Counseling given: Not Answered   Clinical Intake:  Pre-visit preparation completed: Yes  Pain : No/denies pain     BMI - recorded: 23.63 Nutritional Status: BMI of 19-24  Normal  How often do you need to have someone help you when you read instructions, pamphlets, or other written materials from your doctor or pharmacy?: 1 - Never         Activities of Daily Living    03/18/2024    1:54 PM 03/18/2024    1:22 PM  In your present state of health, do you have any difficulty performing the following activities:  Hearing? 0 1  Vision? 0 0  Difficulty concentrating or making decisions? 0 0  Walking or climbing stairs? 0 0  Dressing or bathing? 1 0  Comment has help with showers   Doing errands, shopping? 0 0  Preparing Food and eating ? N N  Using the Toilet? N N  In the past six months, have you accidently leaked urine? Y Y  Do you have problems with loss of bowel control? N Y  Managing your Medications? N N  Comment  has helped  Managing your Finances? N N  Comment  son does  it  Medical laboratory scientific officer? N N    Patient Care Team: Abdul Fine, MD as PCP - General (Family Medicine) Rennie Cindy SAUNDERS, MD as Consulting Physician (Oncology) Caro [name] POUR, NP as Nurse Practitioner (Geriatric Medicine)  Indicate any recent Medical Services you may have received from other than [facility] providers in the past year (date may be approximate).     Assessment:   This is a routine wellness examination for [place].  Hearing/Vision screen No results found.   Goals Addressed   None    Depression Screen    03/18/2024    1:25 PM  PHQ 2/9 Scores  PHQ - 2 Score 0    Fall Risk    03/18/2024    1:25 PM  Fall Risk   Falls in the past year? 0  Number falls in past yr: 0    MEDICARE RISK AT HOME: Medicare Risk at Home Any stairs in or around the home?: No Home free of loose throw rugs in walkways, pet beds, electrical cords, etc?: Yes Adequate lighting in your home to reduce risk of falls?: Yes Life alert?: Yes Use of a cane, walker or w/c?: Yes Grab bars in the bathroom?: Yes Shower chair or bench in shower?: Yes Elevated toilet seat or a handicapped toilet?: Yes  TIMED UP AND GO:  Was the test performed?  No    Cognitive Function:        Immunizations Immunization History  Administered Date(s) Administered   Influenza, High Dose Seasonal PF 07/10/2022   Influenza-Unspecified 07/11/2021, 07/11/2023   Moderna Covid-19 Fall Seasonal Vaccine 43yrs & older 12/31/2022, 01/02/2024   Moderna Sars-Covid-2 Vaccination 10/05/2019, 11/02/2019   PNEUMOCOCCAL CONJUGATE-20 03/04/2023   Pneumococcal Polysaccharide-23 01/26/2008, 03/14/2015  Tdap 02/12/2023   Unspecified SARS-COV-2 Vaccination 10/05/2019, 11/02/2019, 11/14/2020, 03/20/2021, 06/15/2021, 02/19/2022, 04/24/2022, 08/02/2022   Zoster Recombinant(Shingrix) 12/24/2017, 04/07/2018    TDAP status: Up to date  Flu Vaccine status: Up to date  Pneumococcal vaccine status:  Up to date  Covid-19 vaccine status: Information provided on how to obtain vaccines.   Qualifies for Shingles Vaccine? No   Zostavax completed Yes   Shingrix Completed?: Yes  Screening Tests Health Maintenance  Topic Date Due   INFLUENZA VACCINE  04/23/2024   COVID-19 Vaccine (13 - Mixed Product risk 2024-25 season) 07/03/2024   Medicare Annual Wellness (AWV)  03/18/2025   DTaP/Tdap/Td (2 - Td or Tdap) 02/11/2033   Pneumococcal Vaccine: 50+ Years  Completed   DEXA SCAN  Completed   Zoster Vaccines- Shingrix  Completed   Hepatitis B Vaccines  Aged Out   HPV VACCINES  Aged Out   Meningococcal B Vaccine  Aged Out    Health Maintenance  There are no preventive care reminders to display for this patient.   Colorectal cancer screening: No longer required.   Mammogram status: No longer required due to age.  Bone Density status: Completed 02/13/2023. Results reflect: Bone density results: OSTEOPOROSIS. Repeat every 2 years.  Lung Cancer Screening: (Low Dose CT Chest recommended if Age 43-80 years, 20 pack-year currently smoking OR have quit w/in 15years.) does not qualify.    Additional Screening:  Hepatitis C Screening: does not qualify Vision Screening: Recommended annual ophthalmology exams for early detection of glaucoma and other disorders of the eye. Is the patient up to date with their annual eye exam?  No  Who is the provider or what is the name of the office in which the patient attends annual eye exams? [facility] If pt is not established with a provider, would they like to be referred to a provider to establish care? No .   Dental Screening: Recommended annual dental exams for proper oral hygiene   Community Resource Referral / Chronic Care Management: CRR required this visit?  No   CCM required this visit?  No     Plan:     I have personally reviewed and noted the following in the patient's chart:   Medical and social history Use of alcohol, tobacco  or illicit drugs  Current medications and supplements including opioid prescriptions. Patient is not currently taking opioid prescriptions. Functional ability and status Nutritional status Physical activity Advanced directives List of other physicians Hospitalizations, surgeries, and ER visits in previous 12 months Vitals Screenings to include cognitive, depression, and falls Referrals and appointments  In addition, I have reviewed and discussed with patient certain preventive protocols, quality metrics, and best practice recommendations. A written personalized care plan for preventive services as well as general preventive health recommendations were provided to patient.     [name] MARLA An, NP   03/18/2024

## 2024-03-18 NOTE — Patient Instructions (Signed)
  Stephanie Davidson , Thank you for taking time to come for your Medicare Wellness Visit. I appreciate your ongoing commitment to your health goals. Please review the following plan we discussed and let me know if I can assist you in the future.     This is a list of the screening recommended for you and due dates:  Health Maintenance  Topic Date Due   Flu Shot  04/23/2024   COVID-19 Vaccine (13 - Mixed Product risk 2024-25 season) 07/03/2024   Medicare Annual Wellness Visit  03/18/2025   DTaP/Tdap/Td vaccine (2 - Td or Tdap) 02/11/2033   Pneumococcal Vaccine for age over 56  Completed   DEXA scan (bone density measurement)  Completed   Zoster (Shingles) Vaccine  Completed   Hepatitis B Vaccine  Aged Out   HPV Vaccine  Aged Out   Meningitis B Vaccine  Aged Out

## 2024-04-06 ENCOUNTER — Other Ambulatory Visit: Payer: Self-pay | Admitting: Student

## 2024-04-13 ENCOUNTER — Other Ambulatory Visit: Payer: Self-pay | Admitting: Student

## 2024-04-19 ENCOUNTER — Other Ambulatory Visit: Payer: Self-pay | Admitting: Student

## 2024-04-19 NOTE — Telephone Encounter (Signed)
 Patient is at [facility] Pended Rx and sent to Dr. Abdul for approval.

## 2024-04-23 ENCOUNTER — Encounter: Payer: Self-pay | Admitting: Internal Medicine

## 2024-04-23 ENCOUNTER — Inpatient Hospital Stay: Payer: Medicare Other | Attending: Internal Medicine

## 2024-04-23 ENCOUNTER — Inpatient Hospital Stay (HOSPITAL_BASED_OUTPATIENT_CLINIC_OR_DEPARTMENT_OTHER): Payer: Medicare Other | Admitting: Internal Medicine

## 2024-04-23 VITALS — BP 142/60 | HR 73 | Temp 97.6°F | Resp 16 | Wt 118.7 lb

## 2024-04-23 DIAGNOSIS — Z923 Personal history of irradiation: Secondary | ICD-10-CM | POA: Insufficient documentation

## 2024-04-23 DIAGNOSIS — C8599 Non-Hodgkin lymphoma, unspecified, extranodal and solid organ sites: Secondary | ICD-10-CM

## 2024-04-23 DIAGNOSIS — Z9221 Personal history of antineoplastic chemotherapy: Secondary | ICD-10-CM | POA: Insufficient documentation

## 2024-04-23 DIAGNOSIS — F0390 Unspecified dementia without behavioral disturbance: Secondary | ICD-10-CM | POA: Insufficient documentation

## 2024-04-23 DIAGNOSIS — C833A Diffuse large b-cell lymphoma, in remission: Secondary | ICD-10-CM | POA: Insufficient documentation

## 2024-04-23 LAB — CMP (CANCER CENTER ONLY)
ALT: 13 U/L (ref 0–44)
AST: 21 U/L (ref 15–41)
Albumin: 4.3 g/dL (ref 3.5–5.0)
Alkaline Phosphatase: 109 U/L (ref 38–126)
Anion gap: 7 (ref 5–15)
BUN: 14 mg/dL (ref 8–23)
CO2: 26 mmol/L (ref 22–32)
Calcium: 9.5 mg/dL (ref 8.9–10.3)
Chloride: 101 mmol/L (ref 98–111)
Creatinine: 0.7 mg/dL (ref 0.44–1.00)
GFR, Estimated: >60 mL/min (ref >60)
Glucose, Bld: 115 mg/dL — ABNORMAL HIGH (ref 70–99)
Potassium: 4.2 mmol/L (ref 3.5–5.1)
Sodium: 134 mmol/L — ABNORMAL LOW (ref 135–145)
Total Bilirubin: 0.6 mg/dL (ref 0.0–1.2)
Total Protein: 7.7 g/dL (ref 6.5–8.1)

## 2024-04-23 LAB — CBC WITH DIFFERENTIAL (CANCER CENTER ONLY)
Abs Immature Granulocytes: 0.02 K/uL (ref 0.00–0.07)
Basophils Absolute: 0 K/uL (ref 0.0–0.1)
Basophils Relative: 0 %
Eosinophils Absolute: 0.1 K/uL (ref 0.0–0.5)
Eosinophils Relative: 1 %
HCT: 39.4 % (ref 36.0–46.0)
Hemoglobin: 12.7 g/dL (ref 12.0–15.0)
Immature Granulocytes: 0 %
Lymphocytes Relative: 29 %
Lymphs Abs: 2.1 K/uL (ref 0.7–4.0)
MCH: 30 pg (ref 26.0–34.0)
MCHC: 32.2 g/dL (ref 30.0–36.0)
MCV: 92.9 fL (ref 80.0–100.0)
Monocytes Absolute: 0.5 K/uL (ref 0.1–1.0)
Monocytes Relative: 6 %
Neutro Abs: 4.6 K/uL (ref 1.7–7.7)
Neutrophils Relative %: 64 %
Platelet Count: 158 K/uL (ref 150–400)
RBC: 4.24 MIL/uL (ref 3.87–5.11)
RDW: 13.7 % (ref 11.5–15.5)
WBC Count: 7.4 K/uL (ref 4.0–10.5)
nRBC: 0 % (ref 0.0–0.2)

## 2024-04-23 LAB — LACTATE DEHYDROGENASE: LDH: 176 U/L (ref 98–192)

## 2024-04-23 NOTE — Assessment & Plan Note (Addendum)
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 Stage I E. S/p 4- R-mini CHOP; s/p consolidation radiation [finished Feb 19th 2021]. AUG 2021- PET scan-complete response noted in the left breast; slight uptake noted in the mediastinal lymph nodes/clinically reactive.   AUG 2024- mammogram negative for any recurrent disease.  Clinically stable. awaiting mammogram 27th- August 2025   # Dementia:continues to be fairly independent-  Stable   # port/IV access-s/p explant- IR  # Patient most likely cured of lymphoma-I think would be reasonable to follow her for 1 more year and if no recurrent disease patient will be followed up with her primary care /especially given her age history of dementia etc.    # DISPOSITION: # follow up in 12 months- MD;  labs- [facility]-Dr.B  Cc; [facility]

## 2024-04-23 NOTE — Progress Notes (Signed)
 [facility] CONSULT NOTE  Patient Care Team: Abdul Fine, MD as PCP - General (Family Medicine) Rennie Stephanie SAUNDERS, MD as Consulting Physician (Oncology) Caro [name] POUR, NP as Nurse Practitioner (Geriatric Medicine)  CHIEF COMPLAINTS/PURPOSE OF CONSULTATION: Breast lymphoma   Oncology History Overview Note  # AUG 2020- LEFT BREAST DLBCL [non-GCB subtype; Double expressor;NEG- CD-30; Ki-67-80-90%]; FISH -negative for translocations; SEP 2020- PET-left breast intense uptake; no distant disease noted.  Hold off bone marrow biopsy [patient/family preference not to be aggressive]; Stage adjusted IPI-2 points cure rate 50-60%.   # 9/21- Pred+Rituxan ; OCT 2020- mini-R-CHOP; s/p radiation [feb 19th, 2021] -------------------------------------------------------------------------------------------- # 2006- DLBCL of Breast [s? 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   Stephanie Davidson 89 y.o.  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  Patient did feeling well. Happy with her health. Exercises every day. Appetite is normal. No new lumps. Bowels are normal. Sleeps 6 hrs per night. Ambulates with her walke   No complaints of any breast discomfort. No swollen lymph nodes. Appetite is good. Energy is good. Denies any new lumps or bumps.   No nausea vomiting.  No fevers no chills.  Review of  Systems  Constitutional:  Negative for chills, diaphoresis, fever and malaise/fatigue.  HENT:  Negative for nosebleeds and sore throat.   Eyes:  Negative for double vision.  Respiratory:  Negative for cough, hemoptysis, sputum production, shortness of breath and wheezing.   Cardiovascular:  Negative for chest pain, palpitations, orthopnea and leg swelling.  Gastrointestinal:  Negative for abdominal pain, blood in stool, constipation, diarrhea, heartburn, melena, nausea and vomiting.  Genitourinary:  Negative for dysuria, frequency and urgency.  Musculoskeletal:  Positive for back pain and joint pain.  Skin: Negative.  Negative for itching and rash.  Neurological:  Negative for dizziness, tingling, focal weakness, weakness and headaches.  Endo/Heme/Allergies:  Does not bruise/bleed easily.  Psychiatric/Behavioral:  Positive for memory loss. Negative for depression. The patient is not nervous/anxious and does not have insomnia.      MEDICAL HISTORY:  Past Medical History:  Diagnosis Date   Alzheimer's dementia (HCC)    Breast cancer (HCC)    Lymphoma--- 2004 and again in 2020 (chemo and RT twice)   GERD (gastroesophageal reflux disease)    Hypertension    Personal history of chemotherapy    Personal history of radiation therapy    Sleep disturbance     SURGICAL HISTORY: Past Surgical History:  Procedure Laterality Date   BREAST BIOPSY Right ?   needle bx-benign   BREAST BIOPSY Left 05/12/2019   us  bx 12:00, venus marker, DIFFUSE LARGE B CELL LYMPHOMA   BREAST EXCISIONAL BIOPSY Left 2003   lymphoma   BREAST EXCISIONAL BIOPSY Left ?   lump was removed, benign   HIP ARTHROPLASTY Left 01/07/2022   Procedure: ARTHROPLASTY BIPOLAR HIP (HEMIARTHROPLASTY);  Surgeon: Tobie Priest, MD;  Location: [facility] ORS;  Service: Orthopedics;  Laterality: Left;   IR IMAGING GUIDED PORT INSERTION  06/02/2019   IR REMOVAL TUN ACCESS W/ PORT W/O FL MOD SED  11/04/2023    SOCIAL HISTORY: Social History    Socioeconomic History   Marital status: Widowed    Spouse name: Not on file   Number of children: 2   Years of education: Not on file   Highest education level: Not on file  Occupational History   Occupation: Bank Teller---retired  Tobacco Use   Smoking status: Never   Smokeless tobacco: Never  Vaping Use   Vaping status: Never Used  Substance and Sexual Activity   Alcohol use: No   Drug use: Never   Sexual activity: Not on file  Other Topics Concern   Not on file  Social History Narrative   Widowed 2001   1 daughter in [place]   1 son in [place] care POA      Would accept resuscitation   Prefers no feeding tube   Social Drivers of Health   Financial Resource Strain: Low Risk  (06/01/2019)   Overall Financial Resource Strain (CARDIA)    Difficulty of Paying Living Expenses: Not hard at all  Food Insecurity: No Food Insecurity (06/01/2019)   Hunger Vital Sign    Worried About Running Out of Food in the Last Year: Never true    Ran Out of Food in the Last Year: Never true  Transportation Needs: No Transportation Needs (06/01/2019)   PRAPARE - Administrator, Civil Service (Medical): No    Lack of Transportation (Non-Medical): No  Physical Activity: Sufficiently Active (06/01/2019)   Exercise Vital Sign    Days of Exercise per Week: 7 days    Minutes of Exercise per Session: 30 min  Stress: No Stress Concern Present (06/01/2019)   [employer] of Occupational Health - Occupational Stress Questionnaire    Feeling of Stress : Only a little  Social Connections: Unknown (06/01/2019)   Social Connection and Isolation Panel    Frequency of Communication with Friends and Family: More than three times a week    Frequency of Social Gatherings with Friends and Family: More than three times a week    Attends Religious Services: Not on Insurance claims handler of Clubs or Organizations: No    Attends Banker Meetings: Never    Marital Status: Not  on file  Intimate Partner Violence: Not At Risk (06/01/2019)   Humiliation, Afraid, Rape, and Kick questionnaire    Fear of Current or Ex-Partner: No    Emotionally Abused: No    Physically Abused: No    Sexually Abused: No    FAMILY HISTORY: Family History  Problem Relation Age of Onset   Heart attack Mother    Cancer Father    Motor neuron disease Brother    Breast cancer Neg Hx     ALLERGIES:  is allergic to latex.  MEDICATIONS:  Current Outpatient Medications  Medication Sig Dispense Refill   acetaminophen  (TYLENOL ) 325 MG tablet Take 650 mg by mouth every 4 (four) hours as needed.     alendronate  (FOSAMAX ) 70 MG tablet TAKE 1 TABLET EVERY 7 DAYS WITH A FULL GLASS OF WATER ON AN EMPTY STOMACH DO NOT LIE DOWN FOR AT LEAST 30 MIN 4 tablet 11   amLODipine  (NORVASC ) 10 MG tablet TAKE 1 TABLET BY MOUTH DAILY 90 tablet 1   carbamide peroxide (DEBROX) 6.5 % OTIC solution 5 drops daily as  needed.     donepezil  (ARICEPT ) 10 MG tablet TAKE ONE TABLET BY MOUTH AT BEDTIME 90 tablet 3   fluticasone (FLONASE) 50 MCG/ACT nasal spray Place 2 sprays into both nostrils daily.     losartan (COZAAR) 100 MG tablet TAKE ONE TABLET BY MOUTH AT BEDTIME 90 tablet 2   melatonin 5 MG TABS Take 5 mg by mouth at bedtime.     nystatin powder Apply 1 Application topically as needed (for skin breakdown).     SENNA-TIME 8.6 MG tablet TAKE 1 TABLET BY MOUTH 2 TIMES DAILY 180 tablet 1   acetaminophen  (TYLENOL ) 500 MG tablet Take 1,000 mg by mouth daily. And 2 by mouth every 4 hours as needed for discomfort or elevated temp     magnesium hydroxide (MILK OF MAGNESIA) 400 MG/5ML suspension 2 tbsp by mouth as needed for constipation supervised self administration daily  CALL M D if no relief 3 days of continued use (Patient not taking: Reported on 04/23/2024)     No current facility-administered medications for this visit.      Stephanie Davidson  PHYSICAL EXAMINATION: ECOG PERFORMANCE STATUS: 1 - Symptomatic but completely  ambulatory  Vitals:   04/23/24 1010  BP: (!) 142/60  Pulse: 73  Resp: 16  Temp: 97.6 F (36.4 C)  SpO2: 100%      Filed Weights   04/23/24 1010  Weight: 118 lb 11.2 oz (53.8 kg)    Left BREAST exam [in the presence of nurse]- no unusual skin changes or dominant masses felt. Surgical scars noted.    Physical Exam HENT:     Head: Normocephalic and atraumatic.     Mouth/Throat:     Pharynx: No oropharyngeal exudate.  Eyes:     Pupils: Pupils are equal, round, and reactive to light.  Cardiovascular:     Rate and Rhythm: Normal rate and regular rhythm.  Pulmonary:     Effort: Pulmonary effort is normal. No respiratory distress.     Breath sounds: Normal breath sounds. No wheezing.  Abdominal:     General: Bowel sounds are normal. There is no distension.     Palpations: Abdomen is soft. There is no mass.     Tenderness: There is no abdominal tenderness. There is no guarding or rebound.  Musculoskeletal:        General: No tenderness. Normal range of motion.     Cervical back: Normal range of motion and neck supple.  Skin:    General: Skin is warm.  Neurological:     Mental Status: She is alert and oriented to person, place, and time.  Psychiatric:        Mood and Affect: Affect normal.      LABORATORY DATA:  I have reviewed the data as listed Lab Results  Component Value Date   WBC 7.4 04/23/2024   HGB 12.7 04/23/2024   HCT 39.4 04/23/2024   MCV 92.9 04/23/2024   PLT 158 04/23/2024   Recent Labs    10/24/23 1004 04/23/24 0948  NA 134* 134*  K 3.8 4.2  CL 102 101  CO2 25 26  GLUCOSE 125* 115*  BUN 20 14  CREATININE 0.60 0.70  CALCIUM 9.1 9.5  GFRNONAA >60 >60  PROT 7.2 7.7  ALBUMIN 4.0 4.3  AST 20 21  ALT 13 13  ALKPHOS 96 109  BILITOT 0.5 0.6    RADIOGRAPHIC STUDIES: I have personally reviewed the radiological images as listed and agreed with the findings in the report. No  results found.  ASSESSMENT & PLAN:   Lymphoma of breast  (HCC) # Diffuse large B-cell lymphoma left breast Non-GCB subtype; double expresser. Stage I E. S/p 4- R-mini CHOP; s/p consolidation radiation [finished Feb 19th 2021]. AUG 2021- PET scan-complete response noted in the left breast; slight uptake noted in the mediastinal lymph nodes/clinically reactive.   AUG 2024- mammogram negative for any recurrent disease.  Clinically stable. awaiting mammogram 27th- August 2025   # Dementia:continues to be fairly independent-  Stable   # port/IV access-s/p explant- IR  # Patient most likely cured of lymphoma-I think would be reasonable to follow her for 1 more year and if no recurrent disease patient will be followed up with her primary care /especially given her age history of dementia etc.    # DISPOSITION: # follow up in 12 months- MD;  labs- cbc/cmp/LD[facility]-Dr.B  Cc; Dr.Baboff  All questions were answered. The patient knows to call the clinic with any problems, questions or concerns.    Stephanie JONELLE Joe, MD 04/23/2024 10:38 AM

## 2024-04-23 NOTE — Progress Notes (Signed)
 Feeling well. Happy with her health. Exercises every day. Appetite is normal. No new lumps. Bowels are normal. Sleeps 6 hrs per night. Ambulates with her walker.

## 2024-05-19 ENCOUNTER — Ambulatory Visit
Admission: RE | Admit: 2024-05-19 | Discharge: 2024-05-19 | Disposition: A | Discharge status: Home / Self Care | Source: Ambulatory Visit | Attending: Internal Medicine | Admitting: Internal Medicine

## 2024-05-19 DIAGNOSIS — C8599 Non-Hodgkin lymphoma, unspecified, extranodal and solid organ sites: Secondary | ICD-10-CM | POA: Diagnosis present

## 2024-05-19 DIAGNOSIS — Z1231 Encounter for screening mammogram for malignant neoplasm of breast: Secondary | ICD-10-CM | POA: Insufficient documentation

## 2024-05-27 ENCOUNTER — Encounter: Payer: Self-pay | Admitting: Nurse Practitioner

## 2024-05-27 ENCOUNTER — Non-Acute Institutional Stay: Payer: Self-pay | Admitting: Nurse Practitioner

## 2024-05-27 DIAGNOSIS — F02A0 Dementia in other diseases classified elsewhere, mild, without behavioral disturbance, psychotic disturbance, mood disturbance, and anxiety: Secondary | ICD-10-CM | POA: Diagnosis not present

## 2024-05-27 DIAGNOSIS — M810 Age-related osteoporosis without current pathological fracture: Secondary | ICD-10-CM

## 2024-05-27 DIAGNOSIS — C8599 Non-Hodgkin lymphoma, unspecified, extranodal and solid organ sites: Secondary | ICD-10-CM

## 2024-05-27 DIAGNOSIS — K5900 Constipation, unspecified: Secondary | ICD-10-CM

## 2024-05-27 DIAGNOSIS — G479 Sleep disorder, unspecified: Secondary | ICD-10-CM

## 2024-05-27 DIAGNOSIS — I10 Essential (primary) hypertension: Secondary | ICD-10-CM

## 2024-05-27 DIAGNOSIS — M1991 Primary osteoarthritis, unspecified site: Secondary | ICD-10-CM

## 2024-05-27 NOTE — Assessment & Plan Note (Signed)
 Continues to follow up with oncologist routinely under surveillance at this time.

## 2024-05-27 NOTE — Assessment & Plan Note (Signed)
 Blood pressure controlled, Continue current medications and dietary modifications follow metabolic panel

## 2024-05-27 NOTE — Assessment & Plan Note (Signed)
 Controlled on tylenol  daily

## 2024-05-27 NOTE — Assessment & Plan Note (Signed)
 Stable, Continue donepezil  with supportive care.

## 2024-05-27 NOTE — Assessment & Plan Note (Signed)
 Started on fosamax  in 2024 Continue at this time with weight bearing exercises.

## 2024-05-27 NOTE — Assessment & Plan Note (Signed)
 Stable on melatonin

## 2024-05-27 NOTE — Progress Notes (Unsigned)
 Location:  Other Nursing Home Room Number: Stephanie Davidson [id number] Place of Service:  ALF (13)  Abdul Fine, MD  Patient Care Team: Abdul Fine, MD as PCP - General (Family Medicine) Rennie Cindy SAUNDERS, MD as Consulting Physician (Oncology) Caro [name] POUR, NP as Nurse Practitioner (Geriatric Medicine)  Extended Emergency Contact Information Primary Emergency Contact: Cardinali,Melissa Address: [address]          [place], [place] [address] [place] Phone: [phone] Relation: Daughter Secondary Emergency Contact: Cassada,Kim Address: [address] [place], [place] [address] [place] Mobile Phone: [phone] Relation: Son  Goals of care: Advanced Directive information    04/23/2024   10:01 AM  Advanced Directives  Does Patient Have a Medical Advance Directive? Yes  Type of Estate agent of [place];Living will  Does patient want to make changes to medical advance directive? No - Patient declined  Copy of Healthcare Power of Attorney in Chart? Yes - validated most recent copy scanned in chart (See row information)     Chief Complaint  Patient presents with   Medical Management of Chronic Issues    Medical Management of Chronic Issues.     HPI:  Pt is a 89 y.o. female seen today for medical management of chronic disease.  Currently residing in an assisted living facility, she receives her medications from the staff. She has been on Fosamax  since May 2024 for osteoporosis. She is not taking calcium or vitamin D supplements. No issues with medication adherence are reported as the staff administers them.  She is on Aricept  10 mg daily for memory, with no side effects such as vivid dreams or diarrhea. Sleep patterns are consistent, and she does not require daytime naps.  With a history of breast cancer, she is under oncological care and has completed treatment. She is on surveillance for  diffuse B cell lymphoma of the breast, with no new lumps, bumps, or breast pain.  She reports a good appetite, eating three meals a day, and avoiding excessive bread and sweets, though she enjoys a small amount of ice cream daily. She engages in daily exercise and feels fortunate about her current health status.  No pain, cough, congestion, allergies, constipation, or joint pain. She uses Flonase for allergies, administered nasally.   Past Medical History:  Diagnosis Date   Alzheimer's dementia (HCC)    Breast cancer (HCC)    Lymphoma--- 2004 and again in 2020 (chemo and RT twice)   GERD (gastroesophageal reflux disease)    Hypertension    Personal history of chemotherapy    Personal history of radiation therapy    Sleep disturbance    Past Surgical History:  Procedure Laterality Date   BREAST BIOPSY Right ?   needle bx-benign   BREAST BIOPSY Left 05/12/2019   us  bx 12:00, venus marker, DIFFUSE LARGE B CELL LYMPHOMA   BREAST EXCISIONAL BIOPSY Left 2003   lymphoma   BREAST EXCISIONAL BIOPSY Left ?   lump was removed, benign   HIP ARTHROPLASTY Left 01/07/2022   Procedure: ARTHROPLASTY BIPOLAR HIP (HEMIARTHROPLASTY);  Surgeon: Tobie Priest, MD;  Location: [facility] ORS;  Service: Orthopedics;  Laterality: Left;   IR IMAGING GUIDED PORT INSERTION  06/02/2019   IR REMOVAL TUN ACCESS W/ PORT W/O FL MOD SED  11/04/2023    Allergies  Allergen Reactions   Latex Rash    Outpatient Encounter Medications as of 05/27/2024  Medication Sig  acetaminophen  (TYLENOL ) 325 MG tablet Take 650 mg by mouth every 4 (four) hours as needed.   acetaminophen  (TYLENOL ) 500 MG tablet Take 1,000 mg by mouth daily. And 2 by mouth every 4 hours as needed for discomfort or elevated temp   alendronate  (FOSAMAX ) 70 MG tablet TAKE 1 TABLET EVERY 7 DAYS WITH A FULL GLASS OF WATER ON AN EMPTY STOMACH DO NOT LIE DOWN FOR AT LEAST 30 MIN   aluminum-magnesium hydroxide 200-200 MG/5ML suspension Take 5 mLs by mouth every 4  (four) hours as needed for indigestion.   amLODipine  (NORVASC ) 10 MG tablet TAKE 1 TABLET BY MOUTH DAILY   benzonatate (TESSALON) 100 MG capsule Take 100 mg by mouth 3 (three) times daily as needed for cough.   bismuth subsalicylate (PEPTO BISMOL) 262 MG/15ML suspension Take 10 mLs by mouth as needed.   carbamide peroxide (DEBROX) 6.5 % OTIC solution 5 drops daily as needed.   dextromethorphan-guaiFENesin (ROBITUSSIN-DM) 10-100 MG/5ML liquid Take 5 mLs by mouth every 4 (four) hours as needed for cough.   donepezil  (ARICEPT ) 10 MG tablet TAKE ONE TABLET BY MOUTH AT BEDTIME   fluticasone (FLONASE) 50 MCG/ACT nasal spray Place 2 sprays into both nostrils daily.   Glucose 15 GM/32ML GEL Take 1 packet by mouth as needed.   losartan (COZAAR) 100 MG tablet TAKE ONE TABLET BY MOUTH AT BEDTIME   magnesium hydroxide (MILK OF MAGNESIA) 400 MG/5ML suspension 2 tbsp by mouth as needed for constipation supervised self administration daily  CALL M D if no relief 3 days of continued use   melatonin 5 MG TABS Take 5 mg by mouth at bedtime.   nystatin powder Apply 1 Application topically as needed (for skin breakdown).   ondansetron  (ZOFRAN ) 4 MG tablet Take 4 mg by mouth 3 (three) times daily as needed for nausea or vomiting.   SENNA-TIME 8.6 MG tablet TAKE 1 TABLET BY MOUTH 2 TIMES DAILY   No facility-administered encounter medications on file as of 05/27/2024.    Review of Systems ***  Immunization History  Administered Date(s) Administered   INFLUENZA, HIGH DOSE SEASONAL PF 07/10/2022   Influenza-Unspecified 07/11/2021, 07/11/2023   Moderna Covid-19 Fall Seasonal Vaccine 76yrs & older 12/31/2022, 01/02/2024   Moderna Sars-Covid-2 Vaccination 10/05/2019, 11/02/2019   PNEUMOCOCCAL CONJUGATE-20 03/04/2023   Pneumococcal Polysaccharide-23 01/26/2008, 03/14/2015   Tdap 02/12/2023   Unspecified SARS-COV-2 Vaccination 10/05/2019, 11/02/2019, 11/14/2020, 03/20/2021, 06/15/2021, 02/19/2022, 04/24/2022,  08/02/2022   Zoster Recombinant(Shingrix) 12/24/2017, 04/07/2018   Pertinent  Health Maintenance Due  Topic Date Due   INFLUENZA VACCINE  04/23/2024   DEXA SCAN  Completed      01/08/2022   10:00 AM 01/08/2022    7:30 PM 01/09/2022   11:32 AM 04/24/2022    1:19 PM 03/18/2024    1:25 PM  Fall Risk  Falls in the past year?     0  (RETIRED) Patient Fall Risk Level High fall risk  High fall risk  High fall risk  High fall risk       Data saved with a previous flowsheet row definition   Functional Status Survey:    Vitals:   05/27/24 1102 05/27/24 1131  BP: (!) 142/70 131/72  Pulse: 83   Resp: 17   Temp: (!) 97.5 F (36.4 C)   SpO2: 98%   Weight: 119 lb 8 oz (54.2 kg)   Height: 5' (1.524 m)    Body mass index is 23.34 kg/m. Physical Exam***  Labs reviewed: Recent Labs  08/28/23 0000 10/24/23 1004 04/23/24 0948  NA 137 134* 134*  K 4.0 3.8 4.2  CL 100 102 101  CO2 28* 25 26  GLUCOSE  --  125* 115*  BUN 19 20 14   CREATININE 0.7 0.60 0.70  CALCIUM 9.6 9.1 9.5   Recent Labs    10/24/23 1004 04/23/24 0948  AST 20 21  ALT 13 13  ALKPHOS 96 109  BILITOT 0.5 0.6  PROT 7.2 7.7  ALBUMIN 4.0 4.3   Recent Labs    10/24/23 1004 04/23/24 0948  WBC 6.5 7.4  NEUTROABS 4.1 4.6  HGB 12.0 12.7  HCT 36.8 39.4  MCV 91.5 92.9  PLT 281 158   No results found for: TSH No results found for: HGBA1C No results found for: CHOL, HDL, LDLCALC, LDLDIRECT, TRIG, CHOLHDL  Significant Diagnostic Results in last 30 days:  MM 3D SCREENING MAMMOGRAM BILATERAL BREAST Result Date: 05/21/2024 CLINICAL DATA:  Screening. EXAM: DIGITAL SCREENING BILATERAL MAMMOGRAM WITH TOMOSYNTHESIS AND CAD TECHNIQUE: Bilateral screening digital craniocaudal and mediolateral oblique mammograms were obtained. Bilateral screening digital breast tomosynthesis was performed. The images were evaluated with computer-aided detection. COMPARISON:  Previous exam(s). ACR Breast Density Category  d: The breasts are extremely dense, which lowers the sensitivity of mammography. FINDINGS: There are no findings suspicious for malignancy. IMPRESSION: No mammographic evidence of malignancy. A result letter of this screening mammogram will be mailed directly to the patient. RECOMMENDATION: Screening mammogram in one year. (Code:SM-B-01Y) BI-RADS CATEGORY  1: Negative. Electronically Signed   By: Dina  Arceo M.D.   On: 05/21/2024 16:51    Assessment/Plan No problem-specific Assessment & Plan notes found for this encounter.     Stephanie Davidson K. Caro BODILY [facility] [phone]

## 2024-05-27 NOTE — Assessment & Plan Note (Signed)
 Controlled on senna BID with dietary modifications

## 2024-08-11 ENCOUNTER — Other Ambulatory Visit: Payer: Self-pay

## 2024-08-11 DIAGNOSIS — I10 Essential (primary) hypertension: Secondary | ICD-10-CM

## 2024-08-11 DIAGNOSIS — E782 Mixed hyperlipidemia: Secondary | ICD-10-CM

## 2024-08-11 MED ORDER — LOSARTAN POTASSIUM 100 MG PO TABS: 100.0000 mg | ORAL_TABLET | Freq: Every day | ORAL | 2 refills | Status: AC

## 2024-08-11 NOTE — Telephone Encounter (Signed)
 Rx refill received through our [facility] that sent to pharmacy for refills

## 2024-08-23 ENCOUNTER — Non-Acute Institutional Stay: Payer: Self-pay | Admitting: Nurse Practitioner

## 2024-08-23 DIAGNOSIS — M1991 Primary osteoarthritis, unspecified site: Secondary | ICD-10-CM

## 2024-08-23 DIAGNOSIS — G479 Sleep disorder, unspecified: Secondary | ICD-10-CM

## 2024-08-23 DIAGNOSIS — K5900 Constipation, unspecified: Secondary | ICD-10-CM

## 2024-08-23 DIAGNOSIS — I10 Essential (primary) hypertension: Secondary | ICD-10-CM

## 2024-08-23 DIAGNOSIS — F02A0 Dementia in other diseases classified elsewhere, mild, without behavioral disturbance, psychotic disturbance, mood disturbance, and anxiety: Secondary | ICD-10-CM

## 2024-08-23 DIAGNOSIS — C8599 Non-Hodgkin lymphoma, unspecified, extranodal and solid organ sites: Secondary | ICD-10-CM

## 2024-08-23 DIAGNOSIS — M810 Age-related osteoporosis without current pathological fracture: Secondary | ICD-10-CM

## 2024-08-23 NOTE — Assessment & Plan Note (Signed)
 Controlled on senna BID with dietary modifications

## 2024-08-23 NOTE — Assessment & Plan Note (Signed)
 Blood pressure controlled on losartan  and norvasc   Continue current medications and dietary modifications follow metabolic panel

## 2024-08-23 NOTE — Assessment & Plan Note (Signed)
 Stable on melatonin

## 2024-08-23 NOTE — Assessment & Plan Note (Signed)
 Controlled on tylenol  daily with PRN if needed

## 2024-08-23 NOTE — Assessment & Plan Note (Signed)
 mild, Continue donepezil  with supportive care.

## 2024-08-23 NOTE — Assessment & Plan Note (Signed)
 Continues to follow up with oncologist routinely under surveillance at this time.

## 2024-08-23 NOTE — Progress Notes (Signed)
 Location:  Other Nursing Home Room Number: AL Place of Service:  ALF (13)  Stephanie Davidson, Stephanie POUR, NP  Patient Care Team: Stephanie Stephanie POUR, NP as PCP - General (Geriatric Medicine) Rennie Cindy SAUNDERS, MD as Consulting Physician (Oncology) Stephanie Stephanie POUR, NP as Nurse Practitioner (Geriatric Medicine)  Extended Emergency Contact Information Primary Emergency Contact: Stephanie Davidson Address: [address]          [place], [place] [address] [place] Phone: [phone] Relation: Daughter Secondary Emergency Contact: Stephanie Davidson Address: [address] [place], [place] [address] [place] Mobile Phone: [phone] Relation: Son  Goals of care: Advanced Directive information    05/27/2024   11:29 AM  Advanced Directives  Does Patient Have a Medical Advance Directive? Yes  Type of Advance Directive Out of facility DNR (pink MOST or yellow form)  Does patient want to make changes to medical advance directive? No - Patient declined     Chief Complaint  Patient presents with   Medical Management of Chronic Issues    HPI:  Pt is a 89 y.o. female seen today for medical management of chronic disease.  She lives at [facility] in [place]. She reports she is doing well.  Reports she is eating 3 meals a day- reports it makes a difference when she does not have to cook and gets to eat with other people.  Weight has been stable   Reports she has not been on to sleep a lot- reports she goes to bed at 9 pm and makes herself stay in bed until 7-8am.   No issues with constipation or diarrhea.   Denies anxiety or depression.   Reports memory is okay- she has a calendar to keep up with appts and events.   Hx of breast cancer- followed by oncology   Osteoporosis- continues on fosamax - has been on since may 2024  [facility] with the assistance of walker.  No falls  Past Medical History:  Diagnosis Date   Alzheimer's dementia (HCC)     Breast cancer (HCC)    Lymphoma--- 2004 and again in 2020 (chemo and RT twice)   GERD (gastroesophageal reflux disease)    Hypertension    Personal history of chemotherapy    Personal history of radiation therapy    Sleep disturbance    Past Surgical History:  Procedure Laterality Date   BREAST BIOPSY Right ?   needle bx-benign   BREAST BIOPSY Left 05/12/2019   us  bx 12:00, venus marker, DIFFUSE LARGE B CELL LYMPHOMA   BREAST EXCISIONAL BIOPSY Left 2003   lymphoma   BREAST EXCISIONAL BIOPSY Left ?   lump was removed, benign   HIP ARTHROPLASTY Left 01/07/2022   Procedure: ARTHROPLASTY BIPOLAR HIP (HEMIARTHROPLASTY);  Surgeon: Tobie Priest, MD;  Location: [facility] ORS;  Service: Orthopedics;  Laterality: Left;   IR IMAGING GUIDED PORT INSERTION  06/02/2019   IR REMOVAL TUN ACCESS W/ PORT W/O FL MOD SED  11/04/2023    Allergies  Allergen Reactions   Latex Rash    Outpatient Encounter Medications as of 08/23/2024  Medication Sig   acetaminophen  (TYLENOL ) 325 MG tablet Take 650 mg by mouth every 4 (four) hours as needed.   acetaminophen  (TYLENOL ) 500 MG tablet Take 1,000 mg by mouth daily. And 2 by mouth every 4 hours as needed for discomfort or elevated temp   alendronate  (FOSAMAX ) 70 MG tablet TAKE 1 TABLET EVERY 7 DAYS WITH A  FULL GLASS OF WATER ON AN EMPTY STOMACH DO NOT LIE DOWN FOR AT LEAST 30 MIN   aluminum-magnesium hydroxide 200-200 MG/5ML suspension Take 5 mLs by mouth every 4 (four) hours as needed for indigestion.   amLODipine  (NORVASC ) 10 MG tablet TAKE 1 TABLET BY MOUTH DAILY   benzonatate (TESSALON) 100 MG capsule Take 100 mg by mouth 3 (three) times daily as needed for cough.   bismuth subsalicylate (PEPTO BISMOL) 262 MG/15ML suspension Take 10 mLs by mouth as needed.   carbamide peroxide (DEBROX) 6.5 % OTIC solution 5 drops daily as needed.   dextromethorphan-guaiFENesin (ROBITUSSIN-DM) 10-100 MG/5ML liquid Take 5 mLs by mouth every 4 (four) hours as needed for cough.    donepezil  (ARICEPT ) 10 MG tablet TAKE ONE TABLET BY MOUTH AT BEDTIME   fluticasone (FLONASE) 50 MCG/ACT nasal spray Place 2 sprays into both nostrils daily.   Glucose 15 GM/32ML GEL Take 1 packet by mouth as needed.   losartan  (COZAAR ) 100 MG tablet Take 1 tablet (100 mg total) by mouth at bedtime.   magnesium hydroxide (MILK OF MAGNESIA) 400 MG/5ML suspension 2 tbsp by mouth as needed for constipation supervised self administration daily  CALL M D if no relief 3 days of continued use   melatonin 5 MG TABS Take 5 mg by mouth at bedtime.   nystatin powder Apply 1 Application topically as needed (for skin breakdown).   ondansetron  (ZOFRAN ) 4 MG tablet Take 4 mg by mouth 3 (three) times daily as needed for nausea or vomiting.   SENNA-TIME 8.6 MG tablet TAKE 1 TABLET BY MOUTH 2 TIMES DAILY   No facility-administered encounter medications on file as of 08/23/2024.    Review of Systems  Constitutional:  Negative for activity change, appetite change, fatigue and unexpected weight change.  HENT:  Negative for congestion and hearing loss.   Eyes: Negative.   Respiratory:  Negative for cough and shortness of breath.   Cardiovascular:  Negative for chest pain, palpitations and leg swelling.  Gastrointestinal:  Negative for abdominal pain, constipation and diarrhea.  Genitourinary:  Negative for difficulty urinating and dysuria.  Musculoskeletal:  Negative for arthralgias and myalgias.  Skin:  Negative for color change and wound.  Neurological:  Negative for dizziness and weakness.  Psychiatric/Behavioral:  Negative for agitation, behavioral problems and confusion.     Immunization History  Administered Date(s) Administered   INFLUENZA, HIGH DOSE SEASONAL PF 07/10/2022   Influenza-Unspecified 07/11/2021, 07/11/2023   Moderna Covid-19 Fall Seasonal Vaccine 25yrs & older 12/31/2022, 01/02/2024   Moderna Sars-Covid-2 Vaccination 10/05/2019, 11/02/2019   PNEUMOCOCCAL CONJUGATE-20 03/04/2023    Pneumococcal Polysaccharide-23 01/26/2008, 03/14/2015   Tdap 02/12/2023   Unspecified SARS-COV-2 Vaccination 10/05/2019, 11/02/2019, 11/14/2020, 03/20/2021, 06/15/2021, 02/19/2022, 04/24/2022, 08/02/2022   Zoster Recombinant(Shingrix) 12/24/2017, 04/07/2018   Pertinent  Health Maintenance Due  Topic Date Due   Influenza Vaccine  04/23/2024   Mammogram  05/19/2025   Bone Density Scan  Completed      01/08/2022   10:00 AM 01/08/2022    7:30 PM 01/09/2022   11:32 AM 04/24/2022    1:19 PM 03/18/2024    1:25 PM  Fall Risk  Falls in the past year?     0  (RETIRED) Patient Fall Risk Level High fall risk  High fall risk  High fall risk  High fall risk       Data saved with a previous flowsheet row definition   Functional Status Survey:    Vitals:   08/23/24 1608  BP:  139/68  Pulse: 90  Resp: 20  Temp: (!) 97 F (36.1 C)  SpO2: 93%  Weight: 120 lb (54.4 kg)   Body mass index is 23.44 kg/m. Physical Exam Constitutional:      General: She is not in acute distress.    Appearance: She is well-developed. She is not diaphoretic.  HENT:     Head: Normocephalic and atraumatic.     Mouth/Throat:     Pharynx: No oropharyngeal exudate.  Eyes:     Conjunctiva/sclera: Conjunctivae normal.     Pupils: Pupils are equal, round, and reactive to light.  Cardiovascular:     Rate and Rhythm: Normal rate and regular rhythm.     Heart sounds: Normal heart sounds.  Pulmonary:     Effort: Pulmonary effort is normal.     Breath sounds: Normal breath sounds.  Abdominal:     General: Bowel sounds are normal.     Palpations: Abdomen is soft.  Musculoskeletal:        General: No tenderness.     Cervical back: Normal range of motion and neck supple.  Skin:    General: Skin is warm and dry.  Neurological:     Mental Status: She is alert and oriented to person, place, and time.     Labs reviewed: Recent Labs    08/28/23 0000 10/24/23 1004 04/23/24 0948  NA 137 134* 134*  K 4.0 3.8 4.2   CL 100 102 101  CO2 28* 25 26  GLUCOSE  --  125* 115*  BUN 19 20 14   CREATININE 0.7 0.60 0.70  CALCIUM 9.6 9.1 9.5   Recent Labs    10/24/23 1004 04/23/24 0948  AST 20 21  ALT 13 13  ALKPHOS 96 109  BILITOT 0.5 0.6  PROT 7.2 7.7  ALBUMIN 4.0 4.3   Recent Labs    10/24/23 1004 04/23/24 0948  WBC 6.5 7.4  NEUTROABS 4.1 4.6  HGB 12.0 12.7  HCT 36.8 39.4  MCV 91.5 92.9  PLT 281 158   No results found for: TSH No results found for: HGBA1C No results found for: CHOL, HDL, LDLCALC, LDLDIRECT, TRIG, CHOLHDL  Significant Diagnostic Results in last 30 days:  No results found.  Assessment/Plan Sleep disturbance Stable on melatonin  Primary osteoarthritis Controlled on tylenol  daily with PRN if needed  Hypertension Blood pressure controlled on losartan  and norvasc   Continue current medications and dietary modifications follow metabolic panel   Dementia (HCC) mild, Continue donepezil  with supportive care.  Constipation Controlled on senna BID with dietary modifications  Age-related osteoporosis without current pathological fracture Started on fosamax  in 2024 Add vit D 2000 units daily  Continue at this time with weight bearing exercises.   Lymphoma of breast (HCC) Continues to follow up with oncologist routinely under surveillance at this time.      Stephanie Davidson [facility] [phone]

## 2024-08-23 NOTE — Assessment & Plan Note (Signed)
 Started on fosamax  in 2024 Add vit D 2000 units daily  Continue at this time with weight bearing exercises.

## 2024-10-01 ENCOUNTER — Other Ambulatory Visit: Payer: Self-pay

## 2024-10-01 MED ORDER — AMLODIPINE BESYLATE 10 MG PO TABS: 10.0000 mg | ORAL_TABLET | Freq: Every day | ORAL | 1 refills | Status: AC

## 2024-10-01 NOTE — Telephone Encounter (Signed)
 Rx Refill was sent to pharmacy as requested

## 2025-04-25 ENCOUNTER — Ambulatory Visit: Admitting: Internal Medicine

## 2025-04-25 ENCOUNTER — Other Ambulatory Visit
